# Patient Record
Sex: Male | Born: 1955
Health system: Southern US, Community
[De-identification: ages and names within clinical notes are randomized; demographics above are authoritative.]

## PROBLEM LIST (undated history)

## (undated) DIAGNOSIS — I2510 Atherosclerotic heart disease of native coronary artery without angina pectoris: Secondary | ICD-10-CM

## (undated) DIAGNOSIS — G8929 Other chronic pain: Secondary | ICD-10-CM

## (undated) DIAGNOSIS — G43109 Migraine with aura, not intractable, without status migrainosus: Principal | ICD-10-CM

## (undated) DIAGNOSIS — F419 Anxiety disorder, unspecified: Secondary | ICD-10-CM

## (undated) DIAGNOSIS — M545 Low back pain, unspecified: Secondary | ICD-10-CM

## (undated) DIAGNOSIS — I255 Ischemic cardiomyopathy: Secondary | ICD-10-CM

## (undated) DIAGNOSIS — I10 Essential (primary) hypertension: Secondary | ICD-10-CM

## (undated) HISTORY — DX: Essential (primary) hypertension: I10

## (undated) HISTORY — DX: Atherosclerotic heart disease of native coronary artery without angina pectoris: I25.10

## (undated) HISTORY — DX: Other chronic pain: G89.29

## (undated) HISTORY — DX: Low back pain, unspecified: M54.50

## (undated) HISTORY — DX: Low back pain: M54.5

## (undated) HISTORY — DX: Anxiety disorder, unspecified: F41.9

## (undated) HISTORY — PX: BRAIN SURGERY: SHX531

## (undated) HISTORY — DX: Ischemic cardiomyopathy: I25.5

## (undated) HISTORY — PX: OTHER SURGICAL HISTORY: SHX169

## (undated) HISTORY — DX: Migraine with aura, not intractable, without status migrainosus: G43.109

## (undated) HISTORY — PX: SPINAL CORD STIMULATOR IMPLANT: SHX2422

---

## 2001-02-01 ENCOUNTER — Ambulatory Visit (HOSPITAL_COMMUNITY): Admission: RE | Admit: 2001-02-01 | Discharge: 2001-02-01 | Payer: Self-pay | Admitting: Neurosurgery

## 2001-02-01 ENCOUNTER — Encounter: Payer: Self-pay | Admitting: Neurosurgery

## 2001-02-24 ENCOUNTER — Encounter: Payer: Self-pay | Admitting: Neurosurgery

## 2001-02-24 ENCOUNTER — Ambulatory Visit (HOSPITAL_COMMUNITY): Admission: RE | Admit: 2001-02-24 | Discharge: 2001-02-24 | Payer: Self-pay | Admitting: Neurosurgery

## 2001-03-10 ENCOUNTER — Encounter: Payer: Self-pay | Admitting: Neurosurgery

## 2001-03-10 ENCOUNTER — Ambulatory Visit (HOSPITAL_COMMUNITY): Admission: RE | Admit: 2001-03-10 | Discharge: 2001-03-10 | Payer: Self-pay | Admitting: Neurosurgery

## 2001-03-23 ENCOUNTER — Encounter: Payer: Self-pay | Admitting: Neurosurgery

## 2001-03-23 ENCOUNTER — Ambulatory Visit (HOSPITAL_COMMUNITY): Admission: RE | Admit: 2001-03-23 | Discharge: 2001-03-23 | Payer: Self-pay | Admitting: Neurosurgery

## 2001-03-29 ENCOUNTER — Ambulatory Visit (HOSPITAL_COMMUNITY): Admission: RE | Admit: 2001-03-29 | Discharge: 2001-03-30 | Payer: Self-pay | Admitting: Neurosurgery

## 2001-04-07 ENCOUNTER — Encounter: Payer: Self-pay | Admitting: Neurosurgery

## 2001-04-07 ENCOUNTER — Ambulatory Visit (HOSPITAL_COMMUNITY): Admission: RE | Admit: 2001-04-07 | Discharge: 2001-04-08 | Payer: Self-pay | Admitting: Neurosurgery

## 2001-04-29 ENCOUNTER — Ambulatory Visit (HOSPITAL_COMMUNITY): Admission: RE | Admit: 2001-04-29 | Discharge: 2001-04-29 | Payer: Self-pay | Admitting: Neurosurgery

## 2001-04-29 ENCOUNTER — Encounter: Payer: Self-pay | Admitting: Neurosurgery

## 2001-05-09 ENCOUNTER — Emergency Department (HOSPITAL_COMMUNITY): Admission: EM | Admit: 2001-05-09 | Discharge: 2001-05-09 | Payer: Self-pay | Admitting: Emergency Medicine

## 2001-05-18 ENCOUNTER — Encounter: Payer: Self-pay | Admitting: Neurosurgery

## 2001-05-18 ENCOUNTER — Inpatient Hospital Stay (HOSPITAL_COMMUNITY): Admission: RE | Admit: 2001-05-18 | Discharge: 2001-05-19 | Payer: Self-pay | Admitting: Neurosurgery

## 2001-09-01 ENCOUNTER — Encounter (HOSPITAL_COMMUNITY): Admission: RE | Admit: 2001-09-01 | Discharge: 2001-10-01 | Payer: Self-pay | Admitting: Neurosurgery

## 2001-09-04 ENCOUNTER — Ambulatory Visit (HOSPITAL_COMMUNITY): Admission: RE | Admit: 2001-09-04 | Discharge: 2001-09-04 | Payer: Self-pay | Admitting: Neurosurgery

## 2001-09-04 ENCOUNTER — Encounter: Payer: Self-pay | Admitting: Neurosurgery

## 2001-10-01 ENCOUNTER — Encounter (HOSPITAL_COMMUNITY): Admission: RE | Admit: 2001-10-01 | Discharge: 2001-10-31 | Payer: Self-pay | Admitting: Neurosurgery

## 2002-01-18 ENCOUNTER — Encounter: Payer: Self-pay | Admitting: Family Medicine

## 2002-01-18 ENCOUNTER — Ambulatory Visit (HOSPITAL_COMMUNITY): Admission: RE | Admit: 2002-01-18 | Discharge: 2002-01-18 | Payer: Self-pay | Admitting: Family Medicine

## 2002-02-10 ENCOUNTER — Encounter: Payer: Self-pay | Admitting: Emergency Medicine

## 2002-02-10 ENCOUNTER — Emergency Department (HOSPITAL_COMMUNITY): Admission: EM | Admit: 2002-02-10 | Discharge: 2002-02-10 | Payer: Self-pay | Admitting: Emergency Medicine

## 2002-04-19 ENCOUNTER — Encounter: Payer: Self-pay | Admitting: Anesthesiology

## 2002-04-19 ENCOUNTER — Ambulatory Visit (HOSPITAL_COMMUNITY): Admission: RE | Admit: 2002-04-19 | Discharge: 2002-04-19 | Payer: Self-pay | Admitting: Anesthesiology

## 2002-12-06 ENCOUNTER — Ambulatory Visit (HOSPITAL_COMMUNITY): Admission: RE | Admit: 2002-12-06 | Discharge: 2002-12-06 | Payer: Self-pay | Admitting: Anesthesiology

## 2002-12-06 ENCOUNTER — Encounter: Payer: Self-pay | Admitting: Anesthesiology

## 2004-04-18 ENCOUNTER — Ambulatory Visit (HOSPITAL_COMMUNITY): Admission: RE | Admit: 2004-04-18 | Discharge: 2004-04-18 | Payer: Self-pay | Admitting: Anesthesiology

## 2006-09-09 ENCOUNTER — Ambulatory Visit (HOSPITAL_COMMUNITY): Admission: RE | Admit: 2006-09-09 | Discharge: 2006-09-09 | Payer: Self-pay | Admitting: Family Medicine

## 2007-10-06 ENCOUNTER — Inpatient Hospital Stay (HOSPITAL_COMMUNITY): Admission: EM | Admit: 2007-10-06 | Discharge: 2007-10-09 | Payer: Self-pay | Admitting: Emergency Medicine

## 2007-10-15 ENCOUNTER — Ambulatory Visit (HOSPITAL_COMMUNITY): Admission: RE | Admit: 2007-10-15 | Discharge: 2007-10-15 | Payer: Self-pay | Admitting: Family Medicine

## 2007-10-16 ENCOUNTER — Emergency Department (HOSPITAL_COMMUNITY): Admission: EM | Admit: 2007-10-16 | Discharge: 2007-10-16 | Payer: Self-pay | Admitting: Emergency Medicine

## 2007-10-17 ENCOUNTER — Emergency Department (HOSPITAL_COMMUNITY): Admission: EM | Admit: 2007-10-17 | Discharge: 2007-10-17 | Payer: Self-pay | Admitting: Emergency Medicine

## 2008-02-02 ENCOUNTER — Emergency Department (HOSPITAL_COMMUNITY): Admission: EM | Admit: 2008-02-02 | Discharge: 2008-02-02 | Payer: Self-pay | Admitting: Emergency Medicine

## 2008-02-05 ENCOUNTER — Emergency Department (HOSPITAL_COMMUNITY): Admission: EM | Admit: 2008-02-05 | Discharge: 2008-02-05 | Payer: Self-pay | Admitting: Emergency Medicine

## 2008-06-05 ENCOUNTER — Ambulatory Visit (HOSPITAL_COMMUNITY): Admission: RE | Admit: 2008-06-05 | Discharge: 2008-06-05 | Payer: Self-pay | Admitting: General Surgery

## 2009-07-23 ENCOUNTER — Encounter (HOSPITAL_COMMUNITY): Admission: RE | Admit: 2009-07-23 | Discharge: 2009-08-22 | Payer: Self-pay | Admitting: Anesthesiology

## 2009-08-31 ENCOUNTER — Ambulatory Visit (HOSPITAL_COMMUNITY): Admission: RE | Admit: 2009-08-31 | Discharge: 2009-08-31 | Payer: Self-pay | Admitting: Anesthesiology

## 2009-11-14 HISTORY — PX: COLONOSCOPY: SHX174

## 2010-11-04 ENCOUNTER — Ambulatory Visit (HOSPITAL_COMMUNITY): Admission: RE | Admit: 2010-11-04 | Discharge: 2010-11-04 | Payer: Self-pay | Admitting: Neurosurgery

## 2011-02-25 LAB — CREATININE, SERUM
Creatinine, Ser: 0.94 mg/dL (ref 0.4–1.5)
GFR calc non Af Amer: >60 mL/min (ref >60)

## 2011-04-29 NOTE — Procedures (Signed)
NAME:  Jim Martin, Jim Martin                ACCOUNT NO.:  [id number]   MEDICAL RECORD NO.:  [id number]          PATIENT TYPE:  INP   LOCATION:  A201                          FACILITY:  [facility]   PHYSICIAN:  Kofi A. Gerilyn Pilgrim, M.D. DATE OF BIRTH:  May 25, 1956   DATE OF PROCEDURE:  DATE OF DISCHARGE:  10/09/2007                              EEG INTERPRETATION   55 year old man who presents with a spell of unresponsiveness,  suspicious for unwitnessed seizure.   MEDICATIONS:  Sodium chloride, Rocephin, Zithromax, Vicodin, Percocet.   ANALYSIS:  A 16-channel recording is conduced for 22 minutes.  There is  a well-formed posterior rhythm of 8 Hz which attenuates with eye-  opening.  There is beta activity observed in the frontal areas.  Sleep  and drowsy activities are recorded.  Photic stimulation is not  conducted, but hyperventilation is, and shows no significant changes in  the background activity.  There is no focal or lateralized slowing.  There is no epileptiform activity observed.   IMPRESSION:  This is a normal recording of the wake and drowsy states.      Kofi A. Gerilyn Pilgrim, M.D.  Electronically Signed     KAD/MEDQ  D:  10/11/2007  T:  10/11/2007  Job:  [id number]

## 2011-04-29 NOTE — Group Therapy Note (Signed)
NAME:  JANTZ, MAIN                ACCOUNT NO.:  [id number]   MEDICAL RECORD NO.:  [id number]          PATIENT TYPE:  INP   LOCATION:  A201                          FACILITY:  [facility]   PHYSICIAN:  Angus G. Renard Matter, MD   DATE OF BIRTH:  [birth year]   DATE OF PROCEDURE:  DATE OF DISCHARGE:                                 PROGRESS NOTE   This patient is felt to have bronchopneumonia with a history of  hypothermia, history of leukocytosis.  His condition remains stable.   OBJECTIVE:  VITAL SIGNS:  Blood pressure 114/57, respiration 24, pulse  70, temperature 97.4.  Lab data, CBC WBC 9200, hemoglobin 12.1,  hematocrit 35.1.  Chemistries otherwise normal with the exception of  slight elevation of glucose 134.  Chest x-ray on admission showed  elevation right diaphragm, probable infiltrate.  MRI of brain showed  evidence of acute left maxillary sinusitis.  HEENT:  Negative.  LUNGS:  Clear.  HEART:  Regular rhythm.  ABDOMEN:  No palpable organs or masses.   ASSESSMENT:  The patient was admitted with altered mental status.  Was  found to have possible pneumonia with elevated white count.   PLAN:  To continue current regimen.  The patient is scheduled for EEG  ordered by Dr. Gerilyn Pilgrim.  Will repeat chest x-ray.      Angus G. Renard Matter, MD  Electronically Signed     AGM/MEDQ  D:  10/08/2007  T:  10/08/2007  Job:  [id number]

## 2011-04-29 NOTE — Consult Note (Signed)
NAME:  Jim Martin, Jim Martin                ACCOUNT NO.:  [id number]   MEDICAL RECORD NO.:  [id number]          PATIENT TYPE:  INP   LOCATION:  A201                          FACILITY:  [facility]   PHYSICIAN:  Kofi A. Gerilyn Pilgrim, M.D. DATE OF BIRTH:  Jan 27, 1956   DATE OF CONSULTATION:  10/07/2007  DATE OF DISCHARGE:                                 CONSULTATION   REASON FOR CONSULTATION:  Altered mental status.   HISTORY:  This is a 55 year old white male who is known to our clinic  for low back pain.  He was found unresponsive and unconscious on his  porch and was admitted to the hospital.  He is noted to have a  pneumonia.  The patient is back to baseline and indicates that he did  not take more of his medication than prescribed, although, this has not  been independently verified.  He indicates that he was out doing some  work around the yard and had increased back pain.  He went to sit down  and when he woke up he was in the hospital.  He was found unresponsive  and unconscious by his brother.  He denies again taking more medication  than prescribed.  The patient has been followed in the clinic for the  last several months.  He has been on MS Contin 30 mg b.i.d. and Lortab  7.5 mg t.i.d. p.r.n.  He has been on this dose since June 12, when the  hydrocodone was increased from b.i.d. dosing to t.i.d. dosing.  Although  home medications are Naproxen, Lexapro, and Xanax.   PAST MEDICAL HISTORY:  Significant for hypertension, lumbar degenerative  disk disease, lumbar spinal leak after laminectomy surgery.   PAST SURGICAL HISTORY:  Right L4-5 laminotomy and foraminotomy, also  right L4-5 laminectomy and repair of dural leak.   MEDICATIONS:  1. Xanax 1 mg daily.  2. MS Contin 30 mg b.i.d.  3. Lortab 7.5 mg t.i.d. p.r.n.  4. Lexapro 20 mg daily.   ALLERGIES:  No known drug allergies.   FAMILY HISTORY:  Significant for coronary artery disease.   SOCIAL HISTORY:  She smokes a pack of cigarettes  per day.  No alcohol or  illicit drug use.  He lives with his two children who he apparently has  primary responsibility for.   REVIEW OF SYSTEMS:  Significant for history of present illness,  otherwise unrevealing.  No reports of headaches or focal neurological  symptoms.  The patient does report having episode leg weakness which  seems to be associated with breakthrough pain in the low back region.   PHYSICAL EXAMINATION:  VITAL SIGNS:  Temperature 97.1, pulse 79,  respirations 18, blood pressure 103/57.  HEENT:  Neck is supple.  Head is normocephalic and atraumatic.  ABDOMEN:  Soft.  EXTREMITIES:  No edema.  MENTATION:  The patient is awake and alert.  He converses well.  Speech,  language, and cognition are intact.  CRANIAL NERVES:  Pupils equal, round, and reactive to light and  accommodation.  The pupil size is 5 mm.  The extraocular movements are  full,  although the patient does have significant sustained nystagmus  bilaterally.  In reviewing examination notes from the office, this does  not appear to be present on previous examinations.  MOTOR:  Shows normal tone, bulk, and strength.  There is no pronator  drift.  Coordination shows no dysmetria, there are no tremors,  bradykinesia, or pass pointing.  Deep tendon reflexes are brisk in the  legs with equivocal plantar reflexes.  Sensation is normal to  temperature and light touch.   Head CT scan is normal.   LABORATORY DATA:  WBC 27, hemoglobin 14.8, platelet count 250.  Differential; 92% neutrophils.  Urinalysis; 0-2 RBC's, 0-2 WBC's,  nitrites negative, leukocytes negative, few bacteria.  Acetaminophen,  alcohol, and aspirin levels are undetected.  Urine drug screen is  positive for metabolite of opioids.   ASSESSMENT:  Acute encephalopathy which seemed to have resolved  spontaneously.  Differential diagnosis includes drug overdose  unintentional given the sustained nystagmus on examination.  Given the  rapid recovery,  seizures should also be considered.   RECOMMENDATIONS:  1. EEG.  I agree with MRI of the brain.  2. The patient should have a pill count of his medications.  They were      prescribed on the last visit, which was October 8.  Further      disposition will depend upon the evaluation.  Thanks for this      consultation.      Kofi A. Gerilyn Pilgrim, M.D.  Electronically Signed     KAD/MEDQ  D:  10/07/2007  T:  10/07/2007  Job:  [id number]

## 2011-04-29 NOTE — Group Therapy Note (Signed)
NAME:  Jim Martin, Jim Martin                ACCOUNT NO.:  [id number]   MEDICAL RECORD NO.:  [id number]          PATIENT TYPE:  INP   LOCATION:  A201                          FACILITY:  [facility]   PHYSICIAN:  Angus G. Renard Matter, MD   DATE OF BIRTH:  1956-01-17   DATE OF PROCEDURE:  DATE OF DISCHARGE:  10/09/2007                                 PROGRESS NOTE   This patient was felt to have bronchopneumonia.  Does have of note  evidence of hypothermia, a leukocytosis and encephalopathy.  There was  some concern that this might have been secondary to medications.  His  initial CBC showed a white count of 27,300 with hemoglobin 14.8,  hematocrit of 43.7.  The most recent CBC, WBC 9200 with hemoglobin 12.1  hematocrit 35.1.  Blood cultures have been negative. A repeat chest x-  ray shows improvement and partial clearing of the right lung base,  evidence of bronchitic chain changes.   OBJECTIVE:  VITAL SIGNS:  Blood pressure 135/84, respiration 20, pulse  65, temperature 97.6. HEART:  Regular rhythm  LUNGS:  Clear.  ABDOMEN:  No palpable organs or masses   ASSESSMENT:  The patient was admitted with above-stated problems. An EEG  was ordered by Dr. Gerilyn Pilgrim. If the diagnostic test has been completed,  we can consider discharge.      Angus G. Renard Matter, MD  Electronically Signed     AGM/MEDQ  D:  10/09/2007  T:  10/10/2007  Job:  [id number]

## 2011-04-29 NOTE — H&P (Signed)
NAME:  Jim Martin, Jim Martin                ACCOUNT NO.:  [id number]   MEDICAL RECORD NO.:  [id number]          PATIENT TYPE:  INP   LOCATION:  A201                          FACILITY:  [facility]   PHYSICIAN:  Angus G. Renard Matter, MD   DATE OF BIRTH:  1956/09/05   DATE OF ADMISSION:  10/06/2007  DATE OF DISCHARGE:  LH                              HISTORY & PHYSICAL   This patient was brought in through the emergency department with  altered mental status and was evaluated by ED physician and found to  have evidence of pneumonia.  The patient had been sitting on his front  porch, was not responding, was given Narcan in the ambulance on the way  to the ER, did improve some.  He had been cold all day.  Apparently had  some difficulty breathing and was given CPR prior to admission.  He is  being treated for possible MS, although no definite diagnosis has been  made.   Laboratory studies done:  CBC - WBC 27,300 with hemoglobin of 14.8,  hematocrit 43.7.  X-ray showed elevated right diaphragm, infiltrate in  right lower lobe.  Drug screen positive for opiates.  Chemistries:  Sodium 140, potassium 4.3, chloride 105, CO2 30, glucose 134, BUN 15,  creatinine 1.29.  CT of the head showed no intracranial abnormalities.  The patient was admitted.   SOCIAL HISTORY:  The patient does smoke cigarettes, does not drink or  use drugs.   FAMILY HISTORY:  Unknown.   PAST MEDICAL HISTORY:  1. History of hypertension.  2. Arthritis.  3. Chronic back pain.   MEDICATION LIST:  Morphine hydrocodone.   REVIEW OF SYSTEMS:  HEENT:  Negative.  CARDIOPULMONARY:  Negative.  No  cough or dyspnea.  GASTROINTESTINAL:  No gastrointestinal bleeding,  diarrhea, constipation.  GENITOURINARY:  No dysuria or hematuria.  NEUROLOGICAL:  The patient had mental confusion.   EXAMINATION:  Alert male with blood pressure 110/68, pulse 88,  respirations 18, temperature 96.1.  HEENT:  Eyes: PERRLA.  TMs negative.  Oropharynx benign.  NECK:  Supple.  No JVD or thyroid abnormalities.  HEART:  Regular rhythm, no murmurs.  ABDOMEN:  No palpable organs or masses.  No organomegaly.  EXTREMITIES:  Free of edema.  NEUROLOGIC: No focal deficit.   ASSESSMENT:  Patient was admitted with bronchopneumonia, leukocytosis,  history of hypothermia.      Angus G. Renard Matter, MD  Electronically Signed     AGM/MEDQ  D:  10/07/2007  T:  10/07/2007  Job:  [id number]

## 2011-04-29 NOTE — Group Therapy Note (Signed)
NAME:  Jim Martin, Jim Martin                ACCOUNT NO.:  [id number]   MEDICAL RECORD NO.:  [id number]          PATIENT TYPE:  INP   LOCATION:  A201                          FACILITY:  [facility]   PHYSICIAN:  Kofi A. Gerilyn Pilgrim, M.D. DATE OF BIRTH:  [birth year]   DATE OF PROCEDURE:  10/08/2007  DATE OF DISCHARGE:                                 PROGRESS NOTE   SUBJECTIVE:  The patient complains of pain being 11/10, quite severe.  We were able to do a pill count and the patient got his medication,  hydrocodone filled on the 9th, but only has 17 left over, which  indicates that the patient was obviously taking more than prescribed or  that the medications were stolen or lost.  The morphine and other  medications are appropriate for the date written.  The patient has been  started on Percocet.   OBJECTIVE:  VITAL SIGNS:  On exam today temperature is 97.3, pulse 52,  respirations 20, blood pressure 154/77.  GENERAL APPEARANCE:  The patient is alert and in no acute distress.  He  does appear to be in significant discomfort from pain.   ASSESSMENT/PLAN:  Unresponsiveness, likely due to the patient taking too  much medication.  When confronted with the evidence of his pill count,  the patient did admit to taking more of the hydrocodone.  The patient  was strictly warned against doing this and warned about the potential  complications such as overdose, death.  We will discontinue all the  short-acting pain medications and replace them with Ultram.  We will go  ahead and restart the long-acting MS Contin.  An EEG has been ordered  but so far has not been done.  The results will be followed up.  It is a  possibility that this could be done in the outpatient setting.      Kofi A. Gerilyn Pilgrim, M.D.  Electronically Signed     KAD/MEDQ  D:  10/08/2007  T:  10/08/2007  Job:  [id number]

## 2011-05-02 NOTE — Discharge Summary (Signed)
NAME:  Jim Martin, Jim Martin                ACCOUNT NO.:  [id number]   MEDICAL RECORD NO.:  [id number]          PATIENT TYPE:  INP   LOCATION:  A201                          FACILITY:  [facility]   PHYSICIAN:  Angus G. Renard Matter, MD   DATE OF BIRTH:  1956-09-02   DATE OF ADMISSION:  10/06/2007  DATE OF DISCHARGE:  10/25/2008LH                               DISCHARGE SUMMARY   A 55 year old white male admitted October 06, 2007, discharged October 09, 2007, three days' hospitalization.   DIAGNOSES:  1. Bronchopneumonia  2. Leukocytosis  3. Hypothermia.   Patient's condition is stable at time of his discharge.   The patient was brought to the emergency apartment with altered mental  status, was evaluated by ED physician and found to have evidence of  pneumonia.  The patient had been sitting on his front porch and was not  responding.  He was given Narcan in the ambulance on the way to the ED  and did improve some. He had been cold all day, apparently had some  difficulty breathing. Was given CPR prior to admission.  He is being  treated for possible MS as well, although no definite diagnosis has been  made.   PHYSICAL EXAMINATION:  GENERAL:  Alert white male.  VITAL SIGNS:  Blood pressure 110/68, pulse 88, respiration 18,  temperature 96.1.  HEENT:  Eyes: PERRLA.  TMs negative.  Oropharynx benign.  NECK:  Supple.  No JVD or thyroid abnormalities.  HEART:  Regular rhythm, no murmurs.  ABDOMEN:  No palpable organs or masses.  No organomegaly.  EXTREMITIES:  Free of edema.   LABORATORY DATA:  Admission CBC:  WBC 27,300 with hemoglobin 14.8,  hematocrit 43.7,  92 neutrophils, 25.2 absolute neutrophils, 6  lymphocytes.  Chemistries:  Sodium 140, potassium 4.3, chloride 105, CO2  30, glucose 134, BUN 15, creatinine 1.29.  Liver enzymes: SGOT 3.8, SGPT  35, alkaline phosphatase 25. Urine drug screen negative.  Urinalysis  negative.  Blood culture negative over a 2-day period.   Chest x-ray:  Elevation of right diaphragm with atelectasis versus  infiltrate right medial lower lobe.   CT of head:  No acute intracranial abnormalities.  Left maxillary  sinusitis.   MRI of brain without contrast:  Acute left maxillary sinusitis.   Subsequent x-ray of chest showed partial clearing   HOSPITAL COURSE:  The patient, at the time of his admission, was placed  on a regular diet, [facility] standing orders.  MRI of the head was  scheduled.  Was placed on IV half-normal saline at 125 mL an hour, IV  Rocephin 1 gram daily, Zithromax 500 mg daily, Lortab 7.5/500 every 6  hours p.r.n. for pain.  The patient was seen in consultation by Dr.  Gerilyn Pilgrim.  EEG was ordered.  The patient was placed on MS Contin 30 mg  b.i.d. by Dr. Gerilyn Pilgrim.  Subsequent x-ray of chest was negative.   Neurologist felt he could have had an overdose of medication and  encephalopathy secondary to this.  At any rate, his chest x-ray  improved, and he was able to  be discharged after three days'  hospitalization. He was discharged on:  1. Levaquin 750 mg daily.  2. Mucinex 1200 mg b.i.d.  3. MS Contin 30 mg b.i.d.  4. Ultram 100 mg every 6 hours.  5. Naproxen 500 mg daily.      Angus G. Renard Matter, MD  Electronically Signed     AGM/MEDQ  D:  10/19/2007  T:  10/19/2007  Job:  [id number]

## 2011-05-02 NOTE — Op Note (Signed)
[place]. [facility]  Patient:    Jim Martin, Jim Martin                       MRN: [id number] Proc. Date: 05/18/01 Adm. Date:  [id number] Attending:  Barton Fanny                           Operative Report  PREOPERATIVE DIAGNOSIS:  Delayed postlaminotomy cerebrospinal fluid leak.  POSTOPERATIVE DIAGNOSIS:  Delayed postlaminotomy cerebrospinal fluid leak.  OPERATION PERFORMED:  Right L4-5 lumbar laminotomy and repair of dural defect.  SURGEON:  Hewitt Shorts, M.D.  ASSISTANT:  ANESTHESIA:  General endotracheal.  INDICATIONS FOR PROCEDURE:  The patient is a 55 year old man who is six weeks status post right L4-5 lumbar laminotomy and foraminotomy.  He did well initially following surgery.  Subsequently developed some intermittent mild headache as well as subcutaneous fluid collection underlying his lumbar incision which healed well.  Symptoms continued and were not improving despite periods of bedrest and reduction of overall level of activity and a decision was made to proceed with exploration of his wound and repair of CSF leak.  DESCRIPTION OF PROCEDURE:  The patient was brought to the operating room and placed under general endotracheal anesthesia.  The patient was turn to a prone position and the lumbar region was prepped with Betadine soap and solution and draped in sterile fashion.  The previous midline incision was reopened. Dissection was carried down through the pseudomeningocele and through the opening within the previous fascial closure.  The microscope was draped and brought into the field to provide additional magnification, illumination and visualization.  The remainder of the procedure was performed using microdissection and microsurgical technique.  The previous right L4-5 lumbar laminotomy was exposed, granulation tissue within it was carefully removed and the underlying thecal sac was identified.  It was found that the dural  defect was underlying the superior edge of the laminotomy and therefore the laminotomy was extended rostrally using the [facility] Max drill and Kerrison punches.  We were able to define the defect which was approximately 2 to 2.5 mm in length.  A few small nerve rootlets were gently eased back into the dural tube and then the dural opening was closed with a figure of eight 6-0 Prolene suture.  A good closure was achieved, a Valsalva revealed no cerebrospinal fluid leakage.  We therefore went ahead and harvested a piece of paraspinal muscle which was macerated and placed over the dura repair and then this was sealed over with Tisseal.  We then proceeded with closure.  The deep fascia was closed with interrupted undyed 0 Vicryl sutures.  The subcutaneous and subcuticular layer were closed with interrupted inverted 2-0 undyed Vicryl sutures and the skin edges reapproximated with Dermabond.  The patient tolerated the procedure well.  Estimated blood loss for this procedure was less than 25 cc.  Sponge, needle and instrument counts were correct. DD:  05/18/01 TD:  05/19/01 Job: [id number]

## 2011-05-02 NOTE — Op Note (Signed)
[place]. [facility]  Patient:    Jim Martin, Jim Martin                         MRN: [id number] Proc. Date: 04/07/01 Adm. Date:  [id number] Attending:  Barton Fanny                           Operative Report  PREOPERATIVE DIAGNOSIS:  Lumbar spondylosis and radiculopathy.  POSTOPERATIVE DIAGNOSIS:  Lumbar spondylosis and radiculopathy.  PROCEDURE:  Right L4-5 lumbar laminotomy and foraminotomy.  SURGEON:  Hewitt Shorts, M.D.  ASSISTANT:  Danae Orleans. Venetia Maxon, M.D.  ANESTHESIA:  General endotracheal.  INDICATION:  This is a 55 year old man who presented with right lumbar radiculopathy and was found to have lumbar spondylosis with hypertrophic facet arthropathy causing dorsolateral right L5 nerve root compression.  Decision was made to proceed with lumbar laminotomy and foraminotomy.  DESCRIPTION OF PROCEDURE:  Patient brought to the operating room and placed under general endotracheal anesthesia.  The patient was turned to a prone position, and the lumbar region was prepped with Duraprep and draped in a sterile fashion.  A localizing x-ray was taken and the L4-5 level identified. The midline was infiltrated with local anesthetic with epinephrine and a midline incision made, carried down through the subcutaneous tissue.  Bipolar cautery and electrocautery were used to maintain hemostasis.  Dissection was carried down to the lumbar fascia, which was incised to the right of the midline, and the paraspinous muscles were dissected and the spinous processes and laminae in subperiosteal fashion.  Another localizing x-ray was taken. The L4-5 interlaminar space was identified.  The microscope was draped and brought into the field to provide additional magnification and illumination and visualization, and the remainder of the procedure was performed using microdissection and microsurgical technique.  A laminotomy was performed using the [facility] Max drill and  Kerrison punches.  The ligamentum flavum was thickened and was carefully removed, and we exposed the thecal sac and right L5 nerve root.  A foraminotomy was performed for the right L5 nerve root and overgrown facet and ligament tissue was removed, and the thecal sac and nerve root were decompressed.  The underlying L4-5 disk was examined.  It was degenerated, but there was no disk herniation.  It was felt that good decompression of the thecal sac and nerve root had been achieved.  The wound was irrigated with bacitracin solution, checked for hemostasis, which was established with bipolar cautery, and then 2 cc of fentanyl and 80 mg of Depo-Medrol were infused into the epidural space, and then we proceeded with closure.  The deep fascia was closed with interrupted, undyed 1 Vicryl sutures, the subcutaneous and subcuticular layer were closed with interrupted, inverted 2-0 undyed Vicryl sutures, and the skin edges were closed with Dermabond.  The patient tolerated the procedure well.  The estimated blood loss was 25 cc.  Sponge and needle count were correct.  Following surgery, the patient is to be turned back to a supine position and reversed from anesthetic, extubated, and transferred to the recovery room for further care. DD:  04/07/01 TD:  04/07/01 Job: [id number]

## 2011-05-02 NOTE — H&P (Signed)
[place]  Patient:    Jim Martin, Jim Martin                       MRN: [id number] Adm. Date:  [id number] Attending:  Barton Fanny                         History and Physical  CHIEF COMPLAINT/HISTORY OF PRESENT ILLNESS: The patient is a 55 year old left-handed white male, who is about six weeks status post a right L4-5 lumbar laminotomy and foraminotomy.  Following surgery he initially had some mild radicular discomfort but then a week or two after surgery began to develop some headaches.  Notably, he had been studied prior to surgery with a myelogram and developed a post myelogram headache and had been scheduled for surgery, which needed to be postponed.  He underwent a blood patch, which successfully resolved the post myelogram headache, and he subsequently underwent surgery a week later.  Also, he notably had prior to his myelogram a series of three epidural steroid injections as well as a facet injury.  A few days after developing the headache he began to notice some mild crossing of his eyes, which subsequently worsened.  We evaluated the patient and he was found to have a partial left sixth nerve palsy.  He denied diplopia with central and right lateral gaze and, in fact, did not notice diplopia with left lateral gaze but finds it hard to maintain left lateral gaze.  In examining his incision there was a subcutaneous fluid collection.  The patient was studied with MRI of the lumbar spine, which showed a connection between the subcutaneous fluid collection and the laminotomy with a small defect in the lumbodorsal fascia consistent with a dural defect and CSF leak.  The patient was treated with bed rest and I have been him several times in the office.  He has had some intermittent mild headache.  However, he has noticed that the pseudomeningocele certainly has not resolved and if anything is slightly increased in size.  He finds that it  tends to decrease in size when he is recumbent and increase in size when he is up and about, but even when he is up and about he only occasionally has headache.  Nevertheless, because of the persistent CSF leak the patient is admitted to the hospital for exploration of his wound and repair of the CSF leak.  Notably, the patient did undergo ophthalmic evaluation by Dr. Theone Murdoch at [facility], and his left sixth nerve palsy has steadily improved and gaze is nearly conjugate, and he rarely has any diplopia.  Dr. Daphine Deutscher expects the sixth nerve palsy to continue to resolve.  PAST MEDICAL/SURGICAL HISTORY/FAMILY HISTORY/SOCIAL HISTORY: Without change from his admission note of April 07, 2001.  REVIEW OF SYSTEMS: Notable for that described in his History of Present Illness and Past Medical History.  He also has had a bit of mucus production over the past weekend from the chest.  He denies any chest pain, significant increase in cough, or really much in the way of mucus production over the past 1-1/2 days.  He has had no fever and he has no fever at this time.  He has normal WBCs.  PHYSICAL EXAMINATION:  GENERAL: The patient is a well-developed, well-nourished white male in no acute distress.  VITAL SIGNS: Temperature 97.8 degrees, pulse 98, blood pressure 91/84, respiratory rate 16.  Height  5 feet 7 inches.  Weight 162 pounds.  LUNGS: Clear to auscultation with symmetric respiratory excursion.  HEART: Regular rate and rhythm, normal S1 and S2, no murmur.  ABDOMEN: Soft, nontender, bowel sounds present.  EXTREMITIES: No clubbing, cyanosis, or edema.  MUSCULOSKELETAL: Well-healed lumbar incision.  There was a subcutaneous fluid collection present consistent with pseudomeningocele.  NEUROLOGIC: Strength 5/5 in lower extremities.  Sensation intact to pinprick. Reflexes 1-2 and symmetric bilaterally.  Toes are downgoing bilaterally.  IMPRESSION: Delayed post  laminotomy cerebrospinal fluid leak associated with transient partial left sixth nerve palsy, which is now nearly fully resolved.  PLAN: The patient is to be admitted for exploration of his wound and repair of his CSF leak.  We discussed this and the nature of the surgical procedure, alternatives to surgery, typical length of surgery and hospital stay and overall recuperation, limitations postoperatively, and the risks of surgery including risk of infection, bleeding, nerve root dysfunction, recurrence of CSF leak and possible need for further surgery, and anesthetic risks.  He also was spoken with directly about the mild bronchitic condition that he has.  It was explained to him that with general anesthesia there is an increased risk of pneumonia and that if he were to have significant coughing it could disrupt the repair of his dural defect and require further surgery.  He feels that he really has had little in the way of productive cough over the past 1-1/2 days and that it was a few days ago that he brought up any mucus at all and it really was not that substantial, and he understand these risks of surgery and wants to go ahead with surgery at this time.  Understanding that the patient would like to go ahead with surgery he is admitted for such. DD:  05/18/01 TD:  05/19/01 Job: [id number]

## 2011-05-02 NOTE — H&P (Signed)
[place]  Patient:    Jim Martin, Jim Martin                         MRN: [id number] Adm. Date:  [id number] Attending:  Barton Fanny CC:         Hewitt Shorts, M.D. (office)   History and Physical  CHIEF COMPLAINT: The patient is a 55 year old left-handed white male, who was evaluated for right lumbar radiculopathy.  HISTORY OF PRESENT ILLNESS: He has been having intermittent back pain which has steadily worsened.  The pain has been across his low back, more to the right than to the left, with pain into the right buttock and posterior thigh. He does not describe any weakness, numbness, or paresthesias.  He has been treated with NSAIDs, muscle relaxants and analgesics without relief.  He was studied with an MRI scan and myelogram and post myelogram CT scan, and he as undergone a series of three epidural steroid injections, as well as a right L4-5 facet injection.  Each of these had partial transient beneficial effect but no lasting benefit.  He has continued to work but finds it difficult standing on concrete throughout the day, bending and lifting.  Myelogram and post myelogram CT scan shows nonfilling of the right L5 nerve root, primarily due to hypertrophic spondylitic spurring from the right L4-5 facet complex.  There is mild disk bulging at that level but the compression is more dorsal/lateral than ventral, and the patient is admitted now for right L4-5 lumbar laminotomy and foraminotomy.  Notably, following his myelogram he developed post myelogram syndrome, which responded well to a blood patch done last week (nine days ago).  PAST MEDICAL HISTORY: There is no history of hypertension, myocardial infarction, cancer, stroke, diabetes, peptic ulcer disease, or lung disease. PAST SURGICAL HISTORY: No previous surgeries.  ALLERGIES: No known drug allergies.  CURRENT MEDICATIONS:  1. Skelaxin.  2. Tylenol #3.  3. Vioxx.  FAMILY  HISTORY: His father died at the age of 77 of heart disease.  His mother is in poor health at age 74 with arthritis and lung disease - she is wheelchair-bound.  SOCIAL HISTORY: The patient is separated from his wife.  He smoked for five or six years in his early life but restarted smoking about eight months ago.  He does not drink alcoholic beverages.  He denies history of substance abuse.  REVIEW OF SYSTEMS: Notable for those difficulties described in his History of Present Illness and Past Medical History, but otherwise unremarkable.  PHYSICAL EXAMINATION:  GENERAL: He appears to be a well-developed, well-nourished white male in no acute distress.  VITAL SIGNS: Temperature 96.8 degrees, pulse 90, blood pressure 152/96, respiratory rate 18.  Height 5 feet 7 inches.  Weight 162 pounds.  LUNGS: Clear to auscultation.  Symmetric respiratory excursion.  HEART: Regular rate and rhythm, normal S1 and S2; no murmur.  ABDOMEN: Soft, nontender.  Bowel sounds present.  EXTREMITIES: No clubbing, cyanosis, or edema.  MUSCULOSKELETAL: No tenderness to palpation over the lumbar spinous process or paralumbar musculature.  Straight leg raising negative bilaterally.  He is able to flex 90 degrees and able to extend well.  NEUROLOGIC: Strength 5/5 in lower extremities including dorsiflexion, plantar flexion, and extensor hallucis longus.  Sensation is intact to pinprick throughout the lower extremities.  Reflexes are 1-2 at the quadriceps and gastrocnemius and symmetric bilaterally.  Toes are downgoing bilaterally.  IMPRESSION: Right lumbar radiculopathy secondary to right  L5 nerve root compression.  PLAN: The patient is admitted for right L4-5 lumbar laminotomy and foraminotomy.  We doubt necessity for diskectomy.  We discussed the nature of the procedure, typical length of surgery and hospital stay and overall recuperation, his limitations during the postoperative period including risks of  infection, bleeding, possible need for transfusion, risk of nerve dysfunction with pain, weakness, numbness, or paresthesias, the risk of recurrent spondylitic degeneration and neural compression with possible need for further surgery, and anesthetic risks of myocardial infarction, stroke, pneumonia, and death, all of which are increased due to his significant smoking history.  He also understands the alternative to the surgery is living with the symptoms as they are and treating them with NSAIDs as necessary. Understanding all this he does wish to proceed with surgery and is admitted for such. DD:  04/07/01 TD:  04/07/01 Job: [id number]

## 2011-09-05 LAB — CULTURE, ROUTINE-ABSCESS

## 2011-09-24 LAB — DIFFERENTIAL
Basophils Absolute: 0
Eosinophils Absolute: 0
Eosinophils Relative: 1
Lymphocytes Relative: 22
Lymphocytes Relative: 39
Lymphocytes Relative: 6 — ABNORMAL LOW
Lymphs Abs: 2.7
Lymphs Abs: 3.6 — ABNORMAL HIGH
Monocytes Absolute: 0.5
Monocytes Relative: 2 — ABNORMAL LOW
Monocytes Relative: 6
Neutro Abs: 25.2 — ABNORMAL HIGH
Neutro Abs: 9.3 — ABNORMAL HIGH
Neutrophils Relative %: 92 — ABNORMAL HIGH
Smear Review: ADEQUATE

## 2011-09-24 LAB — CULTURE, BLOOD (ROUTINE X 2)
Culture: NO GROWTH
Report Status: 10272008
Report Status: 10272008

## 2011-09-24 LAB — ETHANOL: Alcohol, Ethyl (B): <5

## 2011-09-24 LAB — CBC
HCT: 35.1 — ABNORMAL LOW
Hemoglobin: 12.1 — ABNORMAL LOW
Hemoglobin: 12.6 — ABNORMAL LOW
Hemoglobin: 14.8
MCHC: 33.8
MCV: 92.8
Platelets: 243
Platelets: 250
RBC: 4.71
RDW: 13.1
WBC: 12.7 — ABNORMAL HIGH
WBC: 27.3 — ABNORMAL HIGH
WBC: 9.2

## 2011-09-24 LAB — URINALYSIS, ROUTINE W REFLEX MICROSCOPIC
Ketones, ur: NEGATIVE
Nitrite: NEGATIVE
Protein, ur: NEGATIVE
Specific Gravity, Urine: 1.025
Urobilinogen, UA: 0.2
pH: 5.5

## 2011-09-24 LAB — COMPREHENSIVE METABOLIC PANEL
ALT: 25
Albumin: 3.8
Alkaline Phosphatase: 82
Calcium: 9.7
GFR calc non Af Amer: 59 — ABNORMAL LOW
Glucose, Bld: 134 — ABNORMAL HIGH
Total Protein: 6.6

## 2011-09-24 LAB — URINE MICROSCOPIC-ADD ON

## 2011-09-24 LAB — SALICYLATE LEVEL: Salicylate Lvl: <4

## 2011-09-24 LAB — RAPID URINE DRUG SCREEN, HOSP PERFORMED: Amphetamines: NOT DETECTED

## 2012-12-29 ENCOUNTER — Other Ambulatory Visit: Payer: Self-pay | Admitting: Neurology

## 2012-12-29 DIAGNOSIS — F29 Unspecified psychosis not due to a substance or known physiological condition: Secondary | ICD-10-CM

## 2013-01-03 ENCOUNTER — Ambulatory Visit
Admission: RE | Admit: 2013-01-03 | Discharge: 2013-01-03 | Disposition: A | Discharge status: Home / Self Care | Payer: Medicare HMO | Source: Ambulatory Visit | Attending: Neurology | Admitting: Neurology

## 2013-01-03 DIAGNOSIS — F29 Unspecified psychosis not due to a substance or known physiological condition: Secondary | ICD-10-CM

## 2013-01-03 MED ORDER — IOHEXOL 350 MG/ML SOLN
100.0000 mL | Freq: Once | INTRAVENOUS | Status: AC | PRN
  Administered 2013-01-03: 100 mL via INTRAVENOUS

## 2013-04-19 ENCOUNTER — Ambulatory Visit: Payer: Self-pay | Admitting: Neurology

## 2013-05-02 ENCOUNTER — Other Ambulatory Visit: Payer: Self-pay | Admitting: Internal Medicine

## 2013-05-02 DIAGNOSIS — E041 Nontoxic single thyroid nodule: Secondary | ICD-10-CM

## 2013-05-06 ENCOUNTER — Ambulatory Visit
Admission: RE | Admit: 2013-05-06 | Discharge: 2013-05-06 | Disposition: A | Discharge status: Home / Self Care | Payer: Medicare HMO | Source: Ambulatory Visit | Attending: Internal Medicine | Admitting: Internal Medicine

## 2013-05-06 DIAGNOSIS — E041 Nontoxic single thyroid nodule: Secondary | ICD-10-CM

## 2013-09-14 ENCOUNTER — Encounter: Payer: Self-pay | Admitting: Neurology

## 2013-09-15 ENCOUNTER — Other Ambulatory Visit: Payer: Self-pay | Admitting: Internal Medicine

## 2013-09-15 DIAGNOSIS — E041 Nontoxic single thyroid nodule: Secondary | ICD-10-CM

## 2013-10-05 ENCOUNTER — Ambulatory Visit (INDEPENDENT_AMBULATORY_CARE_PROVIDER_SITE_OTHER): Payer: Medicare HMO | Admitting: Neurology

## 2013-10-05 ENCOUNTER — Encounter: Payer: Self-pay | Admitting: Neurology

## 2013-10-05 ENCOUNTER — Encounter (INDEPENDENT_AMBULATORY_CARE_PROVIDER_SITE_OTHER): Payer: Self-pay

## 2013-10-05 VITALS — BP 110/66 | HR 68 | Wt 185.0 lb

## 2013-10-05 DIAGNOSIS — G43109 Migraine with aura, not intractable, without status migrainosus: Secondary | ICD-10-CM

## 2013-10-05 HISTORY — DX: Migraine with aura, not intractable, without status migrainosus: G43.109

## 2013-10-05 MED ORDER — TOPIRAMATE 25 MG PO TABS: ORAL_TABLET | ORAL | Status: DC

## 2013-10-05 NOTE — Progress Notes (Signed)
Reason for visit: Confusional episodes  DHILLON COMUNALE is an 57 y.o. male  History of present illness:  Mr. Pickrell is a 57 year old left-handed white male with a history of episodes dating back 10 years or more with discomfort that begins in the neck, going into the back of the head. The episodes may last 15 minutes or may last one half of a day. The patient may have confusion, garbled speech, and gait ataxia during the events. Once the event is over, the patient is at baseline. The headache severity is not extremely severe. The patient indicates that he did have migraine headaches when he was growing up. Prior workup has shown evidence of an unremarkable CT angiogram of the head and neck. The patient has undergone an EEG study that showed left sided slowing that was mild. No epileptiform discharges were seen. The patient returns for an evaluation. The patient may have several of these events a month.  Past Medical History  Diagnosis Date  . 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 Anxiety   . Hypertension   . Chronic low back pain     Past Surgical History  Procedure Laterality Date  . Lumbosacral spine surgery    . Spinal cord stimulator implant      Family History  Problem Relation Age of Onset  . Cancer Mother   . Heart attack Father     Social history:  reports that he has been smoking.  He has never used smokeless tobacco. He reports that he does not drink alcohol or use illicit drugs.   No Known Allergies  Medications:  Current Outpatient Prescriptions on File Prior to Visit  Medication Sig Dispense Refill  . ALPRAZolam (XANAX) 0.5 MG tablet Take 0.5 mg by mouth at bedtime as needed for sleep.      Marland Kitchen aspirin EC 81 MG tablet Take 81 mg by mouth daily.      Marland Kitchen gabapentin (NEURONTIN) 300 MG capsule Take 300 mg by mouth 3 (three) times daily.      Marland Kitchen imipramine (TOFRANIL) 25 MG tablet Take 25 mg by mouth at bedtime.      . methocarbamol (ROBAXIN) 750 MG tablet Take 750 mg by mouth 3  (three) times daily.      Marland Kitchen morphine (MS CONTIN) 30 MG 12 hr tablet Take 30 mg by mouth 2 (two) times daily.      . nabumetone (RELAFEN) 750 MG tablet Take 750 mg by mouth 2 (two) times daily.      Marland Kitchen olmesartan (BENICAR) 20 MG tablet Take 20 mg by mouth daily.      Marland Kitchen oxycodone-acetaminophen (LYNOX) 10-300 MG per tablet Take 1 tablet by mouth every 4 (four) hours as needed for pain.      . tizanidine (ZANAFLEX) 2 MG capsule Take 2 mg by mouth 3 (three) times daily. 1 capsule am, 2 capsule pm      . zolpidem (AMBIEN) 10 MG tablet Take 10 mg by mouth at bedtime as needed for sleep.       No current facility-administered medications on file prior to visit.    ROS:  Out of a complete 14 system review of symptoms, the patient complains only of the following symptoms, and all other reviewed systems are negative.  Blurred vision Numbness, weakness, slurred speech Anxiety, decreased energy Restless legs  Blood pressure 110/66, pulse 68, weight 185 lb (83.915 kg).  Physical Exam  General: The patient is alert and cooperative at the time of the examination.  Skin: No significant peripheral edema is noted.   Neurologic Exam  Mental status: The patient is oriented x 3. The Mini-Mental status examination done today shows a total score 28/30.  Cranial nerves: Facial symmetry is present. Speech is normal, no aphasia or dysarthria is noted. Extraocular movements are full. Visual fields are full.  Motor: The patient has good strength in all 4 extremities.  Sensory examination: Soft touch sensation is symmetric on all 4 extremities.  Coordination: The patient has good finger-nose-finger and heel-to-shin bilaterally.  Gait and station: The patient has a normal gait. Tandem gait is slightly unsteady. Romberg is negative. No drift is seen.  Reflexes: Deep tendon reflexes are symmetric.   Assessment/Plan:  1. Basilar migraine  The patient has a history that is fully consistent with basilar  migraine. The patient will be placed on low-dose Topamax, and he will followup in 6 months. The patient will contact me if he has tolerance issues with this medication.  Marlan Palau MD 10/05/2013 7:53 PM  [facility] [address] [place], [place] [address]  Phone [phone] Fax [phone]

## 2013-10-05 NOTE — Patient Instructions (Signed)
   Topamax (topiramate) is a seizure medication that has an FDA approval for seizures and for migraine headache. Potential side effects of this medication include weight loss, cognitive slowing, tingling in the fingers and toes, and carbonated drinks will taste bad. If any significant side effects are noted on this drug, please contact our office.  

## 2013-10-07 ENCOUNTER — Ambulatory Visit
Admission: RE | Admit: 2013-10-07 | Discharge: 2013-10-07 | Disposition: A | Discharge status: Home / Self Care | Payer: Medicare HMO | Source: Ambulatory Visit | Attending: Internal Medicine | Admitting: Internal Medicine

## 2013-10-07 DIAGNOSIS — E041 Nontoxic single thyroid nodule: Secondary | ICD-10-CM

## 2014-02-14 ENCOUNTER — Other Ambulatory Visit: Payer: Self-pay

## 2014-02-14 MED ORDER — TOPIRAMATE 25 MG PO TABS: ORAL_TABLET | ORAL | Status: DC

## 2014-04-05 ENCOUNTER — Ambulatory Visit: Payer: Medicare HMO | Admitting: Nurse Practitioner

## 2014-05-16 ENCOUNTER — Ambulatory Visit: Payer: Self-pay | Admitting: Adult Health

## 2014-05-17 ENCOUNTER — Ambulatory Visit (INDEPENDENT_AMBULATORY_CARE_PROVIDER_SITE_OTHER): Payer: Medicare Other | Admitting: Adult Health

## 2014-05-17 ENCOUNTER — Encounter (INDEPENDENT_AMBULATORY_CARE_PROVIDER_SITE_OTHER): Payer: Self-pay

## 2014-05-17 ENCOUNTER — Encounter: Payer: Self-pay | Admitting: Adult Health

## 2014-05-17 VITALS — BP 148/88 | HR 76 | Temp 97.3°F | Ht 67.0 in | Wt 182.0 lb

## 2014-05-17 DIAGNOSIS — M542 Cervicalgia: Secondary | ICD-10-CM

## 2014-05-17 DIAGNOSIS — H5509 Other forms of nystagmus: Secondary | ICD-10-CM

## 2014-05-17 DIAGNOSIS — G43109 Migraine with aura, not intractable, without status migrainosus: Secondary | ICD-10-CM

## 2014-05-17 MED ORDER — TOPIRAMATE 100 MG PO TABS: 100.0000 mg | ORAL_TABLET | Freq: Every day | ORAL | Status: DC

## 2014-05-17 NOTE — Progress Notes (Signed)
PATIENT: Jim Martin DOB: 07-Sep-1956  REASON FOR VISIT: follow up HISTORY FROM: patient  HISTORY OF PRESENT ILLNESS: Jim Martin is a 58 year old male with a history of basilar migraines. He returns today for follow-up. The patient is currently on Topamax and is tolerating it well. Patient states that he has had about 4 episodes in the past month. This episodes start with discomfort in the neck that travels to the back of the head. During these episodes he can have garbled speech, confusion and some walking difficulty. Once the episode resolve he is back to baseline.  Theses episodes usually occur when he is under a lot of stress. Patient feels that the Topamax has helped and decreased the frequency. Patient states that these headaches started when he had back surgery. States that when he tilts his head back to look up that will sometimes trigger a headache to start. Patient confirms that he does have numbness and tingling down the arms. Wife state that he tend to drop things. Patient states that if he moves the neck a certain way he will get pain down his arms. Patient also complains of neck discomfort.  No new medical issues since the last visit.   REVIEW OF SYSTEMS: Full 14 system review of systems performed and notable only for:  Constitutional: fatigue, excessive sweating Eyes: blurred vision Ear/Nose/Throat: N/A  Skin: N/A  Cardiovascular: N/A  Respiratory: N/A  Gastrointestinal: N/A  Genitourinary: N/A Hematology/Lymphatic: N/A  Endocrine: N/A Musculoskeletal: joint swelling, back pain, aching muscles, walking difficulty, neck pain and stiffness Allergy/Immunology: N/A  Neurological: numbness Psychiatric: N/A Sleep: restless leg  ALLERGIES: No Known Allergies  HOME MEDICATIONS: Outpatient Prescriptions Prior to Visit  Medication Sig Dispense Refill  . ALPRAZolam (XANAX) 0.5 MG tablet Take 0.5 mg by mouth at bedtime as needed for sleep.      Marland Kitchen amLODipine (NORVASC) 10 MG  tablet Take 10 mg by mouth daily.      Marland Kitchen aspirin EC 81 MG tablet Take 81 mg by mouth daily.      Marland Kitchen gabapentin (NEURONTIN) 300 MG capsule Take 300 mg by mouth 3 (three) times daily.      Marland Kitchen imipramine (TOFRANIL) 25 MG tablet Take 25 mg by mouth at bedtime.      . methocarbamol (ROBAXIN) 750 MG tablet Take 750 mg by mouth 3 (three) times daily.      Marland Kitchen morphine (MS CONTIN) 30 MG 12 hr tablet Take 30 mg by mouth 2 (two) times daily.      . nabumetone (RELAFEN) 750 MG tablet Take 750 mg by mouth 2 (two) times daily.      Marland Kitchen olmesartan (BENICAR) 20 MG tablet Take 20 mg by mouth daily.      . Oxycodone HCl 10 MG TABS Take 10 mg by mouth daily.      Marland Kitchen oxycodone-acetaminophen (LYNOX) 10-300 MG per tablet Take 1 tablet by mouth every 4 (four) hours as needed for pain.      . tizanidine (ZANAFLEX) 2 MG capsule Take 2 mg by mouth 3 (three) times daily. 1 capsule am, 2 capsule pm      . topiramate (TOPAMAX) 25 MG tablet One tablet at night for one week, then take 2 tablets at night  60 tablet  3  . zolpidem (AMBIEN) 10 MG tablet Take 10 mg by mouth at bedtime as needed for sleep.       No facility-administered medications prior to visit.    PAST MEDICAL  HISTORY: Past Medical History  Diagnosis Date  . 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 Anxiety   . Hypertension   . Chronic low back pain     PAST SURGICAL HISTORY: Past Surgical History  Procedure Laterality Date  . Lumbosacral spine surgery    . Spinal cord stimulator implant      FAMILY HISTORY: Family History  Problem Relation Age of Onset  . Cancer Mother   . Heart attack Father     SOCIAL HISTORY: History   Social History  . Marital Status: Single    Spouse Name: N/A    Number of Children: 1  . Years of Education: 8th   Occupational History  . disabilty    Social History Main Topics  . Smoking status: Current Every Day Smoker -- 1.00 packs/day  . Smokeless tobacco: Never Used  . Alcohol Use: No  . Drug Use: No  . Sexual  Activity: Not on file   Other Topics Concern  . Not on file   Social History Narrative  . No narrative on file      PHYSICAL EXAM  Filed Vitals:   05/17/14 1025  BP: 148/88  Pulse: 76  Temp: 97.3 F (36.3 C)  TempSrc: Oral  Height: 5' 7"  (1.702 m)  Weight: 182 lb (82.555 kg)   Body mass index is 28.5 kg/(m^2).  Generalized: Well developed, in no acute distress   Neurological examination  Mentation: Alert oriented to time, place, history taking. Follows all commands speech and language fluent Cranial nerve II-XII: . Extraocular movements were full, visual field were full on confrontational test. Horizontal nystagmus- to the right and left, that does not resolve after several beats. Motor: The motor testing reveals 5 over 5 strength of all 4 extremities. Good symmetric motor tone is noted throughout. No pain elicited with extension and flexion of neck. Patient did complain that it felt "tight." Sensory: Sensory testing is intact to soft touch on all 4 extremities. No evidence of extinction is noted.  Coordination: Cerebellar testing reveals good finger-nose-finger and heel-to-shin bilaterally.  Gait and station: Gait is normal. Tandem gait is normal. Romberg is negative. No drift is seen.  Reflexes: Deep tendon reflexes are symmetric and normal bilaterally.    DIAGNOSTIC DATA (LABS, IMAGING, TESTING) - I reviewed patient records, labs, notes, testing and imaging myself where available.  Lab Results  Component Value Date   WBC 9.2 10/08/2007   HGB 12.1* 10/08/2007   HCT 35.1* 10/08/2007   MCV 92.1 10/08/2007   PLT 195 10/08/2007      Component Value Date/Time   NA 140 10/06/2007 2020   K 4.3 10/06/2007 2020   CL 105 10/06/2007 2020   CO2 30 10/06/2007 2020   GLUCOSE 134* 10/06/2007 2020   BUN 12 11/04/2010 1420   CREATININE 0.94 11/04/2010 1420   CALCIUM 9.7 10/06/2007 2020   PROT 6.6 10/06/2007 2020   ALBUMIN 3.8 10/06/2007 2020   AST 35 10/06/2007 2020    ALT 25 10/06/2007 2020   ALKPHOS 82 10/06/2007 2020   BILITOT 0.5 10/06/2007 2020   GFRNONAA >60 11/04/2010 1420   GFRAA  Value: >60        The eGFR has been calculated using the MDRD equation. This calculation has not been validated in all clinical situations. eGFR's persistently <60 mL/min signify possible Chronic Kidney Disease. 11/04/2010 1420    ASSESSMENT AND PLAN 58 y.o. year old male  has a past medical history of Basilar migraine (10/05/2013); Anxiety; Hypertension; and  Chronic low back pain. here with:  1. Basilar migraine 2. Neck pain 3. Horizontal nystagmus  Patient continues to have 4-5 episodes a month. He feels that the Topamax has helped. Patient is having neck discomfort that he associates with causing the headaches. States that when he tilts his head back to look up it will sometimes trigger a headache. On exam he had multiple beats of horizontal nystagmus that was not there on his prior visit. Patient is unable to have MRI due to spinal cord stimulator. We will do a CT of the brain and neck to look for any acute changes that could be the cause of his headaches and neck discomfort. We will increase his Topamax to 75 mg for 1 week then 100 mg at night. Patient can use up his 25 mg tablets, taking 3 tablets for one week then 4 tablets. I will order the 100 mg tablets today. Patient should follow-up in 3-4 months or sooner if needed.    Ward Givens, MSN, NP-C 05/17/2014, 10:33 AM [facility] [address], [place], [place] [address] [phone]  Note: This document was prepared with digital dictation and possible smart phrase technology. Any transcriptional errors that result from this process are unintentional.

## 2014-05-17 NOTE — Patient Instructions (Addendum)
Take Topamax 75 mg (3 tablets) for 1 week then increase to 100 mg tablet at night.

## 2014-05-17 NOTE — Progress Notes (Signed)
I have read the note, and I agree with the clinical assessment and plan.  Jonna Dittrich K Shakya Sebring   

## 2014-05-22 ENCOUNTER — Telehealth: Payer: Self-pay | Admitting: Adult Health

## 2014-05-22 NOTE — Telephone Encounter (Signed)
Dorian Pod at [facility] has questions regarding CT referall received today. [phone]

## 2014-05-24 NOTE — Telephone Encounter (Signed)
Spoke to [place] at [facility] and the authorization code for both CT's is [id number], good until 06/22/2014, left a detailed message on [place] at [facility], and the referral was updated with the above information.

## 2014-05-25 ENCOUNTER — Ambulatory Visit
Admission: RE | Admit: 2014-05-25 | Discharge: 2014-05-25 | Disposition: A | Discharge status: Home / Self Care | Payer: Medicare Other | Source: Ambulatory Visit | Attending: Adult Health | Admitting: Adult Health

## 2014-05-25 DIAGNOSIS — M542 Cervicalgia: Secondary | ICD-10-CM

## 2014-05-25 DIAGNOSIS — G43109 Migraine with aura, not intractable, without status migrainosus: Secondary | ICD-10-CM

## 2014-05-25 MED ORDER — IOHEXOL 350 MG/ML SOLN
60.0000 mL | Freq: Once | INTRAVENOUS | Status: AC | PRN
  Administered 2014-05-25: 60 mL via INTRAVENOUS

## 2014-05-26 ENCOUNTER — Telehealth: Payer: Self-pay | Admitting: *Deleted

## 2014-05-26 NOTE — Telephone Encounter (Signed)
Called patient no answer. Left a message that the CTA was normal and no acute changes. Any questions call the office at [phone].

## 2014-05-26 NOTE — Telephone Encounter (Signed)
Message copied by Vivi Barrack on Fri May 26, 2014 10:14 AM ------      Message from: Trudie Buckler      Created: Fri May 26, 2014  8:37 AM       Normal CTA of head and neck. No acute changes. Spondylosis of cervical spine stable. Please call patient with results. ------

## 2014-05-29 NOTE — Progress Notes (Signed)
Quick Note:  Shared normal CT Head results with patient, verbalized understanding ______

## 2014-07-20 NOTE — Telephone Encounter (Signed)
Noted  

## 2014-08-17 ENCOUNTER — Ambulatory Visit (INDEPENDENT_AMBULATORY_CARE_PROVIDER_SITE_OTHER): Payer: Medicare Other | Admitting: Adult Health

## 2014-08-17 ENCOUNTER — Encounter: Payer: Self-pay | Admitting: Adult Health

## 2014-08-17 VITALS — BP 148/86 | HR 80 | Ht 67.0 in | Wt 179.0 lb

## 2014-08-17 DIAGNOSIS — G43109 Migraine with aura, not intractable, without status migrainosus: Secondary | ICD-10-CM

## 2014-08-17 NOTE — Progress Notes (Signed)
PATIENT: Jim Martin DOB: 1956-10-03  REASON FOR VISIT: follow up HISTORY FROM: patient  HISTORY OF PRESENT ILLNESS: Jim Martin is a 58 year old male with a history of basilar migraines. He returns today for follow-up. He is currently taking Topamax 100 mg at night and tolerating it well. He states that his headaches have improved. He states that he has gone almost a month without having a basilar migraine. He does state that he might get a dull headache every now and then. He states it usually will start in his neck and if he can take a xanax, it helps relieve the tension and it never develops into a headache. Patient reports that he has to wear glasses to read but lately his vision has been more blurry. He states looking at the TV can also irritate his eyes. He states that he started having trouble with his vision after his back surgery. He states that [name] hit a nerve while operating and that has caused him a lot problems. He no longer follows-up with [name]. He states he got very angry in his office and is no longer welcomed there. He plans to make an appointment with an eye appointment soon.   REVIEW OF SYSTEMS: Full 14 system review of systems performed and notable only for:  Constitutional: N/A  Eyes: blurred vision Ear/Nose/Throat: N/A  Skin: N/A  Cardiovascular: N/A  Respiratory: N/A  Gastrointestinal: constipation Genitourinary: N/A Hematology/Lymphatic: N/A  Endocrine: N/A Musculoskeletal:joint pain, back pain, aching muscles, muscle cramps, walking difficulty, neck pain, neck stiffness Allergy/Immunology: N/A  Neurological: numbness, weakness Psychiatric: N/A Sleep: restless leg   ALLERGIES: No Known Allergies  HOME MEDICATIONS: Outpatient Prescriptions Prior to Visit  Medication Sig Dispense Refill  . ALPRAZolam (XANAX) 0.5 MG tablet Take 0.5 mg by mouth at bedtime as needed for sleep.      Marland Kitchen amLODipine (NORVASC) 10 MG tablet Take 10 mg by mouth  daily.      Marland Kitchen aspirin EC 81 MG tablet Take 81 mg by mouth daily.      Marland Kitchen gabapentin (NEURONTIN) 300 MG capsule Take 300 mg by mouth 3 (three) times daily.      Marland Kitchen imipramine (TOFRANIL) 25 MG tablet Take 25 mg by mouth at bedtime.      . methocarbamol (ROBAXIN) 750 MG tablet Take 750 mg by mouth 3 (three) times daily.      Marland Kitchen morphine (MS CONTIN) 30 MG 12 hr tablet Take 30 mg by mouth 2 (two) times daily.      . nabumetone (RELAFEN) 750 MG tablet Take 750 mg by mouth 2 (two) times daily.      Marland Kitchen olmesartan (BENICAR) 20 MG tablet Take 20 mg by mouth daily.      . Oxycodone HCl 10 MG TABS Take 10 mg by mouth daily.      Marland Kitchen oxycodone-acetaminophen (LYNOX) 10-300 MG per tablet Take 1 tablet by mouth every 4 (four) hours as needed for pain.      . tizanidine (ZANAFLEX) 2 MG capsule Take 2 mg by mouth 3 (three) times daily. 1 capsule am, 2 capsule pm      . topiramate (TOPAMAX) 100 MG tablet Take 1 tablet (100 mg total) by mouth at bedtime.  30 tablet  3  . zolpidem (AMBIEN) 10 MG tablet Take 10 mg by mouth at bedtime as needed for sleep.       No facility-administered medications prior to visit.    PAST MEDICAL HISTORY:  Past Medical History  Diagnosis Date  . 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 Anxiety   . Hypertension   . Chronic low back pain     PAST SURGICAL HISTORY: Past Surgical History  Procedure Laterality Date  . Lumbosacral spine surgery    . Spinal cord stimulator implant      FAMILY HISTORY: Family History  Problem Relation Age of Onset  . Cancer Mother   . Heart attack Father     SOCIAL HISTORY: History   Social History  . Marital Status: Single    Spouse Name: N/A    Number of Children: 1  . Years of Education: 8th   Occupational History  . disabilty    Social History Main Topics  . Smoking status: Current Every Day Smoker -- 1.00 packs/day  . Smokeless tobacco: Never Used  . Alcohol Use: No  . Drug Use: No  . Sexual Activity: Not on file   Other Topics  Concern  . Not on file   Social History Narrative  . No narrative on file      PHYSICAL EXAM  Filed Vitals:   08/17/14 1022  BP: 148/86  Pulse: 80  Height: 5' 7"  (1.702 m)  Weight: 179 lb (81.194 kg)   Body mass index is 28.03 kg/(m^2).  Generalized: Well developed, in no acute distress   Neurological examination  Mentation: Alert oriented to time, place, history taking. Follows all commands speech and language fluent Cranial nerve II-XII: Pupils were equal round reactive to light. Extraocular movements were full, visual field were full on confrontational test. Multiple beats of horizontal nystagmus. Facial sensation and strength were normal.  Uvula tongue midline. Head turning and shoulder shrug  were normal and symmetric. Motor: The motor testing reveals 5 over 5 strength of all 4 extremities. Good symmetric motor tone is noted throughout.  Sensory: Sensory testing is intact to soft touch on all 4 extremities. No evidence of extinction is noted.  Coordination: Cerebellar testing reveals good finger-nose-finger and heel-to-shin bilaterally.  Gait and station: Gait is normal. Tandem gait is unsteady. Romberg is negative. No drift is seen.  Reflexes: Deep tendon reflexes are symmetric and normal bilaterally.    DIAGNOSTIC DATA (LABS, IMAGING, TESTING) - I reviewed patient records, labs, notes, testing and imaging myself where available.  Lab Results  Component Value Date   WBC 9.2 10/08/2007   HGB 12.1* 10/08/2007   HCT 35.1* 10/08/2007   MCV 92.1 10/08/2007   PLT 195 10/08/2007      Component Value Date/Time   NA 140 10/06/2007 2020   K 4.3 10/06/2007 2020   CL 105 10/06/2007 2020   CO2 30 10/06/2007 2020   GLUCOSE 134* 10/06/2007 2020   BUN 12 11/04/2010 1420   CREATININE 0.94 11/04/2010 1420   CALCIUM 9.7 10/06/2007 2020   PROT 6.6 10/06/2007 2020   ALBUMIN 3.8 10/06/2007 2020   AST 35 10/06/2007 2020   ALT 25 10/06/2007 2020   ALKPHOS 82 10/06/2007 2020    BILITOT 0.5 10/06/2007 2020   GFRNONAA >60 11/04/2010 1420   GFRAA  Value: >60        The eGFR has been calculated using the MDRD equation. This calculation has not been validated in all clinical situations. eGFR's persistently <60 mL/min signify possible Chronic Kidney Disease. 11/04/2010 1420     ASSESSMENT AND PLAN 58 y.o. year old male  has a past medical history of Basilar migraine (10/05/2013); Anxiety; Hypertension; and Chronic low back pain. here with:  1. Basilar migraines  Continue Topamax 100 mg at night. He states he may need a referral to see an eye MD in order for his insurance to cover it. I asked him to check with his insurance first and if he needs a referral I will put it in then.  He should follow-up in 6 months or sooner if needed.   Ward Givens, MSN, NP-C 08/17/2014, 10:12 AM [facility] [address], [place], [place] [address] [phone]  Note: This document was prepared with digital dictation and possible smart phrase technology. Any transcriptional errors that result from this process are unintentional.

## 2014-08-17 NOTE — Patient Instructions (Signed)
Bickerstaff's Syndrome (Basilar Migraine) CAUSES  When migraine affects the circulation in back of the brain or neck, it can cause basilar migraine or Bickerstaff's syndrome.  SYMPTOMS  It occurs most frequently in young women. Symptoms include:  Dizziness.  Double vision.  Loss of balance.  Confusion.  Slurred speech.  Fainting.  Disorientation.  During the severe (acute) headache, some people lose consciousness. Often these patients are mistakenly thought to be intoxicated, under the influence of drugs, or suffering from other conditions. A previous history of migraine is helpful in making the diagnosis.  TREATMENT  Basilar migraines are treated medically the same as all other migraines. HOME CARE INSTRUCTIONS   If this is your first diagnosed migraine headache, you may simply choose to wait and watch. You can wait to see if you have another headache before deciding on a further treatment.  You may consult your caregiver or do as suggested by the current treating caregiver.  Numerous medications can prevent these headaches if they are recurrent or should they become recurrent. Your caregiver can help you with a medication or treatment program that will be helpful to you.  If this has been a chronic (long-standing) condition, using continuous narcotics is not recommended. Using long-term narcotics can cause recurrent migraines. Narcotics are a temporary measure only. They are used for the infrequent migraine that fails to respond to all other measures. SEEK IMMEDIATE MEDICAL CARE IF:   You do not get relief from the medications given to you or you have a recurrence of pain.  You have an unexplained oral temperature above 102 F (38.9 C), or as your caregiver suggests.  You have a stiff neck.  You have loss of vision.  You have muscular weakness.  You have loss of muscular control.  You develop severe symptoms different from your first symptoms.  You start losing  your balance or have trouble walking.  You feel faint or pass out. MAKE SURE YOU:   Understand these instructions.  Will watch your condition.  Will get help right away if you are not doing well or get worse. Document Released: 12/01/2005 Document Revised: 02/23/2012 Document Reviewed: 07/19/2008 Exit[employer] Patient Information 2015 [employer]. This information is not intended to replace advice given to you by your health care provider. Make sure you discuss any questions you have with your health care provider.  

## 2014-08-17 NOTE — Progress Notes (Signed)
I have read the note, and I agree with the clinical assessment and plan.  Amber Guthridge KEITH   

## 2014-10-27 ENCOUNTER — Telehealth: Payer: Self-pay | Admitting: Neurology

## 2014-10-27 DIAGNOSIS — G43109 Migraine with aura, not intractable, without status migrainosus: Secondary | ICD-10-CM

## 2014-10-27 MED ORDER — TOPIRAMATE 100 MG PO TABS: 100.0000 mg | ORAL_TABLET | Freq: Every day | ORAL | Status: DC

## 2014-10-27 NOTE — Telephone Encounter (Signed)
The patient called. He is out of the topamax. I called in a Rx for him.

## 2015-02-15 ENCOUNTER — Encounter: Payer: Self-pay | Admitting: Adult Health

## 2015-02-15 ENCOUNTER — Ambulatory Visit (INDEPENDENT_AMBULATORY_CARE_PROVIDER_SITE_OTHER): Payer: Medicare Other | Admitting: Adult Health

## 2015-02-15 VITALS — BP 139/84 | HR 74 | Ht 67.0 in | Wt 181.0 lb

## 2015-02-15 DIAGNOSIS — G43109 Migraine with aura, not intractable, without status migrainosus: Secondary | ICD-10-CM

## 2015-02-15 NOTE — Patient Instructions (Signed)
Continue Topamax 100 mg daily If your migraines increase please let us know.  If you have any new symptoms please let us know.

## 2015-02-15 NOTE — Progress Notes (Signed)
PATIENT: Jim Martin DOB: 12-Feb-1956  REASON FOR VISIT: follow up- basilar migraines HISTORY FROM: patient  HISTORY OF PRESENT ILLNESS: Jim Martin is a 59 year old male with a history of basilar migraines. He returns today for follow-up. He is currently taking Topamax 100 mg at night and tolerating it well. He states that his headaches have been under good control. He states that this past Saturday he did have an event. He states that his balance was affected and he had some trouble with his speech. He was able to sit down and rest and by the next day the symptoms had resolved. He states that this is the first event that he's had in 2 months. Although he states that this is not one of the more severe episodes he's had in the past. He states that after his back surgery with [name] he has certain movements that can aggravate the nerves and will cause the headache to flare. He continues to use Xanax for neck pain. He states that he has an appointment with his primary care provider next week. Overall the patient has remained the same. No new medical issues since last seen.  HISTORY:Jim Martin is a 59 year old male with a history of basilar migraines. He returns today for follow-up. He is currently taking Topamax 100 mg at night and tolerating it well. He states that his headaches have improved. He states that he has gone almost a month without having a basilar migraine. He does state that he might get a dull headache every now and then. He states it usually will start in his neck and if he can take a xanax, it helps relieve the tension and it never develops into a headache. Patient reports that he has to wear glasses to read but lately his vision has been more blurry. He states looking at the TV can also irritate his eyes. He states that he started having trouble with his vision after his back surgery. He states that [name] hit a nerve while operating and that has caused him a lot problems. He  no longer follows-up with [name]. He states he got very angry in his office and is no longer welcomed there. He plans to make an appointment with an eye appointment soon.  REVIEW OF SYSTEMS: Out of a complete 14 system review of symptoms, the patient complains only of the following symptoms, and all other reviewed systems are negative.  Joint pain, joint swelling, back pain, aching muscles, muscle cramps, walking difficulty, neck pain, neck stiffness, restless leg, leg swelling, hearing loss, blurred vision, headache, numbness, speech difficulty  ALLERGIES: No Known Allergies  HOME MEDICATIONS: Outpatient Prescriptions Prior to Visit  Medication Sig Dispense Refill  . ALPRAZolam (XANAX) 0.5 MG tablet Take 0.5 mg by mouth at bedtime as needed for sleep.    Marland Kitchen amLODipine (NORVASC) 10 MG tablet Take 10 mg by mouth daily.    Marland Kitchen aspirin EC 81 MG tablet Take 81 mg by mouth daily.    Marland Kitchen gabapentin (NEURONTIN) 300 MG capsule Take 300 mg by mouth 3 (three) times daily.    Marland Kitchen imipramine (TOFRANIL) 25 MG tablet Take 25 mg by mouth at bedtime.    . methocarbamol (ROBAXIN) 750 MG tablet Take 750 mg by mouth 3 (three) times daily.    Marland Kitchen morphine (MS CONTIN) 30 MG 12 hr tablet Take 30 mg by mouth 2 (two) times daily.    . nabumetone (RELAFEN) 750 MG tablet Take 750 mg  by mouth 2 (two) times daily.    . Oxycodone HCl 10 MG TABS Take 10 mg by mouth daily.    Marland Kitchen topiramate (TOPAMAX) 100 MG tablet Take 1 tablet (100 mg total) by mouth at bedtime. 30 tablet 5  . zolpidem (AMBIEN) 10 MG tablet Take 10 mg by mouth at bedtime as needed for sleep.     No facility-administered medications prior to visit.    PAST MEDICAL HISTORY: Past Medical History  Diagnosis Date  . 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 Anxiety   . Hypertension   . Chronic low back pain     PAST SURGICAL HISTORY: Past Surgical History  Procedure Laterality Date  . Lumbosacral spine surgery    . Spinal cord stimulator implant      FAMILY  HISTORY: Family History  Problem Relation Age of Onset  . Cancer Mother   . Heart attack Father     SOCIAL HISTORY: History   Social History  . Marital Status: Single    Spouse Name: N/A  . Number of Children: 1  . Years of Education: 8th   Occupational History  . disabilty    Social History Main Topics  . Smoking status: Current Every Day Smoker -- 1.00 packs/day  . Smokeless tobacco: Never Used  . Alcohol Use: No  . Drug Use: No  . Sexual Activity: Not on file   Other Topics Concern  . Not on file   Social History Narrative   Patient is single with one child.   Patient is left handed.   Patient has an 8 th grade education.   Patient drinks 5 or more sodas daily.      PHYSICAL EXAM  Filed Vitals:   02/15/15 1357  BP: 139/84  Pulse: 74  Height: 5\' 7"  (1.702 m)  Weight: 181 lb (82.101 kg)   Body mass index is 28.34 kg/(m^2).  Generalized: Well developed, in no acute distress   Neurological examination  Mentation: Alert oriented to time, place, history taking. Follows all commands speech and language fluent Cranial nerve II-XII: Pupils were equal round reactive to light. Extraocular movements were full, visual field were full on confrontational test. Facial sensation and strength were normal. Uvula tongue midline. Head turning and shoulder shrug  were normal and symmetric. Motor: The motor testing reveals 5 over 5 strength of all 4 extremities. Good symmetric motor tone is noted throughout.  Sensory: Sensory testing is intact to soft touch on all 4 extremities. No evidence of extinction is noted.  Coordination: Cerebellar testing reveals good finger-nose-finger and heel-to-shin bilaterally.  Gait and station: Gait is normal. Tandem gait is normal. Romberg is negative. No drift is seen.  Reflexes: Deep tendon reflexes are symmetric and normal bilaterally.  Marland Kitchen   DIAGNOSTIC DATA (LABS, IMAGING, TESTING) - I reviewed patient records, labs, notes, testing and  imaging myself where available.     ASSESSMENT AND PLAN 59 y.o. year old male  has a past medical history of Basilar migraine (10/05/2013); Anxiety; Hypertension; and Chronic low back pain. here with:  1. Basilar migraines  Overall the patient is doing well. He will continue taking Topamax 100 mg at bedtime. If he noticed that his migraine events become more frequent or the severity increases he should let us know. If the patient develops new symptoms or his current symptoms worsen he will let us know. Otherwise he will follow-up in 6 months or sooner if needed.   Ward Givens, MSN, NP-C 02/15/2015, 2:26 PM [facility]  [place], [place], [place] [address] [phone]  Note: This document was prepared with digital dictation and possible smart phrase technology. Any transcriptional errors that result from this process are unintentional.

## 2015-02-15 NOTE — Progress Notes (Signed)
I have read the note, and I agree with the clinical assessment and plan.  Lindzie Boxx KEITH   

## 2015-03-06 ENCOUNTER — Ambulatory Visit (HOSPITAL_COMMUNITY)
Admission: RE | Admit: 2015-03-06 | Discharge: 2015-03-06 | Disposition: A | Discharge status: Home / Self Care | Payer: Medicare Other | Source: Ambulatory Visit | Attending: Internal Medicine | Admitting: Internal Medicine

## 2015-03-06 ENCOUNTER — Other Ambulatory Visit (HOSPITAL_COMMUNITY): Payer: Self-pay | Admitting: Internal Medicine

## 2015-03-06 DIAGNOSIS — M5126 Other intervertebral disc displacement, lumbar region: Secondary | ICD-10-CM

## 2015-03-06 DIAGNOSIS — M5416 Radiculopathy, lumbar region: Secondary | ICD-10-CM | POA: Insufficient documentation

## 2015-05-03 ENCOUNTER — Other Ambulatory Visit: Payer: Self-pay | Admitting: Neurology

## 2015-08-22 ENCOUNTER — Ambulatory Visit: Payer: Medicare Other | Admitting: Adult Health

## 2015-08-22 ENCOUNTER — Telehealth: Payer: Self-pay

## 2015-08-22 NOTE — Telephone Encounter (Signed)
Patient no- showed his apt today.

## 2015-08-23 ENCOUNTER — Encounter: Payer: Self-pay | Admitting: Adult Health

## 2015-11-02 ENCOUNTER — Other Ambulatory Visit: Payer: Self-pay | Admitting: Neurology

## 2015-11-02 NOTE — Telephone Encounter (Signed)
Patient has appt scheduled

## 2015-11-06 ENCOUNTER — Ambulatory Visit: Payer: Medicare Other | Admitting: Adult Health

## 2015-11-12 ENCOUNTER — Encounter: Payer: Self-pay | Admitting: Adult Health

## 2015-11-12 ENCOUNTER — Ambulatory Visit (INDEPENDENT_AMBULATORY_CARE_PROVIDER_SITE_OTHER): Payer: Medicare Other | Admitting: Adult Health

## 2015-11-12 VITALS — BP 144/84 | HR 73 | Ht 67.0 in | Wt 166.0 lb

## 2015-11-12 DIAGNOSIS — G43109 Migraine with aura, not intractable, without status migrainosus: Secondary | ICD-10-CM

## 2015-11-12 MED ORDER — TOPIRAMATE 100 MG PO TABS: 100.0000 mg | ORAL_TABLET | Freq: Every day | ORAL | Status: DC

## 2015-11-12 NOTE — Progress Notes (Signed)
I have read the note, and I agree with the clinical assessment and plan.  Abdulkareem Badolato KEITH   

## 2015-11-12 NOTE — Progress Notes (Signed)
PATIENT: Jim Martin DOB: 06/12/1956  REASON FOR VISIT: follow up-  Basilar migraines HISTORY FROM: patient  HISTORY OF PRESENT ILLNESS:  Jim Martin is a 59 year old male with a history of basilar migraines. He returns today for follow-up. He is currently taking Topamax 100 mg at bedtime. He tolerates this medication well. He states that this is controlling his headaches. He states that since the last visit he may have had two episodes but states that they were not severe. Overall he is doing very well. He denies any new neurological symptoms. He denies any new medical history. He returns today for an evaluation.  HISTORY 02/15/15:  Jim Martin is a 59 year old male with a history of basilar migraines. He returns today for follow-up. He is currently taking Topamax 100 mg at night and tolerating it well. He states that his headaches have been under good control. He states that this past Saturday he did have an event. He states that his balance was affected and he had some trouble with his speech. He was able to sit down and rest and by the next day the symptoms had resolved. He states that this is the first event that he's had in 2 months. Although he states that this is not one of the more severe episodes he's had in the past. He states that after his back surgery with [name] he has certain movements that can aggravate the nerves and will cause the headache to flare. He continues to use Xanax for neck pain. He states that he has an appointment with his primary care provider next week. Overall the patient has remained the same. No new medical issues since last seen.  REVIEW OF SYSTEMS: Out of a complete 14 system review of symptoms, the patient complains only of the following symptoms, and all other reviewed systems are negative.   activity change, appetite change, fatigue, hearing loss, ear pain, eye redness, blurred vision, leg swelling, restless leg, constipation, excessive thirst, joint  pain, joint swelling, back pain, aching muscles, muscle cramps, walking difficulty, neck pain, neck stiffness, decreased concentration , nervous/anxious, dizziness, headache, numbness, seizures, speech difficulty, weakness, bruise/bleed easily  ALLERGIES: No Known Allergies  HOME MEDICATIONS: Outpatient Prescriptions Prior to Visit  Medication Sig Dispense Refill  . amLODipine (NORVASC) 10 MG tablet Take 10 mg by mouth daily.    Marland Kitchen aspirin EC 81 MG tablet Take 81 mg by mouth daily.    Marland Kitchen gabapentin (NEURONTIN) 300 MG capsule Take 300 mg by mouth 3 (three) times daily.    Marland Kitchen imipramine (TOFRANIL) 25 MG tablet Take 25 mg by mouth at bedtime.    . methocarbamol (ROBAXIN) 750 MG tablet Take 750 mg by mouth 3 (three) times daily.    Marland Kitchen morphine (MS CONTIN) 30 MG 12 hr tablet Take 30 mg by mouth 2 (two) times daily.    . nabumetone (RELAFEN) 750 MG tablet Take 750 mg by mouth 2 (two) times daily.    Marland Kitchen oxyCODONE (ROXICODONE) 15 MG immediate release tablet 15 mg as directed.  0  . zolpidem (AMBIEN) 10 MG tablet Take 10 mg by mouth at bedtime as needed for sleep.    Marland Kitchen topiramate (TOPAMAX) 100 MG tablet TAKE ONE TABLET BY MOUTH AT BEDTIME. 30 tablet 0  . ALPRAZolam (XANAX) 0.5 MG tablet Take 0.5 mg by mouth at bedtime as needed for sleep.    Marland Kitchen Oxycodone HCl 10 MG TABS Take 10 mg by mouth daily.     No facility-administered  medications prior to visit.    PAST MEDICAL HISTORY: Past Medical History  Diagnosis Date  . 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 Anxiety   . Hypertension   . Chronic low back pain     PAST SURGICAL HISTORY: Past Surgical History  Procedure Laterality Date  . Lumbosacral spine surgery    . Spinal cord stimulator implant      FAMILY HISTORY: Family History  Problem Relation Age of Onset  . Cancer Mother   . Heart attack Father     SOCIAL HISTORY: Social History   Social History  . Marital Status: Single    Spouse Name: N/A  . Number of Children: 1  . Years of  Education: 8th   Occupational History  . disabilty    Social History Main Topics  . Smoking status: Current Every Day Smoker -- 1.00 packs/day  . Smokeless tobacco: Never Used  . Alcohol Use: No  . Drug Use: No  . Sexual Activity: Not on file   Other Topics Concern  . Not on file   Social History Narrative   Patient is single with one child.   Patient is left handed.   Patient has an 8 th grade education.   Patient drinks 5 or more sodas daily.      PHYSICAL EXAM  Filed Vitals:   11/12/15 1432  BP: 144/84  Pulse: 73  Height: 5\' 7"  (1.702 m)  Weight: 166 lb (75.297 kg)   Body mass index is 25.99 kg/(m^2).  Generalized: Well developed, in no acute distress   Neurological examination  Mentation: Alert oriented to time, place, history taking. Follows all commands speech and language fluent Cranial nerve II-XII: Pupils were equal round reactive to light. Extraocular movements were full,  In the nystagmus noted with lateral movement of the eye. visual field were full on confrontational test. Facial sensation and strength were normal. Uvula tongue midline. Head turning and shoulder shrug  were normal and symmetric. Motor: The motor testing reveals 5 over 5 strength of all 4 extremities. Good symmetric motor tone is noted throughout.  Sensory: Sensory testing is intact to soft touch on all 4 extremities. No evidence of extinction is noted.  Coordination: Cerebellar testing reveals good finger-nose-finger and heel-to-shin bilaterally.  Gait and station: Gait is normal. Tandem gait is  Unsteady.. Romberg is negative. No drift is seen.  Reflexes: Deep tendon reflexes are symmetric and normal bilaterally.   DIAGNOSTIC DATA (LABS, IMAGING, TESTING) - I reviewed patient records, labs, notes, testing and imaging myself where available.   ASSESSMENT AND PLAN 59 y.o. year old male  has a past medical history of Basilar migraine (10/05/2013); Anxiety; Hypertension; and Chronic low  back pain. here with:  1. Basilar Migraine   Overall the patient is doing well. He will remain on Topamax 100 mg at bedtime. Patient advised that if his symptoms worsen or he develops new symptoms he should let us know. He will follow-up in 6 months with Dr. Jannifer Franklin.  Ward Givens, MSN, NP-C 11/12/2015, 4:43 PM [facility] [address] [place], [place] [address] [phone]

## 2015-11-12 NOTE — Patient Instructions (Signed)
Continue Topamax If your symptoms worsen or you develop new symptoms please let us know.   

## 2015-12-10 ENCOUNTER — Observation Stay (HOSPITAL_COMMUNITY)
Admission: EM | Admit: 2015-12-10 | Discharge: 2015-12-11 | Disposition: A | Discharge status: Home / Self Care | Payer: Medicare Other | Attending: Internal Medicine | Admitting: Internal Medicine

## 2015-12-10 ENCOUNTER — Emergency Department (HOSPITAL_COMMUNITY): Payer: Medicare Other

## 2015-12-10 ENCOUNTER — Encounter (HOSPITAL_COMMUNITY): Payer: Self-pay

## 2015-12-10 DIAGNOSIS — G8929 Other chronic pain: Secondary | ICD-10-CM | POA: Insufficient documentation

## 2015-12-10 DIAGNOSIS — F419 Anxiety disorder, unspecified: Secondary | ICD-10-CM | POA: Diagnosis not present

## 2015-12-10 DIAGNOSIS — G43109 Migraine with aura, not intractable, without status migrainosus: Secondary | ICD-10-CM | POA: Insufficient documentation

## 2015-12-10 DIAGNOSIS — R1110 Vomiting, unspecified: Secondary | ICD-10-CM | POA: Insufficient documentation

## 2015-12-10 DIAGNOSIS — M545 Low back pain, unspecified: Secondary | ICD-10-CM | POA: Diagnosis present

## 2015-12-10 DIAGNOSIS — R739 Hyperglycemia, unspecified: Secondary | ICD-10-CM | POA: Diagnosis present

## 2015-12-10 DIAGNOSIS — F17200 Nicotine dependence, unspecified, uncomplicated: Secondary | ICD-10-CM | POA: Insufficient documentation

## 2015-12-10 DIAGNOSIS — R0789 Other chest pain: Secondary | ICD-10-CM

## 2015-12-10 DIAGNOSIS — M549 Dorsalgia, unspecified: Secondary | ICD-10-CM | POA: Diagnosis not present

## 2015-12-10 DIAGNOSIS — Z7982 Long term (current) use of aspirin: Secondary | ICD-10-CM | POA: Diagnosis not present

## 2015-12-10 DIAGNOSIS — R079 Chest pain, unspecified: Principal | ICD-10-CM | POA: Diagnosis present

## 2015-12-10 DIAGNOSIS — I10 Essential (primary) hypertension: Secondary | ICD-10-CM | POA: Diagnosis present

## 2015-12-10 DIAGNOSIS — Z79899 Other long term (current) drug therapy: Secondary | ICD-10-CM | POA: Diagnosis not present

## 2015-12-10 DIAGNOSIS — R0602 Shortness of breath: Secondary | ICD-10-CM | POA: Diagnosis not present

## 2015-12-10 LAB — BASIC METABOLIC PANEL
ANION GAP: 8 (ref 5–15)
BUN: 13 mg/dL (ref 6–20)
CHLORIDE: 106 mmol/L (ref 101–111)
CO2: 25 mmol/L (ref 22–32)
Calcium: 9 mg/dL (ref 8.9–10.3)
Creatinine, Ser: 0.91 mg/dL (ref 0.61–1.24)
GFR calc non Af Amer: >60 mL/min (ref >60)
Glucose, Bld: 149 mg/dL — ABNORMAL HIGH (ref 65–99)
POTASSIUM: 3.8 mmol/L (ref 3.5–5.1)
SODIUM: 139 mmol/L (ref 135–145)

## 2015-12-10 LAB — CBC WITH DIFFERENTIAL/PLATELET
BASOS ABS: 0.1 10*3/uL (ref 0.0–0.1)
Basophils Relative: 0 %
Eosinophils Absolute: 0.1 10*3/uL (ref 0.0–0.7)
Eosinophils Relative: 1 %
HEMATOCRIT: 42.1 % (ref 39.0–52.0)
HEMOGLOBIN: 14.1 g/dL (ref 13.0–17.0)
LYMPHS ABS: 2.2 10*3/uL (ref 0.7–4.0)
LYMPHS PCT: 17 %
MCH: 32.9 pg (ref 26.0–34.0)
MCHC: 33.5 g/dL (ref 30.0–36.0)
MCV: 98.1 fL (ref 78.0–100.0)
Monocytes Absolute: 0.5 10*3/uL (ref 0.1–1.0)
Monocytes Relative: 4 %
NEUTROS ABS: 10 10*3/uL — AB (ref 1.7–7.7)
NEUTROS PCT: 78 %
Platelets: 270 10*3/uL (ref 150–400)
RBC: 4.29 MIL/uL (ref 4.22–5.81)
RDW: 13.8 % (ref 11.5–15.5)
WBC: 12.9 10*3/uL — AB (ref 4.0–10.5)

## 2015-12-10 LAB — HEPATIC FUNCTION PANEL
ALBUMIN: 3.4 g/dL — AB (ref 3.5–5.0)
ALK PHOS: 102 U/L (ref 38–126)
ALT: 36 U/L (ref 17–63)
AST: 74 U/L — ABNORMAL HIGH (ref 15–41)
Bilirubin, Direct: 0.1 mg/dL (ref 0.1–0.5)
Indirect Bilirubin: 0.4 mg/dL (ref 0.3–0.9)
TOTAL PROTEIN: 5.9 g/dL — AB (ref 6.5–8.1)
Total Bilirubin: 0.5 mg/dL (ref 0.3–1.2)

## 2015-12-10 LAB — LIPASE, BLOOD: LIPASE: 16 U/L (ref 11–51)

## 2015-12-10 LAB — I-STAT TROPONIN, ED: Troponin i, poc: 0 ng/mL (ref 0.00–0.08)

## 2015-12-10 MED ORDER — NICOTINE 21 MG/24HR TD PT24
21.0000 mg | MEDICATED_PATCH | Freq: Every day | TRANSDERMAL | Status: DC
  Filled 2015-12-10: qty 1

## 2015-12-10 MED ORDER — ACETAMINOPHEN 325 MG PO TABS: 650.0000 mg | ORAL_TABLET | ORAL | Status: DC | PRN

## 2015-12-10 MED ORDER — ONDANSETRON HCL 4 MG/2ML IJ SOLN: 4.0000 mg | Freq: Four times a day (QID) | INTRAMUSCULAR | Status: DC | PRN

## 2015-12-10 MED ORDER — PANTOPRAZOLE SODIUM 40 MG PO TBEC
40.0000 mg | DELAYED_RELEASE_TABLET | Freq: Every day | ORAL | Status: DC
  Administered 2015-12-11: 40 mg via ORAL
  Filled 2015-12-10: qty 1

## 2015-12-10 MED ORDER — ASPIRIN 81 MG PO CHEW
243.0000 mg | CHEWABLE_TABLET | Freq: Once | ORAL | Status: AC
  Administered 2015-12-10: 243 mg via ORAL
  Filled 2015-12-10: qty 3

## 2015-12-10 MED ORDER — TOPIRAMATE 100 MG PO TABS
100.0000 mg | ORAL_TABLET | Freq: Every day | ORAL | Status: DC
  Filled 2015-12-10 (×2): qty 1

## 2015-12-10 MED ORDER — ENOXAPARIN SODIUM 40 MG/0.4ML ~~LOC~~ SOLN: 40.0000 mg | SUBCUTANEOUS | Status: DC

## 2015-12-10 MED ORDER — MORPHINE SULFATE ER 30 MG PO TBCR
30.0000 mg | EXTENDED_RELEASE_TABLET | Freq: Two times a day (BID) | ORAL | Status: DC
  Administered 2015-12-11: 30 mg via ORAL
  Filled 2015-12-10: qty 1

## 2015-12-10 MED ORDER — OXYCODONE HCL 5 MG PO TABS: 15.0000 mg | ORAL_TABLET | ORAL | Status: DC | PRN

## 2015-12-10 MED ORDER — OXYCODONE HCL 5 MG PO TABS
10.0000 mg | ORAL_TABLET | Freq: Once | ORAL | Status: AC
  Administered 2015-12-10: 10 mg via ORAL
  Filled 2015-12-10: qty 2

## 2015-12-10 MED ORDER — METOPROLOL TARTRATE 25 MG PO TABS
25.0000 mg | ORAL_TABLET | Freq: Two times a day (BID) | ORAL | Status: DC
  Administered 2015-12-11: 25 mg via ORAL
  Filled 2015-12-10: qty 1

## 2015-12-10 MED ORDER — ASPIRIN EC 81 MG PO TBEC
81.0000 mg | DELAYED_RELEASE_TABLET | Freq: Every day | ORAL | Status: DC
  Administered 2015-12-11: 81 mg via ORAL
  Filled 2015-12-10: qty 1

## 2015-12-10 MED ORDER — NABUMETONE 750 MG PO TABS
750.0000 mg | ORAL_TABLET | Freq: Two times a day (BID) | ORAL | Status: DC
  Filled 2015-12-10 (×4): qty 1

## 2015-12-10 MED ORDER — METHOCARBAMOL 500 MG PO TABS
750.0000 mg | ORAL_TABLET | Freq: Three times a day (TID) | ORAL | Status: DC
  Administered 2015-12-11: 750 mg via ORAL
  Filled 2015-12-10: qty 2

## 2015-12-10 MED ORDER — GABAPENTIN 300 MG PO CAPS
300.0000 mg | ORAL_CAPSULE | Freq: Three times a day (TID) | ORAL | Status: DC
  Administered 2015-12-11: 300 mg via ORAL
  Filled 2015-12-10: qty 1

## 2015-12-10 MED ORDER — ZOLPIDEM TARTRATE 5 MG PO TABS: 10.0000 mg | ORAL_TABLET | Freq: Every evening | ORAL | Status: DC | PRN

## 2015-12-10 MED ORDER — HYDRALAZINE HCL 20 MG/ML IJ SOLN: 5.0000 mg | INTRAMUSCULAR | Status: DC | PRN

## 2015-12-10 MED ORDER — AMLODIPINE BESYLATE 5 MG PO TABS
10.0000 mg | ORAL_TABLET | Freq: Every day | ORAL | Status: DC
  Administered 2015-12-11: 10 mg via ORAL
  Filled 2015-12-10: qty 2

## 2015-12-10 NOTE — H&P (Addendum)
[facility] History and Physical  Jim Martin [id number] DOB: 1956-11-23 DOA: 12/10/2015  Referring physician: ED physician PCP: Philis Fendt, MD  Specialists:   Chief Complaint:  Chest pain, SOB  HPI: Jim Martin is a 59 y.o. male with PMH of HTN, anxiety, and narcotic-dependent chronic low back pain who presents to the ED with chest pain and SOB, onset 1900 this evening.  Jim Martin describes himself as generally healthy and was sitting in a recliner watching TV when he experienced acute-onset substernal CP that radiated down to the abdomen and around to his low back. He too a ASA 81, which he takes daily, but symptoms continued to worsen. A few minutes later, he took a second ASA 18 which he feels may have improved his symptoms some. By the time of his arrival in the ED, symptoms were described as very mild. He denies history of CAD and has never had similar symptoms prior to this. He smokes 1 ppd cigarettes, is treated for HTN, takes a daily ASA 55, and has family history of premature MI in his father and a brother. There has been no associated palpitations, cough, nausea, vomiting, fever, chills, or swelling.   In ED, patient was found to be modestly hypertensive to 160s over 90s, afebrile, and saturating well on room air with no apparent distress. EKG featured a normal sinus rhythm with no ST-T abnormalities and was not significantly changed from his prior studies. Initial troponin was undetectable, chem panel notable for serum glucose of 149, and CBC notable for mild leukocytosis to 13 thousand. The patient was given additional aspirin chew to bring total to 324 mg.  He remained stable in the ED and the hospitalists were called to admit.   Where does patient live?   At home   Can patient participate in ADLs?  Yes      Review of Systems:   General: no fevers, chills, sweats, weight change, poor appetite, or fatigue HEENT: no blurry vision, hearing changes or sore  throat Pulm: no dyspnea, cough, or wheeze CV: See HPI Abd: no nausea, vomiting, abdominal pain, diarrhea, or constipation GU: no dysuria, hematuria, increased urinary frequency, or urgency  Ext: no leg edema Neuro: no focal weakness, numbness, or tingling, no vision change or hearing loss Skin: no rash, no wounds MSK: No muscle spasm, no deformity, no red, hot, or swollen joint Heme: No easy bruising or bleeding Travel history: No recent long distant travel    Allergy: No Known Allergies  Past Medical History  Diagnosis Date  . 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 Anxiety   . Hypertension   . Chronic low back pain     Past Surgical History  Procedure Laterality Date  . Lumbosacral spine surgery    . Spinal cord stimulator implant      Social History:  reports that he has been smoking.  He has never used smokeless tobacco. He reports that he does not drink alcohol or use illicit drugs.  Family History:  Family History  Problem Relation Age of Onset  . Cancer Mother   . Heart attack Father   . Heart attack Brother      Prior to Admission medications   Medication Sig Start Date End Date Taking? Authorizing Provider  amLODipine (NORVASC) 10 MG tablet Take 10 mg by mouth daily. 09/16/13  Yes Historical Provider, MD  aspirin EC 81 MG tablet Take 81 mg by mouth daily.   Yes Historical Provider, MD  gabapentin (NEURONTIN) 300  MG capsule Take 300 mg by mouth 3 (three) times daily.   Yes Historical Provider, MD  methocarbamol (ROBAXIN) 750 MG tablet Take 750 mg by mouth 3 (three) times daily.   Yes Historical Provider, MD  morphine (MS CONTIN) 30 MG 12 hr tablet Take 30 mg by mouth 2 (two) times daily.   Yes Historical Provider, MD  nabumetone (RELAFEN) 750 MG tablet Take 750 mg by mouth 2 (two) times daily.   Yes Historical Provider, MD  oxyCODONE (ROXICODONE) 15 MG immediate release tablet Take 15 mg by mouth every 4 (four) hours as needed for pain.  02/06/15  Yes Historical Provider,  MD  topiramate (TOPAMAX) 100 MG tablet Take 1 tablet (100 mg total) by mouth at bedtime. 11/12/15  Yes Ward Givens, NP  zolpidem (AMBIEN) 10 MG tablet Take 10 mg by mouth at bedtime as needed for sleep.   Yes Historical Provider, MD    Physical Exam: Filed Vitals:   12/10/15 2215 12/10/15 2230 12/10/15 2245 12/10/15 2300  BP:  127/83  130/85  Pulse: 86 81 81 77  Temp:      TempSrc:      Resp: 17 11 13    Height:      Weight:      SpO2: 96% 90% 91% 94%   General: Not in acute distress HEENT:       Eyes: PERRL, EOMI, no scleral icterus or conjunctival pallor.       ENT: No discharge from the ears or nose, no pharyngeal ulcers, petechiae or exudate         Neck: No JVD, no bruit, no appreciable mass Heme: No cervical adenopathy, no pallor Cardiac: S1/S2, RRR, No murmurs, No gallops or rubs. Pulm: Good air movement bilaterally. No rales, wheezing, rhonchi or rubs. Abd: Soft, nondistended, nontender, no rebound pain or gaurding, no mass or organomegaly, BS present. Ext: No LE edema bilaterally. 2+DP/PT pulse bilaterally. Musculoskeletal: No gross deformity, no red, hot, swollen joints, no limitation in ROM  Skin: No rashes or wounds on exposed surfaces  Neuro: Alert, oriented X3, cranial nerves II-XII grossly intact. No focal findings Psych: Patient is not overtly psychotic.  Labs on Admission:  Basic Metabolic Panel:  Recent Labs Lab 12/10/15 2202  NA 139  K 3.8  CL 106  CO2 25  GLUCOSE 149*  BUN 13  CREATININE 0.91  CALCIUM 9.0   Liver Function Tests:  Recent Labs Lab 12/10/15 2202  AST 74*  ALT 36  ALKPHOS 102  BILITOT 0.5  PROT 5.9*  ALBUMIN 3.4*    Recent Labs Lab 12/10/15 2202  LIPASE 16   No results for input(s): AMMONIA in the last 168 hours. CBC:  Recent Labs Lab 12/10/15 2202  WBC 12.9*  NEUTROABS 10.0*  HGB 14.1  HCT 42.1  MCV 98.1  PLT 270   Cardiac Enzymes: No results for input(s): CKTOTAL, CKMB, CKMBINDEX, TROPONINI in the last  168 hours.  BNP (last 3 results) No results for input(s): BNP in the last 8760 hours.  ProBNP (last 3 results) No results for input(s): PROBNP in the last 8760 hours.  CBG: No results for input(s): GLUCAP in the last 168 hours.  Radiological Exams on Admission: Dg Chest Portable 1 View  12/10/2015  CLINICAL DATA:  Chest pain at 1900 hours today. History of hypertension and smoking. EXAM: PORTABLE CHEST 1 VIEW COMPARISON:  Chest radiograph October 15, 2007 FINDINGS: .Cardiomediastinal silhouette is unremarkable for this low inspiratory portable examination with crowded vasculature markings.  The lungs are clear without pleural effusions or focal consolidations. Trachea projects midline and there is no pneumothorax. Included soft tissue planes and osseous structures are non-suspicious. Spinal stimulator projects and lower thoracic spine. IMPRESSION: No acute pulmonary process. Electronically Signed   By: Elon Alas M.D.   On: 12/10/2015 22:16    EKG: Independently reviewed.  Normal sinus rhythm, QTc 448, no significant change from prior   Assessment/Plan  1. Chest pain, atypical  - First episode, occuring at rest  - Has HTN, smokes, FHx of premature CAD in father and brother, takes daily ASA 13 - Initial trop undetectable and EKG wnl and unchanged from priors  - Will observe on telemetry, obtain serial cardiac marker levels, repeat EKG in am and plan tentatively for NM stress test tomorrow if no trop elevations or additional episodes  - Will further risk-stratify with A1c, lipid panel - Will give beta-blocker as pt hypertensive  - MD to be called for CP or rise in cardiac biomarkers   2. Hypertension  - Continue home Norvasc  - Hypertensive in ED, will give beta-blocker as we rule-out ACS  - Monitor   3. Hyperglycemia without diagnosis of DM - Serum glucose 149 on arrival  - Check A1c  - Repeat CBG in am, consider SSI regimen if persists high     4. Chronic low back pain,  narcotic-dependent  - Status post multiple surgeries and spinal stimulator placement  - Stimulator noted on CXR, appears stable  - Continue home-dose long-acting morphine  - Continue home-dose oxycodone IR for severe breakthrough pain   ADDENDUM:   5. Tobacco abuse  - Counseled pt regarding importance of abstinence  - Pt acknowledges importance of quitting, wants to eventually, but not ready yet  - Nicotine replacement patch ordered per pt request    DVT ppx: SQ Lovenox    Code Status: Full code Family Communication: None at bed side.      Disposition Plan: Admit to observation   Date of Service 12/10/2015    Vianne Bulls, MD [facility] Pager [phone]  If 7PM-7AM, please contact night-coverage [email or web address] Password [email or web address] 12/10/2015, 11:40 PM

## 2015-12-10 NOTE — ED Notes (Signed)
Patient reports of episode of chest pain around 1900 this evening. States pain started in central chest and radiates down to mid abdomen around to back. Patient reports of pain worse during episode but mild at this time. Reports of shortness of breathe during episode. Denies at this time.

## 2015-12-10 NOTE — ED Provider Notes (Signed)
CSN: [id number]     Arrival date & time 12/10/15  2118 History   First MD Initiated Contact with Patient 12/10/15 2147     Chief Complaint  Patient presents with  . Chest Pain     Patient is a 59 y.o. male presenting with chest pain. The history is provided by the patient.  Chest Pain Pain location:  Substernal area Pain quality: pressure   Pain severity:  Moderate Onset quality:  Gradual Duration:  3 hours Timing:  Constant Progression:  Improving Chronicity:  New Relieved by:  Aspirin Worsened by:  Nothing tried Associated symptoms: shortness of breath and vomiting   Associated symptoms: no fever, no numbness, no syncope and no weakness   Risk factors: hypertension, male sex and smoking   Risk factors: no coronary artery disease and no prior DVT/PE   pt reports he had onset of lower chest pain at rest that lasted about 3 hrs It is now resolved The pain did move into upper abdomen which became sharp He reports this also worsened his chronic back pain but the CP did not move into back He has never had this before He took a baby ASA prior to arrival  Family history - positive for CAD  Past Medical History  Diagnosis Date  . 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 Anxiety   . Hypertension   . Chronic low back pain    Past Surgical History  Procedure Laterality Date  . Lumbosacral spine surgery    . Spinal cord stimulator implant     Family History  Problem Relation Age of Onset  . Cancer Mother   . Heart attack Father    Social History  Substance Use Topics  . Smoking status: Current Every Day Smoker -- 1.00 packs/day  . Smokeless tobacco: Never Used  . Alcohol Use: No    Review of Systems  Constitutional: Negative for fever.  Respiratory: Positive for shortness of breath.   Cardiovascular: Positive for chest pain. Negative for syncope.  Gastrointestinal: Positive for vomiting.  Musculoskeletal:       Chronic back pain   Neurological: Negative for weakness and  numbness.  All other systems reviewed and are negative.     Allergies  Review of patient's allergies indicates no known allergies.  Home Medications   Prior to Admission medications   Medication Sig Start Date End Date Taking? Authorizing Provider  amLODipine (NORVASC) 10 MG tablet Take 10 mg by mouth daily. 09/16/13   Historical Provider, MD  aspirin EC 81 MG tablet Take 81 mg by mouth daily.    Historical Provider, MD  gabapentin (NEURONTIN) 300 MG capsule Take 300 mg by mouth 3 (three) times daily.    Historical Provider, MD  imipramine (TOFRANIL) 25 MG tablet Take 25 mg by mouth at bedtime.    Historical Provider, MD  methocarbamol (ROBAXIN) 750 MG tablet Take 750 mg by mouth 3 (three) times daily.    Historical Provider, MD  morphine (MS CONTIN) 30 MG 12 hr tablet Take 30 mg by mouth 2 (two) times daily.    Historical Provider, MD  nabumetone (RELAFEN) 750 MG tablet Take 750 mg by mouth 2 (two) times daily.    Historical Provider, MD  oxyCODONE (ROXICODONE) 15 MG immediate release tablet 15 mg as directed. 02/06/15   Historical Provider, MD  topiramate (TOPAMAX) 100 MG tablet Take 1 tablet (100 mg total) by mouth at bedtime. 11/12/15   Ward Givens, NP  zolpidem (AMBIEN) 10 MG tablet Take 10  mg by mouth at bedtime as needed for sleep.    Historical Provider, MD   BP 163/94 mmHg  Pulse 82  Temp(Src) 98.7 F (37.1 C) (Oral)  Resp 14  Ht 5\' 8"  (1.727 m)  Wt 75.751 kg  BMI 25.40 kg/m2  SpO2 99% Physical Exam CONSTITUTIONAL: Well developed/well nourished HEAD: Normocephalic/atraumatic EYES: EOMI/PERRL ENMT: Mucous membranes moist NECK: supple no meningeal signs SPINE/BACK:entire spine nontender CV: S1/S2 noted, no murmurs/rubs/gallops noted LUNGS: Lungs are clear to auscultation bilaterally, no apparent distress ABDOMEN: soft, mild epigastric tenderness, no rebound or guarding, bowel sounds noted throughout abdomen GU:no cva tenderness NEURO: Pt is awake/alert/appropriate,  moves all extremitiesx4.  No facial droop.  No focal motor weakness noted EXTREMITIES: pulses normal/equalx4, full ROM SKIN: warm, color normal PSYCH: no abnormalities of mood noted, alert and oriented to situation   ED Course  Procedures   11:11 PM Pt without h/o CAD, but has multiple risk factors and reported chest pressure earlier tonight Initial workup negative Will admit I doubt PE/Dissection at this time given history/exam He would benefit from admission and monitoring D/w dr opyd will admit to medical service Pt agreeable with plan He requests home dose of his oxycodone   Medications  aspirin chewable tablet 243 mg (not administered)  oxyCODONE (Oxy IR/ROXICODONE) immediate release tablet 10 mg (not administered)    Labs Review Labs Reviewed  BASIC METABOLIC PANEL - Abnormal; Notable for the following:    Glucose, Bld 149 (*)    All other components within normal limits  CBC WITH DIFFERENTIAL/PLATELET - Abnormal; Notable for the following:    WBC 12.9 (*)    Neutro Abs 10.0 (*)    All other components within normal limits  HEPATIC FUNCTION PANEL - Abnormal; Notable for the following:    Total Protein 5.9 (*)    Albumin 3.4 (*)    AST 74 (*)    All other components within normal limits  LIPASE, BLOOD  I-STAT TROPOININ, ED    Imaging Review Dg Chest Portable 1 View  12/10/2015  CLINICAL DATA:  Chest pain at 1900 hours today. History of hypertension and smoking. EXAM: PORTABLE CHEST 1 VIEW COMPARISON:  Chest radiograph October 15, 2007 FINDINGS: .Cardiomediastinal silhouette is unremarkable for this low inspiratory portable examination with crowded vasculature markings. The lungs are clear without pleural effusions or focal consolidations. Trachea projects midline and there is no pneumothorax. Included soft tissue planes and osseous structures are non-suspicious. Spinal stimulator projects and lower thoracic spine. IMPRESSION: No acute pulmonary process. Electronically  Signed   By: Elon Alas M.D.   On: 12/10/2015 22:16   I have personally reviewed and evaluated these images and lab results as part of my medical decision-making.   EKG Interpretation   Date/Time:  Monday December 10 2015 21:31:46 EST Ventricular Rate:  86 PR Interval:  175 QRS Duration: 100 QT Interval:  373 QTC Calculation: 446 R Axis:   79 Text Interpretation:  Sinus rhythm Normal ECG No significant change since  last tracing Confirmed by Christy Gentles  MD, Elenore Rota ([id number]) on 12/10/2015  9:34:45 PM      MDM   Final diagnoses:  Chest pain, rule out acute myocardial infarction    Nursing notes including past medical history and social history reviewed and considered in documentation xrays/imaging reviewed by myself and considered during evaluation Labs/vital reviewed myself and considered during evaluation     Ripley Fraise, MD 12/10/15 2316

## 2015-12-11 ENCOUNTER — Encounter (HOSPITAL_COMMUNITY): Payer: Self-pay | Admitting: *Deleted

## 2015-12-11 DIAGNOSIS — R079 Chest pain, unspecified: Secondary | ICD-10-CM

## 2015-12-11 DIAGNOSIS — I10 Essential (primary) hypertension: Secondary | ICD-10-CM

## 2015-12-11 DIAGNOSIS — R0789 Other chest pain: Secondary | ICD-10-CM | POA: Diagnosis not present

## 2015-12-11 DIAGNOSIS — R739 Hyperglycemia, unspecified: Secondary | ICD-10-CM | POA: Diagnosis not present

## 2015-12-11 DIAGNOSIS — G8929 Other chronic pain: Secondary | ICD-10-CM

## 2015-12-11 DIAGNOSIS — M545 Low back pain: Secondary | ICD-10-CM | POA: Diagnosis not present

## 2015-12-11 LAB — TROPONIN I: Troponin I: <0.03 ng/mL (ref <0.031)

## 2015-12-11 LAB — LIPID PANEL
Cholesterol: 187 mg/dL (ref 0–200)
HDL: 27 mg/dL — ABNORMAL LOW (ref >40)
LDL CALC: 125 mg/dL — AB (ref 0–99)
TRIGLYCERIDES: 174 mg/dL — AB (ref <150)
Total CHOL/HDL Ratio: 6.9 RATIO
VLDL: 35 mg/dL (ref 0–40)

## 2015-12-11 MED ORDER — ENOXAPARIN SODIUM 40 MG/0.4ML ~~LOC~~ SOLN
40.0000 mg | SUBCUTANEOUS | Status: DC
  Administered 2015-12-11: 40 mg via SUBCUTANEOUS
  Filled 2015-12-11: qty 0.4

## 2015-12-11 MED ORDER — ATORVASTATIN CALCIUM 40 MG PO TABS: 40.0000 mg | ORAL_TABLET | Freq: Every day | ORAL | Status: DC

## 2015-12-11 MED ORDER — NABUMETONE 500 MG PO TABS
750.0000 mg | ORAL_TABLET | Freq: Two times a day (BID) | ORAL | Status: DC
  Administered 2015-12-11: 750 mg via ORAL
  Filled 2015-12-11 (×3): qty 2

## 2015-12-11 NOTE — Discharge Summary (Signed)
Physician Discharge Summary  RAMONT KINCHELOE [id number] DOB: 1956/10/14 DOA: 12/10/2015  PCP: Philis Fendt, MD  Admit date: 12/10/2015 Discharge date: 12/11/2015  Time spent: 40 minutes  Recommendations for Outpatient Follow-up:  1. Follow-up with primary care physician within one week. 2. Follow-up with cardiology, Dr. Harl Bowie on 12/20/2014 for evaluation of chest pain/consideration of stress test.   Discharge Diagnoses:  Active Problems:   Chest pain, rule out acute myocardial infarction   Hypertension   Chronic low back pain   Chest pain of uncertain etiology   Hyperglycemia   Discharge Condition: Stable  Diet recommendation: Heart healthy  Filed Weights   12/10/15 2137 12/11/15 0241  Weight: 75.751 kg (167 lb) 74.98 kg (165 lb 4.8 oz)    History of present illness:  Jim Martin is a 59 y.o. male with PMH of HTN, anxiety, and narcotic-dependent chronic low back pain who presents to the ED with chest pain and SOB, onset 1900 this evening. Mr. Bouwman describes himself as generally healthy and was sitting in a recliner watching TV when he experienced acute-onset substernal CP that radiated down to the abdomen and around to his low back. He too a ASA 81, which he takes daily, but symptoms continued to worsen. A few minutes later, he took a second ASA 19 which he feels may have improved his symptoms some. By the time of his arrival in the ED, symptoms were described as very mild. He denies history of CAD and has never had similar symptoms prior to this. He smokes 1 ppd cigarettes, is treated for HTN, takes a daily ASA 3, and has family history of premature MI in his father and a brother. There has been no associated palpitations, cough, nausea, vomiting, fever, chills, or swelling.   In ED, patient was found to be modestly hypertensive to 160s over 90s, afebrile, and saturating well on room air with no apparent distress. EKG featured a normal sinus rhythm with no ST-T  abnormalities and was not significantly changed from his prior studies. Initial troponin was undetectable, chem panel notable for serum glucose of 149, and CBC notable for mild leukocytosis to 13 thousand. The patient was given additional aspirin chew to bring total to 324 mg. He remained stable in the ED and the hospitalists were called to admit.   Hospital Course:   Chest pain, atypical  -Presented with atypical chest pain, dull but was not relieved by rest or nitroglycerin. -Risk factors includes HTN, smokes, FHx of premature CAD in father and brother, takes daily ASA 48 -Has -3 sets of cardiac enzymes and 12-lead EKG. -CXR is negative, very unlikely to be PE because of lack of tachycardia or hypoxia. -Scheduled to see cardiology as outpatient on January 6.  Hyperlipidemia -Total cholesterol is 187 and LDL is 125. -Started on atorvastatin 40 mg.  Hypertension  - Continue home Nor, stable.  Hyperglycemia without diagnosis of DM -Blood glucose 149, nonfasting, no history of diabetes or hyperglycemia. -A1c ordered but pending at the time of discharge.  Chronic low back pain, narcotic-dependent  - Status post multiple surgeries and spinal stimulator placement  - Stimulator noted on CXR, appears stable  - Continue home-dose long-acting morphine  - Continue home-dose oxycodone IR for severe breakthrough pain   Tobacco abuse  - Counseled pt regarding importance of abstinence  - Pt acknowledges importance of quitting, wants to eventually, but not ready yet    Procedures:  None  Consultations:  None  Discharge Exam: Filed Vitals:  12/11/15 0241 12/11/15 0645  BP: 119/86 119/72  Pulse: 78 63  Temp: 98 F (36.7 C) 98.7 F (37.1 C)  Resp: 14 18   General: Alert and awake, oriented x3, not in any acute distress. HEENT: anicteric sclera, pupils reactive to light and accommodation, EOMI CVS: S1-S2 clear, no murmur rubs or gallops Chest: clear to auscultation  bilaterally, no wheezing, rales or rhonchi Abdomen: soft nontender, nondistended, normal bowel sounds, no organomegaly Extremities: no cyanosis, clubbing or edema noted bilaterally Neuro: Cranial nerves II-XII intact, no focal neurological deficits  Discharge Instructions    Current Discharge Medication List    START taking these medications   Details  atorvastatin (LIPITOR) 40 MG tablet Take 1 tablet (40 mg total) by mouth daily. Qty: 30 tablet, Refills: 0      CONTINUE these medications which have NOT CHANGED   Details  amLODipine (NORVASC) 10 MG tablet Take 10 mg by mouth daily.    aspirin EC 81 MG tablet Take 81 mg by mouth daily.    gabapentin (NEURONTIN) 300 MG capsule Take 300 mg by mouth 3 (three) times daily.    methocarbamol (ROBAXIN) 750 MG tablet Take 750 mg by mouth 3 (three) times daily.    morphine (MS CONTIN) 30 MG 12 hr tablet Take 30 mg by mouth 2 (two) times daily.    nabumetone (RELAFEN) 750 MG tablet Take 750 mg by mouth 2 (two) times daily.    oxyCODONE (ROXICODONE) 15 MG immediate release tablet Take 15 mg by mouth every 4 (four) hours as needed for pain.  Refills: 0    topiramate (TOPAMAX) 100 MG tablet Take 1 tablet (100 mg total) by mouth at bedtime. Qty: 30 tablet, Refills: 5    zolpidem (AMBIEN) 10 MG tablet Take 10 mg by mouth at bedtime as needed for sleep.       No Known Allergies Follow-up Information    Follow up with Carlyle Dolly, MD On 12/21/2015.   Specialty:  Cardiology   Why:  1:20 NEW PATIENT-Need for stress test?    Contact information:   [address] [place] [address] [phone]        The results of significant diagnostics from this hospitalization (including imaging, microbiology, ancillary and laboratory) are listed below for reference.    Significant Diagnostic Studies: Dg Chest Portable 1 View  12/10/2015  CLINICAL DATA:  Chest pain at 1900 hours today. History of hypertension and smoking. EXAM:  PORTABLE CHEST 1 VIEW COMPARISON:  Chest radiograph October 15, 2007 FINDINGS: .Cardiomediastinal silhouette is unremarkable for this low inspiratory portable examination with crowded vasculature markings. The lungs are clear without pleural effusions or focal consolidations. Trachea projects midline and there is no pneumothorax. Included soft tissue planes and osseous structures are non-suspicious. Spinal stimulator projects and lower thoracic spine. IMPRESSION: No acute pulmonary process. Electronically Signed   By: Elon Alas M.D.   On: 12/10/2015 22:16    Microbiology: No results found for this or any previous visit (from the past 240 hour(s)).   Labs: Basic Metabolic Panel:  Recent Labs Lab 12/10/15 2202  NA 139  K 3.8  CL 106  CO2 25  GLUCOSE 149*  BUN 13  CREATININE 0.91  CALCIUM 9.0   Liver Function Tests:  Recent Labs Lab 12/10/15 2202  AST 74*  ALT 36  ALKPHOS 102  BILITOT 0.5  PROT 5.9*  ALBUMIN 3.4*    Recent Labs Lab 12/10/15 2202  LIPASE 16   No results for  input(s): AMMONIA in the last 168 hours. CBC:  Recent Labs Lab 12/10/15 2202  WBC 12.9*  NEUTROABS 10.0*  HGB 14.1  HCT 42.1  MCV 98.1  PLT 270   Cardiac Enzymes:  Recent Labs Lab 12/10/15 2347 12/11/15 0222 12/11/15 0652  TROPONINI <0.03 <0.03 <0.03   BNP: BNP (last 3 results) No results for input(s): BNP in the last 8760 hours.  ProBNP (last 3 results) No results for input(s): PROBNP in the last 8760 hours.  CBG: No results for input(s): GLUCAP in the last 168 hours.     Signed:  Birdie Hopes MD   [facility] 12/11/2015, 10:20 AM

## 2015-12-11 NOTE — Progress Notes (Signed)
IV removed. Discharge instructions reviewed with patient. Understanding verbalized. Taken out ambulating for discharge home.

## 2015-12-12 LAB — HEMOGLOBIN A1C
HEMOGLOBIN A1C: 5.5 % (ref 4.8–5.6)
Mean Plasma Glucose: 111 mg/dL

## 2015-12-21 ENCOUNTER — Ambulatory Visit (INDEPENDENT_AMBULATORY_CARE_PROVIDER_SITE_OTHER): Payer: PPO | Admitting: Cardiology

## 2015-12-21 ENCOUNTER — Encounter: Payer: Self-pay | Admitting: Cardiology

## 2015-12-21 VITALS — BP 130/78 | HR 95 | Ht 68.0 in | Wt 167.0 lb

## 2015-12-21 DIAGNOSIS — R072 Precordial pain: Secondary | ICD-10-CM

## 2015-12-21 NOTE — Progress Notes (Signed)
Patient ID: ALBUS TUNNICLIFF, male   DOB: 05-04-1956, 60 y.o.   MRN: [id number]     Clinical Summary Mr. Radakovich is a 60 y.o.male seen today as a new patient for the following medical problems.  1. Chest pain - recent admit late 11/2015 with chest pain. Ruled out for ACS.  - pain started about 1.5 weeks ago. Squeezing pain midchest, 9/10. Occurred while at rest in a recliner. No other associated symptoms. Lasted for 2 hours constantly. Not positional. +SOB. Pain radiated to lower back.  - no repeat episodes.  - notes some DOE with activities, though also limited by severe back pain and leg pains.   CAD risk factors: HTN, hyperlipidemia, + tobacco, father MI 28, brother heart stent age 30     Past Medical History  Diagnosis Date  . 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 Anxiety   . Hypertension   . Chronic low back pain      No Known Allergies   Current Outpatient Prescriptions  Medication Sig Dispense Refill  . amLODipine (NORVASC) 10 MG tablet Take 10 mg by mouth daily.    Marland Kitchen aspirin EC 81 MG tablet Take 81 mg by mouth daily.    Marland Kitchen atorvastatin (LIPITOR) 40 MG tablet Take 1 tablet (40 mg total) by mouth daily. 30 tablet 0  . gabapentin (NEURONTIN) 300 MG capsule Take 300 mg by mouth 3 (three) times daily.    . methocarbamol (ROBAXIN) 750 MG tablet Take 750 mg by mouth 3 (three) times daily.    Marland Kitchen morphine (MS CONTIN) 30 MG 12 hr tablet Take 30 mg by mouth 2 (two) times daily.    . nabumetone (RELAFEN) 750 MG tablet Take 750 mg by mouth 2 (two) times daily.    Marland Kitchen oxyCODONE (ROXICODONE) 15 MG immediate release tablet Take 15 mg by mouth every 4 (four) hours as needed for pain.   0  . topiramate (TOPAMAX) 100 MG tablet Take 1 tablet (100 mg total) by mouth at bedtime. 30 tablet 5  . zolpidem (AMBIEN) 10 MG tablet Take 10 mg by mouth at bedtime as needed for sleep.     No current facility-administered medications for this visit.     Past Surgical History  Procedure Laterality Date  .  Lumbosacral spine surgery    . Spinal cord stimulator implant       No Known Allergies    Family History  Problem Relation Age of Onset  . Cancer Mother   . Heart attack Father   . Heart attack Brother   Two brothers with MIs  Social History Mr. Aramburo reports that he has been smoking.  He has never used smokeless tobacco. Mr. Shor reports that he does not drink alcohol.   Review of Systems CONSTITUTIONAL: No weight loss, fever, chills, weakness or fatigue.  HEENT: Eyes: No visual loss, blurred vision, double vision or yellow sclerae.No hearing loss, sneezing, congestion, runny nose or sore throat.  SKIN: No rash or itching.  CARDIOVASCULAR: per hpi RESPIRATORY: No shortness of breath, cough or sputum.  GASTROINTESTINAL: No anorexia, nausea, vomiting or diarrhea. No abdominal pain or blood.  GENITOURINARY: No burning on urination, no polyuria NEUROLOGICAL: No headache, dizziness, syncope, paralysis, ataxia, numbness or tingling in the extremities. No change in bowel or bladder control.  MUSCULOSKELETAL: No muscle, back pain, joint pain or stiffness.  LYMPHATICS: No enlarged nodes. No history of splenectomy.  PSYCHIATRIC: No history of depression or anxiety.  ENDOCRINOLOGIC: No reports of sweating, cold or heat  intolerance. No polyuria or polydipsia.  Marland Kitchen   Physical Examination Filed Vitals:   12/21/15 0911  BP: 130/78  Pulse: 95   Filed Vitals:   12/21/15 0911  Height: 5\' 8"  (1.727 m)  Weight: 167 lb (75.751 kg)    Gen: resting comfortably, no acute distress HEENT: no scleral icterus, pupils equal round and reactive, no palptable cervical adenopathy,  CV: RRR, no m/r/g, no jvd Resp: Clear to auscultation bilaterally GI: abdomen is soft, non-tender, non-distended, normal bowel sounds, no hepatosplenomegaly MSK: extremities are warm, no edema.  Skin: warm, no rash Neuro:  no focal deficits Psych: appropriate affect   Diagnostic Studies EKG 12/11/15: EKG  NSR    Assessment and Plan   1. Chest pain - somewhat atypical symptoms, he however has several risk factors including a strong family history of CAD - will obtian lexiscan MPI, he is not able to run on treadmill due to chronic severe lower back pain.   F/u pending stress results.   Arnoldo Lenis, M.D.

## 2015-12-21 NOTE — Patient Instructions (Signed)
Your physician recommends that you schedule a follow-up appointment in:  To be determined after test   Your physician has requested that you have a lexiscan myoview. For further information please visit [email or web address]. Please follow instruction sheet, as given.   HOLD NORVASC THE MORNING OF TEST    Thank you for choosing [facility] !

## 2015-12-28 DIAGNOSIS — G8929 Other chronic pain: Secondary | ICD-10-CM | POA: Diagnosis not present

## 2015-12-28 DIAGNOSIS — M545 Low back pain: Secondary | ICD-10-CM | POA: Diagnosis not present

## 2015-12-31 ENCOUNTER — Encounter (HOSPITAL_COMMUNITY): Payer: PPO

## 2015-12-31 ENCOUNTER — Ambulatory Visit (HOSPITAL_COMMUNITY): Payer: PPO

## 2016-01-04 ENCOUNTER — Encounter (HOSPITAL_COMMUNITY)
Admission: RE | Admit: 2016-01-04 | Discharge: 2016-01-04 | Disposition: A | Discharge status: Home / Self Care | Payer: PPO | Source: Ambulatory Visit | Attending: Cardiology | Admitting: Cardiology

## 2016-01-04 ENCOUNTER — Encounter (HOSPITAL_COMMUNITY): Payer: Self-pay

## 2016-01-04 ENCOUNTER — Inpatient Hospital Stay (HOSPITAL_COMMUNITY): Admission: RE | Admit: 2016-01-04 | Payer: PPO | Source: Ambulatory Visit

## 2016-01-04 DIAGNOSIS — R072 Precordial pain: Secondary | ICD-10-CM | POA: Diagnosis not present

## 2016-01-04 DIAGNOSIS — R079 Chest pain, unspecified: Secondary | ICD-10-CM | POA: Diagnosis not present

## 2016-01-04 LAB — NM MYOCAR MULTI W/SPECT W/WALL MOTION / EF
CHL CUP NUCLEAR SDS: 1
CHL CUP NUCLEAR SSS: 6
LHR: 0.26
LV dias vol: 113 mL
LV sys vol: 45 mL
Peak HR: 92 {beats}/min
Rest HR: 65 {beats}/min
SRS: 5
TID: 0.99

## 2016-01-04 MED ORDER — REGADENOSON 0.4 MG/5ML IV SOLN
INTRAVENOUS | Status: AC
  Administered 2016-01-04: 0.4 mg via INTRAVENOUS
  Filled 2016-01-04: qty 5

## 2016-01-04 MED ORDER — TECHNETIUM TC 99M SESTAMIBI - CARDIOLITE
10.0000 | Freq: Once | INTRAVENOUS | Status: AC | PRN
  Administered 2016-01-04: 10 via INTRAVENOUS

## 2016-01-04 MED ORDER — TECHNETIUM TC 99M SESTAMIBI GENERIC - CARDIOLITE
30.0000 | Freq: Once | INTRAVENOUS | Status: AC | PRN
  Administered 2016-01-04: 31.8 via INTRAVENOUS

## 2016-01-04 MED ORDER — SODIUM CHLORIDE 0.9 % IJ SOLN
INTRAMUSCULAR | Status: AC
  Administered 2016-01-04: 10 mL via INTRAVENOUS
  Filled 2016-01-04: qty 3

## 2016-01-08 ENCOUNTER — Telehealth: Payer: Self-pay

## 2016-01-08 NOTE — Telephone Encounter (Signed)
Mailed pt a letter and copy of his stress test results. His phone number listed in the chart would not ring- only a fast busy signal. Made his follow up appointment for 02/11/16 @ 9:20 with JB

## 2016-01-08 NOTE — Telephone Encounter (Signed)
-----   Message from Arnoldo Lenis, MD sent at 01/08/2016  7:56 AM EST ----- Stress test looks good, he should f/u with me in 1 month to discuss in detail and reevaluate his symptoms  Zandra Abts MD

## 2016-01-29 DIAGNOSIS — M545 Low back pain: Secondary | ICD-10-CM | POA: Diagnosis not present

## 2016-01-29 DIAGNOSIS — M79662 Pain in left lower leg: Secondary | ICD-10-CM | POA: Diagnosis not present

## 2016-01-29 DIAGNOSIS — M542 Cervicalgia: Secondary | ICD-10-CM | POA: Diagnosis not present

## 2016-01-29 DIAGNOSIS — G894 Chronic pain syndrome: Secondary | ICD-10-CM | POA: Diagnosis not present

## 2016-01-29 DIAGNOSIS — M25562 Pain in left knee: Secondary | ICD-10-CM | POA: Diagnosis not present

## 2016-01-29 DIAGNOSIS — M25561 Pain in right knee: Secondary | ICD-10-CM | POA: Diagnosis not present

## 2016-01-29 DIAGNOSIS — Z79891 Long term (current) use of opiate analgesic: Secondary | ICD-10-CM | POA: Diagnosis not present

## 2016-01-29 DIAGNOSIS — M79661 Pain in right lower leg: Secondary | ICD-10-CM | POA: Diagnosis not present

## 2016-02-11 ENCOUNTER — Encounter: Payer: Self-pay | Admitting: Cardiology

## 2016-02-11 ENCOUNTER — Encounter: Payer: PPO | Admitting: Cardiology

## 2016-02-11 NOTE — Progress Notes (Signed)
Patient ID: Jim Martin, male   DOB: 11-18-1956, 60 y.o.   MRN: [id number] Patient ID: Jim Martin, male   DOB: [birth year], 60 y.o.   MRN: [id number]   Encounter opened in error

## 2016-03-25 DIAGNOSIS — M25562 Pain in left knee: Secondary | ICD-10-CM | POA: Diagnosis not present

## 2016-03-25 DIAGNOSIS — G894 Chronic pain syndrome: Secondary | ICD-10-CM | POA: Diagnosis not present

## 2016-03-25 DIAGNOSIS — M79662 Pain in left lower leg: Secondary | ICD-10-CM | POA: Diagnosis not present

## 2016-03-25 DIAGNOSIS — M79661 Pain in right lower leg: Secondary | ICD-10-CM | POA: Diagnosis not present

## 2016-03-25 DIAGNOSIS — G890 Central pain syndrome: Secondary | ICD-10-CM | POA: Diagnosis not present

## 2016-03-25 DIAGNOSIS — F1120 Opioid dependence, uncomplicated: Secondary | ICD-10-CM | POA: Diagnosis not present

## 2016-03-25 DIAGNOSIS — M25561 Pain in right knee: Secondary | ICD-10-CM | POA: Diagnosis not present

## 2016-03-25 DIAGNOSIS — M545 Low back pain: Secondary | ICD-10-CM | POA: Diagnosis not present

## 2016-04-01 DIAGNOSIS — F1120 Opioid dependence, uncomplicated: Secondary | ICD-10-CM | POA: Diagnosis not present

## 2016-04-01 DIAGNOSIS — G894 Chronic pain syndrome: Secondary | ICD-10-CM | POA: Diagnosis not present

## 2016-04-01 DIAGNOSIS — M25562 Pain in left knee: Secondary | ICD-10-CM | POA: Diagnosis not present

## 2016-04-01 DIAGNOSIS — G890 Central pain syndrome: Secondary | ICD-10-CM | POA: Diagnosis not present

## 2016-04-01 DIAGNOSIS — M79661 Pain in right lower leg: Secondary | ICD-10-CM | POA: Diagnosis not present

## 2016-04-01 DIAGNOSIS — M79662 Pain in left lower leg: Secondary | ICD-10-CM | POA: Diagnosis not present

## 2016-04-01 DIAGNOSIS — M545 Low back pain: Secondary | ICD-10-CM | POA: Diagnosis not present

## 2016-04-01 DIAGNOSIS — M25561 Pain in right knee: Secondary | ICD-10-CM | POA: Diagnosis not present

## 2016-04-18 DIAGNOSIS — Z1389 Encounter for screening for other disorder: Secondary | ICD-10-CM | POA: Diagnosis not present

## 2016-04-18 DIAGNOSIS — Z13220 Encounter for screening for lipoid disorders: Secondary | ICD-10-CM | POA: Diagnosis not present

## 2016-04-18 DIAGNOSIS — S71009A Unspecified open wound, unspecified hip, initial encounter: Secondary | ICD-10-CM | POA: Diagnosis not present

## 2016-04-18 DIAGNOSIS — Q245 Malformation of coronary vessels: Secondary | ICD-10-CM | POA: Diagnosis not present

## 2016-04-18 DIAGNOSIS — Z131 Encounter for screening for diabetes mellitus: Secondary | ICD-10-CM | POA: Diagnosis not present

## 2016-04-18 DIAGNOSIS — F17210 Nicotine dependence, cigarettes, uncomplicated: Secondary | ICD-10-CM | POA: Diagnosis not present

## 2016-04-18 DIAGNOSIS — G894 Chronic pain syndrome: Secondary | ICD-10-CM | POA: Diagnosis not present

## 2016-04-18 DIAGNOSIS — I10 Essential (primary) hypertension: Secondary | ICD-10-CM | POA: Diagnosis not present

## 2016-04-18 DIAGNOSIS — Z125 Encounter for screening for malignant neoplasm of prostate: Secondary | ICD-10-CM | POA: Diagnosis not present

## 2016-05-08 ENCOUNTER — Other Ambulatory Visit: Payer: Self-pay | Admitting: Neurology

## 2016-05-14 ENCOUNTER — Ambulatory Visit: Payer: Medicare Other | Admitting: Neurology

## 2016-05-14 ENCOUNTER — Telehealth: Payer: Self-pay

## 2016-05-14 NOTE — Telephone Encounter (Signed)
Pt no-showed 6 mo f/u appt today.

## 2016-05-15 DEATH — deceased

## 2016-05-16 ENCOUNTER — Encounter: Payer: Self-pay | Admitting: Neurology

## 2016-05-23 DIAGNOSIS — Q245 Malformation of coronary vessels: Secondary | ICD-10-CM | POA: Diagnosis not present

## 2016-05-23 DIAGNOSIS — G894 Chronic pain syndrome: Secondary | ICD-10-CM | POA: Diagnosis not present

## 2016-05-23 DIAGNOSIS — I10 Essential (primary) hypertension: Secondary | ICD-10-CM | POA: Diagnosis not present

## 2016-05-23 DIAGNOSIS — F17210 Nicotine dependence, cigarettes, uncomplicated: Secondary | ICD-10-CM | POA: Diagnosis not present

## 2016-05-29 DIAGNOSIS — M25552 Pain in left hip: Secondary | ICD-10-CM | POA: Diagnosis not present

## 2016-05-29 DIAGNOSIS — Z79891 Long term (current) use of opiate analgesic: Secondary | ICD-10-CM | POA: Diagnosis not present

## 2016-05-29 DIAGNOSIS — M5416 Radiculopathy, lumbar region: Secondary | ICD-10-CM | POA: Diagnosis not present

## 2016-05-29 DIAGNOSIS — G894 Chronic pain syndrome: Secondary | ICD-10-CM | POA: Diagnosis not present

## 2016-05-29 DIAGNOSIS — M79662 Pain in left lower leg: Secondary | ICD-10-CM | POA: Diagnosis not present

## 2016-05-29 DIAGNOSIS — M79604 Pain in right leg: Secondary | ICD-10-CM | POA: Diagnosis not present

## 2016-05-29 DIAGNOSIS — M79605 Pain in left leg: Secondary | ICD-10-CM | POA: Diagnosis not present

## 2016-05-29 DIAGNOSIS — M25551 Pain in right hip: Secondary | ICD-10-CM | POA: Diagnosis not present

## 2016-05-29 DIAGNOSIS — M79661 Pain in right lower leg: Secondary | ICD-10-CM | POA: Diagnosis not present

## 2016-06-19 DIAGNOSIS — G894 Chronic pain syndrome: Secondary | ICD-10-CM | POA: Diagnosis not present

## 2016-06-19 DIAGNOSIS — M5416 Radiculopathy, lumbar region: Secondary | ICD-10-CM | POA: Diagnosis not present

## 2016-06-19 DIAGNOSIS — M25551 Pain in right hip: Secondary | ICD-10-CM | POA: Diagnosis not present

## 2016-06-19 DIAGNOSIS — Z79891 Long term (current) use of opiate analgesic: Secondary | ICD-10-CM | POA: Diagnosis not present

## 2016-06-19 DIAGNOSIS — M25552 Pain in left hip: Secondary | ICD-10-CM | POA: Diagnosis not present

## 2016-06-19 DIAGNOSIS — M79604 Pain in right leg: Secondary | ICD-10-CM | POA: Diagnosis not present

## 2016-06-20 ENCOUNTER — Ambulatory Visit (INDEPENDENT_AMBULATORY_CARE_PROVIDER_SITE_OTHER): Payer: PPO | Admitting: Neurology

## 2016-06-20 ENCOUNTER — Encounter: Payer: Self-pay | Admitting: Neurology

## 2016-06-20 VITALS — BP 156/88 | HR 64 | Ht 68.0 in | Wt 165.5 lb

## 2016-06-20 DIAGNOSIS — G43109 Migraine with aura, not intractable, without status migrainosus: Secondary | ICD-10-CM | POA: Diagnosis not present

## 2016-06-20 MED ORDER — TOPIRAMATE 100 MG PO TABS: 100.0000 mg | ORAL_TABLET | Freq: Every day | ORAL | Status: DC

## 2016-06-20 NOTE — Progress Notes (Signed)
Reason for visit: Basilar migraine  Jim Martin is an 60 y.o. male  History of present illness:  Jim Martin is a 60 year old left-handed white male with a history of basilar migraine. The patient has also had some chronic pain issues with the low back, he is followed through a pain center. He reports that one of the biggest activators for his headaches is at the looks up, he can induce neck discomfort, and then the headache will ensue associated with slurred speech and difficulty with balance. The patient indicates that he has not had any events in greater than 1 month. Some of the events are quite brief lasting only 15-20 minutes, the other events may last several hours. The patient has gained improvement with the Topamax. MRI of the brain in the past has been unremarkable. The patient indicates that he has had an evaluation of the cervical spine at some point, I do not have the results of this. He has a spinal stimulator in for his low back pain, he cannot have MRI at this time. The patient returns for an evaluation.  Past Medical History  Diagnosis Date  . 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 Anxiety   . Hypertension   . Chronic low back pain     Past Surgical History  Procedure Laterality Date  . Lumbosacral spine surgery    . Spinal cord stimulator implant      Family History  Problem Relation Age of Onset  . Cancer Mother   . Heart attack Father   . Heart attack Brother     Social history:  reports that he has been smoking.  He has never used smokeless tobacco. He reports that he does not drink alcohol or use illicit drugs.   No Known Allergies  Medications:  Prior to Admission medications   Medication Sig Start Date End Date Taking? Authorizing Provider  amLODipine (NORVASC) 10 MG tablet Take 10 mg by mouth daily. 09/16/13  Yes Historical Provider, MD  aspirin EC 81 MG tablet Take 81 mg by mouth daily.   Yes Historical Provider, MD  atorvastatin (LIPITOR) 40 MG tablet  Take 1 tablet (40 mg total) by mouth daily. 12/11/15  Yes Clydia LlanoMutaz Elmahi, MD  gabapentin (NEURONTIN) 300 MG capsule Take 300 mg by mouth 3 (three) times daily.   Yes Historical Provider, MD  methocarbamol (ROBAXIN) 750 MG tablet Take 750 mg by mouth 3 (three) times daily.   Yes Historical Provider, MD  morphine (MS CONTIN) 30 MG 12 hr tablet Take 30 mg by mouth 2 (two) times daily.   Yes Historical Provider, MD  nabumetone (RELAFEN) 750 MG tablet Take 750 mg by mouth 2 (two) times daily.   Yes Historical Provider, MD  oxyCODONE (ROXICODONE) 15 MG immediate release tablet Take 15 mg by mouth every 4 (four) hours as needed for pain.  02/06/15  Yes Historical Provider, MD  topiramate (TOPAMAX) 100 MG tablet TAKE ONE TABLET BY MOUTH AT BEDTIME. 05/08/16  Yes York Spanielharles K Wray Goehring, MD  zolpidem (AMBIEN) 10 MG tablet Take 10 mg by mouth at bedtime as needed for sleep.   Yes Historical Provider, MD    ROS:  Out of a complete 14 system review of symptoms, the patient complains only of the following symptoms, and all other reviewed systems are negative.  Shortness of breath Restless legs Joint pain, back pain, achy muscles, walking difficulty, neck pain, neck stiffness Headache, weakness  Blood pressure 156/88, pulse 64, height 5\' 8"  (1.727 m),  weight 165 lb 8 oz (75.07 kg).  Physical Exam  General: The patient is alert and cooperative at the time of the examination.  Skin: No significant peripheral edema is noted.   Neurologic Exam  Mental status: The patient is alert and oriented x 3 at the time of the examination. The patient has apparent normal recent and remote memory, with an apparently normal attention span and concentration ability.   Cranial nerves: Facial symmetry is present. Speech is slightly dysarthric, not aphasic. Extraocular movements are full. The patient has prominent bilateral horizontal nystagmus. Visual fields are full.  Motor: The patient has good strength in all 4  extremities.  Sensory examination: Soft touch sensation is symmetric on the face, arms, and legs.  Coordination: The patient has good finger-nose-finger and heel-to-shin bilaterally.  Gait and station: The patient has a slightly wide-based gait. Tandem gait is slightly unsteady. Romberg is negative. No drift is seen.  Reflexes: Deep tendon reflexes are symmetric.   Assessment/Plan:  1. Basilar migraine  2. Chronic low back pain  The patient appears to have some issues with neck stiffness and pain, his headaches can be induced by looking up. The patient has gained some benefit with Topamax, we will continue the medication for now. Medication dosing can be increased if needed. He will follow-up in 9 months, a prescription for the Topamax was written.  [name] 06/20/2016 2:55 PM  [facility] [address] [place], [place] [address]  Phone [phone] Fax [phone]

## 2016-07-29 DIAGNOSIS — Z79891 Long term (current) use of opiate analgesic: Secondary | ICD-10-CM | POA: Diagnosis not present

## 2016-07-29 DIAGNOSIS — M79604 Pain in right leg: Secondary | ICD-10-CM | POA: Diagnosis not present

## 2016-07-29 DIAGNOSIS — M25551 Pain in right hip: Secondary | ICD-10-CM | POA: Diagnosis not present

## 2016-07-29 DIAGNOSIS — M5416 Radiculopathy, lumbar region: Secondary | ICD-10-CM | POA: Diagnosis not present

## 2016-07-29 DIAGNOSIS — G890 Central pain syndrome: Secondary | ICD-10-CM | POA: Diagnosis not present

## 2016-07-29 DIAGNOSIS — M25552 Pain in left hip: Secondary | ICD-10-CM | POA: Diagnosis not present

## 2016-07-29 DIAGNOSIS — G894 Chronic pain syndrome: Secondary | ICD-10-CM | POA: Diagnosis not present

## 2016-08-22 DIAGNOSIS — I10 Essential (primary) hypertension: Secondary | ICD-10-CM | POA: Diagnosis not present

## 2016-08-22 DIAGNOSIS — Q245 Malformation of coronary vessels: Secondary | ICD-10-CM | POA: Diagnosis not present

## 2016-08-22 DIAGNOSIS — Z23 Encounter for immunization: Secondary | ICD-10-CM | POA: Diagnosis not present

## 2016-08-22 DIAGNOSIS — F17210 Nicotine dependence, cigarettes, uncomplicated: Secondary | ICD-10-CM | POA: Diagnosis not present

## 2016-08-22 DIAGNOSIS — G894 Chronic pain syndrome: Secondary | ICD-10-CM | POA: Diagnosis not present

## 2016-08-26 DIAGNOSIS — Z79891 Long term (current) use of opiate analgesic: Secondary | ICD-10-CM | POA: Diagnosis not present

## 2016-08-26 DIAGNOSIS — M79662 Pain in left lower leg: Secondary | ICD-10-CM | POA: Diagnosis not present

## 2016-08-26 DIAGNOSIS — M25552 Pain in left hip: Secondary | ICD-10-CM | POA: Diagnosis not present

## 2016-08-26 DIAGNOSIS — G894 Chronic pain syndrome: Secondary | ICD-10-CM | POA: Diagnosis not present

## 2016-08-26 DIAGNOSIS — M79661 Pain in right lower leg: Secondary | ICD-10-CM | POA: Diagnosis not present

## 2016-08-26 DIAGNOSIS — M545 Low back pain: Secondary | ICD-10-CM | POA: Diagnosis not present

## 2016-08-26 DIAGNOSIS — M79622 Pain in left upper arm: Secondary | ICD-10-CM | POA: Diagnosis not present

## 2016-08-26 DIAGNOSIS — M5416 Radiculopathy, lumbar region: Secondary | ICD-10-CM | POA: Diagnosis not present

## 2016-08-26 DIAGNOSIS — M542 Cervicalgia: Secondary | ICD-10-CM | POA: Diagnosis not present

## 2016-08-26 DIAGNOSIS — M79621 Pain in right upper arm: Secondary | ICD-10-CM | POA: Diagnosis not present

## 2016-09-02 ENCOUNTER — Emergency Department (HOSPITAL_COMMUNITY)
Admission: EM | Admit: 2016-09-02 | Discharge: 2016-09-02 | Disposition: A | Discharge status: Home / Self Care | Payer: PPO | Attending: Emergency Medicine | Admitting: Emergency Medicine

## 2016-09-02 ENCOUNTER — Encounter (HOSPITAL_COMMUNITY): Payer: Self-pay | Admitting: Emergency Medicine

## 2016-09-02 ENCOUNTER — Emergency Department (HOSPITAL_COMMUNITY): Payer: PPO

## 2016-09-02 DIAGNOSIS — F17200 Nicotine dependence, unspecified, uncomplicated: Secondary | ICD-10-CM | POA: Diagnosis not present

## 2016-09-02 DIAGNOSIS — Z79899 Other long term (current) drug therapy: Secondary | ICD-10-CM | POA: Diagnosis not present

## 2016-09-02 DIAGNOSIS — R1013 Epigastric pain: Secondary | ICD-10-CM | POA: Insufficient documentation

## 2016-09-02 DIAGNOSIS — M549 Dorsalgia, unspecified: Secondary | ICD-10-CM | POA: Insufficient documentation

## 2016-09-02 DIAGNOSIS — R112 Nausea with vomiting, unspecified: Secondary | ICD-10-CM | POA: Diagnosis not present

## 2016-09-02 DIAGNOSIS — M2550 Pain in unspecified joint: Secondary | ICD-10-CM | POA: Diagnosis not present

## 2016-09-02 DIAGNOSIS — R450 Nervousness: Secondary | ICD-10-CM | POA: Diagnosis not present

## 2016-09-02 DIAGNOSIS — I10 Essential (primary) hypertension: Secondary | ICD-10-CM | POA: Diagnosis not present

## 2016-09-02 DIAGNOSIS — Z7982 Long term (current) use of aspirin: Secondary | ICD-10-CM | POA: Insufficient documentation

## 2016-09-02 DIAGNOSIS — R079 Chest pain, unspecified: Secondary | ICD-10-CM | POA: Diagnosis not present

## 2016-09-02 DIAGNOSIS — R0602 Shortness of breath: Secondary | ICD-10-CM | POA: Diagnosis not present

## 2016-09-02 DIAGNOSIS — R61 Generalized hyperhidrosis: Secondary | ICD-10-CM | POA: Diagnosis not present

## 2016-09-02 DIAGNOSIS — R51 Headache: Secondary | ICD-10-CM | POA: Insufficient documentation

## 2016-09-02 DIAGNOSIS — R0789 Other chest pain: Secondary | ICD-10-CM | POA: Insufficient documentation

## 2016-09-02 LAB — HEPATIC FUNCTION PANEL
ALBUMIN: 4.6 g/dL (ref 3.5–5.0)
ALK PHOS: 128 U/L — AB (ref 38–126)
ALT: 197 U/L — ABNORMAL HIGH (ref 17–63)
AST: 308 U/L — AB (ref 15–41)
BILIRUBIN TOTAL: 2.2 mg/dL — AB (ref 0.3–1.2)
Bilirubin, Direct: 1.2 mg/dL — ABNORMAL HIGH (ref 0.1–0.5)
Indirect Bilirubin: 1 mg/dL — ABNORMAL HIGH (ref 0.3–0.9)
Total Protein: 7.8 g/dL (ref 6.5–8.1)

## 2016-09-02 LAB — CBC
HEMATOCRIT: 50.5 % (ref 39.0–52.0)
Hemoglobin: 17 g/dL (ref 13.0–17.0)
MCH: 31.7 pg (ref 26.0–34.0)
MCHC: 33.7 g/dL (ref 30.0–36.0)
MCV: 94.2 fL (ref 78.0–100.0)
Platelets: 225 10*3/uL (ref 150–400)
RBC: 5.36 MIL/uL (ref 4.22–5.81)
RDW: 13.8 % (ref 11.5–15.5)
WBC: 10.3 10*3/uL (ref 4.0–10.5)

## 2016-09-02 LAB — BASIC METABOLIC PANEL
BUN: 14 mg/dL (ref 6–20)
CHLORIDE: 107 mmol/L (ref 101–111)
CO2: 27 mmol/L (ref 22–32)
Calcium: 9.1 mg/dL (ref 8.9–10.3)
Creatinine, Ser: 1.02 mg/dL (ref 0.61–1.24)
GFR calc non Af Amer: >60 mL/min (ref >60)
Glucose, Bld: 123 mg/dL — ABNORMAL HIGH (ref 65–99)
POTASSIUM: 3.5 mmol/L (ref 3.5–5.1)
SODIUM: 136 mmol/L (ref 135–145)

## 2016-09-02 LAB — TROPONIN I: Troponin I: <0.03 ng/mL (ref <0.03)

## 2016-09-02 LAB — LIPASE, BLOOD: Lipase: 15 U/L (ref 11–51)

## 2016-09-02 MED ORDER — RANITIDINE HCL 150 MG PO TABS
150.0000 mg | ORAL_TABLET | Freq: Two times a day (BID) | ORAL | 0 refills | Status: DC
Start: 2016-09-02 — End: 2020-04-15

## 2016-09-02 MED ORDER — PANTOPRAZOLE SODIUM 20 MG PO TBEC: 20.0000 mg | DELAYED_RELEASE_TABLET | Freq: Every day | ORAL | 0 refills | Status: DC

## 2016-09-02 NOTE — Discharge Instructions (Signed)
Your electrocardiogram and your cardiac enzymes are negative for acute cardiac event. There is a very good possibility that this may be related to esophagitis, or related issue. Please see Dr. Oneida Alar for additional evaluation concerning this discomfort. Please return to the emergency department immediately if any changes, problems, or concerns. Please use Protonix daily, and Zantac 2 times daily until seen by Dr. Oneida Alar.

## 2016-09-02 NOTE — ED Provider Notes (Signed)
[facility] DEPT Provider Note   CSN: [id number] Arrival date & time: 09/02/16  [id number]     History   Chief Complaint Chief Complaint  Patient presents with  . Chest Pain    HPI Jim Martin is a 60 y.o. male.  Pt is a 60 y/o male who presents to the ED with a c/o epigastric area  And chest. Pt states this pain started on last night. He could not find a comfortable position. He had pain from the epigastric area to the center chest. He had one episode that caused him to sweat and he had one episode of vomiting. He slept in a chair sitting up. The pain is better this AM, but not resolved.  NO LOC. No altered mental state. No high fever or chills, and no recent blood in sputum.   The history is provided by the patient.    Past Medical History:  Diagnosis Date  . Anxiety   . 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 Chronic low back pain   . Hypertension     Patient Active Problem List   Diagnosis Date Noted  . Chest pain, rule out acute myocardial infarction 12/10/2015  . Hypertension 12/10/2015  . Chronic low back pain 12/10/2015  . Anxiety 12/10/2015  . Chest pain of uncertain etiology [id number]  . Hyperglycemia 12/10/2015  . 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 lumbosacral spine surgery    . SPINAL CORD STIMULATOR IMPLANT         Home Medications    Prior to Admission medications   Medication Sig Start Date End Date Taking? Authorizing Provider  amLODipine (NORVASC) 10 MG tablet Take 10 mg by mouth daily. 09/16/13  Yes Historical Provider, MD  aspirin EC 81 MG tablet Take 81 mg by mouth daily.   Yes Historical Provider, MD  methocarbamol (ROBAXIN) 750 MG tablet Take 750 mg by mouth 3 (three) times daily.   Yes Historical Provider, MD  morphine (MS CONTIN) 30 MG 12 hr tablet Take 30 mg by mouth 2 (two) times daily.   Yes Historical Provider, MD  nabumetone (RELAFEN) 750 MG tablet Take 750 mg by mouth 2 (two) times  daily.   Yes Historical Provider, MD  oxyCODONE (ROXICODONE) 15 MG immediate release tablet Take 15 mg by mouth 5 (five) times daily.  02/06/15  Yes Historical Provider, MD  topiramate (TOPAMAX) 100 MG tablet Take 1 tablet (100 mg total) by mouth at bedtime. 06/20/16  Yes Kathrynn Ducking, MD  zolpidem (AMBIEN) 10 MG tablet Take 10 mg by mouth at bedtime as needed for sleep.   Yes Historical Provider, MD    Family History Family History  Problem Relation Age of Onset  . Cancer Mother   . Heart attack Father   . Heart attack Brother     Social History Social History  Substance Use Topics  . Smoking status: Current Every Day Smoker    Packs/day: 1.00  . Smokeless tobacco: Never Used  . Alcohol use No     Allergies   Review of patient's allergies indicates no known allergies.   Review of Systems Review of Systems  Constitutional: Positive for diaphoresis. Negative for chills and fever.  Respiratory: Negative for wheezing.   Cardiovascular: Positive for chest pain. Negative for palpitations and leg swelling.  Gastrointestinal: Positive for abdominal pain and nausea. Negative for abdominal distention, blood in stool, diarrhea and vomiting.  Musculoskeletal: Positive for arthralgias and back pain.  Neurological: Positive for headaches.  Psychiatric/Behavioral: The patient is nervous/anxious.   All other systems reviewed and are negative.    Physical Exam Updated Vital Signs BP 160/78   Pulse 64   Temp 98 F (36.7 C) (Oral)   Resp 13   Ht 5\' 8"  (1.727 m)   Wt 70.8 kg   SpO2 100%   BMI 23.72 kg/m   Physical Exam  Constitutional: He appears well-developed and well-nourished. No distress.  HENT:  Head: Normocephalic and atraumatic.  Right Ear: External ear normal.  Left Ear: External ear normal.  Eyes: Conjunctivae are normal. Right eye exhibits no discharge. Left eye exhibits no discharge. No scleral icterus.  Neck: Neck supple. No tracheal deviation present.    Cardiovascular: Normal rate, regular rhythm and intact distal pulses.   Pulmonary/Chest: Effort normal and breath sounds normal. No stridor. No respiratory distress. He has no wheezes. He has no rales.  Abdominal: Soft. Normal appearance and bowel sounds are normal. He exhibits no distension. There is no splenomegaly or hepatomegaly. There is tenderness in the epigastric area. There is no rigidity, no rebound, no guarding and no CVA tenderness.  Musculoskeletal: He exhibits no edema or tenderness.  Neurological: He is alert. He has normal strength. No cranial nerve deficit (no facial droop, extraocular movements intact, no slurred speech) or sensory deficit. He exhibits normal muscle tone. He displays no seizure activity. Coordination normal.  Skin: Skin is warm and dry. No rash noted.  Psychiatric: He has a normal mood and affect.  Nursing note and vitals reviewed.    ED Treatments / Results  Labs (all labs ordered are listed, but only abnormal results are displayed) Labs Reviewed  BASIC METABOLIC PANEL - Abnormal; Notable for the following:       Result Value   Glucose, Bld 123 (*)    All other components within normal limits  CBC  TROPONIN I  HEPATIC FUNCTION PANEL  LIPASE, BLOOD    EKG  EKG Interpretation  Date/Time:  Tuesday September 02 2016 08:39:48 EDT Ventricular Rate:  70 PR Interval:    QRS Duration: 103 QT Interval:  402 QTC Calculation: 434 R Axis:   83 Text Interpretation:  Sinus rhythm Confirmed by [facility] MD, PEDRO ([id number]) on 09/02/2016 8:44:17 AM       Radiology Dg Chest 2 View  Result Date: 09/02/2016 CLINICAL DATA:  Epigastric chest pain with shortness of Breath EXAM: CHEST  2 VIEW COMPARISON:  12/10/2015 FINDINGS: Cardiac shadow is within normal limits. The lungs are well aerated bilaterally. No focal infiltrate or sizable effusion is seen. Spinal stimulator is again noted and stable. The visualized upper abdomen is within normal limits. No bony  abnormality is seen. IMPRESSION: No acute abnormality noted. Electronically Signed   By: Inez Catalina M.D.   On: 09/02/2016 09:09    Procedures Procedures (including critical care time)  Medications Ordered in ED Medications - No data to display   Initial Impression / Assessment and Plan / ED Course  I have reviewed the triage vital signs and the nursing notes.  Pertinent labs & imaging results that were available during my care of the patient were reviewed by me and considered in my medical decision making (see chart for details).  Clinical Course    *I have reviewed nursing notes, vital signs, and all appropriate lab and imaging results for this patient.**  Final Clinical Impressions(s) / ED Diagnoses  Trop neg. EKG non-acute. Liver functions mildly elevated.  Lipase negative. 2nd troponin  negative for acute event.  Lab results discussed with patient. Pt to see GI for additional evaluation and management. Rx for zantac and protonix given to the patient.   Final diagnoses:  Other chest pain    New Prescriptions New Prescriptions   No medications on file     Lily Kocher, PA-C 09/02/16 [place], MD 09/04/16 1108

## 2016-09-02 NOTE — ED Triage Notes (Signed)
Pt reports pain in epigastric area/central chest radiating to back since last night. Has had sob, diaphoresis, n/v.  Pt alert and oriented at this time.

## 2016-09-11 ENCOUNTER — Telehealth: Payer: Self-pay

## 2016-09-11 ENCOUNTER — Encounter: Payer: Self-pay | Admitting: Gastroenterology

## 2016-09-11 ENCOUNTER — Ambulatory Visit (INDEPENDENT_AMBULATORY_CARE_PROVIDER_SITE_OTHER): Payer: PPO | Admitting: Gastroenterology

## 2016-09-11 ENCOUNTER — Other Ambulatory Visit: Payer: Self-pay

## 2016-09-11 VITALS — BP 135/85 | HR 70 | Temp 97.5°F | Ht 68.0 in | Wt 155.6 lb

## 2016-09-11 DIAGNOSIS — R945 Abnormal results of liver function studies: Principal | ICD-10-CM

## 2016-09-11 DIAGNOSIS — R7989 Other specified abnormal findings of blood chemistry: Secondary | ICD-10-CM

## 2016-09-11 LAB — HEPATIC FUNCTION PANEL
ALBUMIN: 4.2 g/dL (ref 3.6–5.1)
ALT: 57 U/L — AB (ref 9–46)
AST: 15 U/L (ref 10–35)
Alkaline Phosphatase: 138 U/L — ABNORMAL HIGH (ref 40–115)
BILIRUBIN INDIRECT: 0.4 mg/dL (ref 0.2–1.2)
Bilirubin, Direct: 0.1 mg/dL (ref <=0.2)
TOTAL PROTEIN: 6.3 g/dL (ref 6.1–8.1)
Total Bilirubin: 0.5 mg/dL (ref 0.2–1.2)

## 2016-09-11 NOTE — Telephone Encounter (Signed)
Scheduled CT abd/pelvis with contrast for tomorrow 09/12/16 at 5:00pm at [facility]. Pt to pick-up contrast. NPO 4 hrs prior to test. Called and informed pt's friend Aaron Edelman).

## 2016-09-11 NOTE — Patient Instructions (Signed)
We have scheduled you for a CT of your abdomen for further evaluation.   Please have blood work done.   I have started you on a constipation medication called Linzess 145 mcg capsules. Take 1 capsule each morning on an empty stomach, 30 minutes before breakfast. If you like the samples, let me know so I can send in a prescription.

## 2016-09-11 NOTE — Progress Notes (Signed)
Primary Care Physician:  Philis Fendt, MD Primary Gastroenterologist:  Dr. Oneida Alar   Chief Complaint  Patient presents with  . Abdominal Pain    went up into chest, went to ER, vomited    HPI:   Jim Martin is a 60 y.o. male presenting today at the request of the ED secondary to abdominal pain.   Mid abdominal pain started aching, radiating up into chest, severe. Acute onset. No problems since that time. Unable to do any MRI as he has a spinal cord stimulator. Had similar episode about a year ago in Jan. This episode three times worse. Associated N/V, sweats, chills. Losing weight since early this year, unintentionally for the past year. About 30 lbs. Denies diarrhea. Notes constipation. Will eat a pack of Nabs till supper and then that's it. Doesn't have much of an appetite. Denies ETOH use. Denies drug use. Denies any former drug use. Gallbladder present. Last colonoscopy about 5-6 years ago in [place]. Unknown who did this. States he didn't have any polyps. No FH of colon cancer. No prior EGD.   BM about once a week.   Past Medical History:  Diagnosis Date  . Anxiety   . 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 Chronic low back pain   . Hypertension     Past Surgical History:  Procedure Laterality Date  . lumbosacral spine surgery    . SPINAL CORD STIMULATOR IMPLANT      Current Outpatient Prescriptions  Medication Sig Dispense Refill  . amLODipine (NORVASC) 10 MG tablet Take 10 mg by mouth daily.    Marland Kitchen aspirin EC 81 MG tablet Take 81 mg by mouth daily.    . methocarbamol (ROBAXIN) 750 MG tablet Take 750 mg by mouth 3 (three) times daily.    Marland Kitchen morphine (MS CONTIN) 30 MG 12 hr tablet Take 30 mg by mouth 2 (two) times daily.    . nabumetone (RELAFEN) 750 MG tablet Take 750 mg by mouth 2 (two) times daily.    Marland Kitchen oxyCODONE (ROXICODONE) 15 MG immediate release tablet Take 15 mg by mouth 5 (five) times daily.   0  . pantoprazole (PROTONIX) 20 MG tablet Take 1 tablet (20 mg  total) by mouth daily. 15 tablet 0  . ranitidine (ZANTAC) 150 MG tablet Take 1 tablet (150 mg total) by mouth 2 (two) times daily. 60 tablet 0  . topiramate (TOPAMAX) 100 MG tablet Take 1 tablet (100 mg total) by mouth at bedtime. 90 tablet 2  . zolpidem (AMBIEN) 10 MG tablet Take 10 mg by mouth at bedtime as needed for sleep.     No current facility-administered medications for this visit.     Allergies as of 09/11/2016  . (No Known Allergies)    Family History  Problem Relation Age of Onset  . Cancer Mother   . Heart attack Father   . Heart attack Brother   . Colon cancer Neg Hx     Social History   Social History  . Marital status: Divorced    Spouse name: N/A  . Number of children: 1  . Years of education: 8th   Occupational History  . disabilty    Social History Main Topics  . Smoking status: Current Every Day Smoker    Packs/day: 1.00  . Smokeless tobacco: Never Used  . Alcohol use No  . Drug use: No  . Sexual activity: Not on file   Other Topics Concern  . Not on file  Social History Narrative   Patient is single with one child.   Patient is left handed.   Patient has an 8 th grade education.   Patient drinks 5 or more sodas daily.    Review of Systems: As mentioned in HPI   Physical Exam: BP 135/85   Pulse 70   Temp 97.5 F (36.4 C) (Oral)   Ht 5\' 8"  (1.727 m)   Wt 155 lb 9.6 oz (70.6 kg)   BMI 23.66 kg/m  General:   Alert and oriented. Pleasant and cooperative. Well-nourished and well-developed.  Head:  Normocephalic and atraumatic. Eyes:  Without icterus, sclera clear and conjunctiva pink.  Ears:  Normal auditory acuity. Nose:  No deformity, discharge,  or lesions. Lungs:  Clear to auscultation bilaterally. No wheezes, rales, or rhonchi. No distress.  Heart:  S1, S2 present without murmurs appreciated.  Abdomen:  +BS, soft, non-tender and non-distended. No HSM noted. No guarding or rebound. No masses appreciated.  Rectal:  Deferred    Msk:  Symmetrical without gross deformities. Normal posture. Extremities:  Without  edema. Neurologic:  Alert and  oriented x4;  grossly normal neurologically. Psych:  Alert and cooperative. Normal mood and affect.  Lab Results  Component Value Date   ALT 197 (H) 09/02/2016   AST 308 (H) 09/02/2016   ALKPHOS 128 (H) 09/02/2016   BILITOT 2.2 (H) 09/02/2016   Lab Results  Component Value Date   LIPASE 15 09/02/2016   Lab Results  Component Value Date   WBC 10.3 09/02/2016   HGB 17.0 09/02/2016   HCT 50.5 09/02/2016   MCV 94.2 09/02/2016   PLT 225 09/02/2016   Lab Results  Component Value Date   CREATININE 1.02 09/02/2016   BUN 14 09/02/2016   NA 136 09/02/2016   K 3.5 09/02/2016   CL 107 09/02/2016   CO2 27 09/02/2016

## 2016-09-12 ENCOUNTER — Ambulatory Visit (HOSPITAL_COMMUNITY)
Admission: RE | Admit: 2016-09-12 | Discharge: 2016-09-12 | Disposition: A | Discharge status: Home / Self Care | Payer: PPO | Source: Ambulatory Visit | Attending: Gastroenterology | Admitting: Gastroenterology

## 2016-09-12 DIAGNOSIS — I2510 Atherosclerotic heart disease of native coronary artery without angina pectoris: Secondary | ICD-10-CM | POA: Diagnosis not present

## 2016-09-12 DIAGNOSIS — I700 Atherosclerosis of aorta: Secondary | ICD-10-CM | POA: Diagnosis not present

## 2016-09-12 DIAGNOSIS — R7989 Other specified abnormal findings of blood chemistry: Secondary | ICD-10-CM | POA: Diagnosis not present

## 2016-09-12 DIAGNOSIS — R945 Abnormal results of liver function studies: Secondary | ICD-10-CM

## 2016-09-12 DIAGNOSIS — R109 Unspecified abdominal pain: Secondary | ICD-10-CM | POA: Diagnosis not present

## 2016-09-12 LAB — HEPATITIS C ANTIBODY: HCV Ab: NEGATIVE

## 2016-09-12 MED ORDER — IOPAMIDOL (ISOVUE-300) INJECTION 61%
100.0000 mL | Freq: Once | INTRAVENOUS | Status: AC | PRN
  Administered 2016-09-12: 100 mL via INTRAVENOUS

## 2016-09-12 NOTE — Progress Notes (Signed)
Reviewed CT with patient. Let's go ahead and refer to surgery. LFTs are also almost normalized. Please refer to Surgery ASAP. I have copied Randall Hiss and Magda Paganini as FYI in my absence.

## 2016-09-13 DIAGNOSIS — R7989 Other specified abnormal findings of blood chemistry: Secondary | ICD-10-CM | POA: Insufficient documentation

## 2016-09-13 DIAGNOSIS — R945 Abnormal results of liver function studies: Principal | ICD-10-CM

## 2016-09-13 NOTE — Assessment & Plan Note (Addendum)
60 year old male presenting to the ED with acute abdominal pain and found to have elevated transaminases, mixed pattern, normal lipase, no imaging completed. Lipase normal. At time of visit, abdominal pain resolved and without further symptoms. Notes weight loss over the past year that is likely multifactorial in setting of decreased oral intake. Gallbladder remains in situ. Unclear etiology at this point. Needs imaging and recheck of LFTs. Will also check Hep C antibody as he falls in the birth cohort but has no other risk factors. If persistent elevated LFTs and no imaging concerns, will need further serologies. Patient asymptomatic at time of visit. Concern for occult etiology remains. Further recommendations to follow.   As separate issue: chronic constipation likely opioid-induced. Start Linzess 145 mcg daily. May need to increase to 290 mcg daily. Patient to call for prescription if this works well. Last colonoscopy approximately 5-6 years ago in [place] per his report; he is unsure who did this but states it was "normal".

## 2016-09-15 ENCOUNTER — Telehealth: Payer: Self-pay

## 2016-09-15 ENCOUNTER — Other Ambulatory Visit: Payer: Self-pay

## 2016-09-15 DIAGNOSIS — R109 Unspecified abdominal pain: Secondary | ICD-10-CM

## 2016-09-15 NOTE — Telephone Encounter (Signed)
[name] (pt's friend/emergency contact) called asking questions about CT scan results. Stated [facility] had called him Friday night about results, but the pt didn't understand where mass is located. Mr. Radford Pax asked that he be contacted so he can explain to the pt. His phone number is [phone]. Informed Mr. Radford Pax that referral has been sent to surgeon this morning and we will call when we have further info about referral.

## 2016-09-15 NOTE — Progress Notes (Signed)
CC'D TO PCP °

## 2016-09-16 NOTE — Telephone Encounter (Signed)
Note in Epic stating it is ok to discuss the patient's medical information with [name]. Reviewed the CT results with Mr. Jim Martin so that he can help explain to the patient. He is aware of the surgical consult.

## 2016-09-18 ENCOUNTER — Telehealth: Payer: Self-pay

## 2016-09-18 NOTE — Telephone Encounter (Signed)
Called pt's friend [name]) and informed of pt's appt with Dr. Rosendo Gros at [facility] 09/30/16 at 11:00am. Address and phone number to office given. Attempted to call pt to inform of appt, but no answer and no answering machine.

## 2016-09-18 NOTE — Telephone Encounter (Signed)
Claiborne Billings from Dr. Arnoldo Morale' office called this morning and stated Dr. Arnoldo Morale does not do adreanal mass. He advised to send pt to Dr. Rosendo Gros at [facility]. Referral info faxed to [facility].

## 2016-09-22 ENCOUNTER — Encounter: Payer: Self-pay | Admitting: Gastroenterology

## 2016-09-22 NOTE — Progress Notes (Signed)
Received procedure notes from 2010 by Dr. Oletta Lamas at [facility]: normal colonoscopy. Internal hemorrhoids noted.  Annitta Needs, ANP-BC [facility]

## 2016-09-23 DIAGNOSIS — Z79891 Long term (current) use of opiate analgesic: Secondary | ICD-10-CM | POA: Diagnosis not present

## 2016-09-23 DIAGNOSIS — M79604 Pain in right leg: Secondary | ICD-10-CM | POA: Diagnosis not present

## 2016-09-23 DIAGNOSIS — G8929 Other chronic pain: Secondary | ICD-10-CM | POA: Diagnosis not present

## 2016-09-23 DIAGNOSIS — M545 Low back pain: Secondary | ICD-10-CM | POA: Diagnosis not present

## 2016-09-23 DIAGNOSIS — M79605 Pain in left leg: Secondary | ICD-10-CM | POA: Diagnosis not present

## 2016-09-23 DIAGNOSIS — M542 Cervicalgia: Secondary | ICD-10-CM | POA: Diagnosis not present

## 2016-09-23 DIAGNOSIS — G894 Chronic pain syndrome: Secondary | ICD-10-CM | POA: Diagnosis not present

## 2016-09-30 DIAGNOSIS — E279 Disorder of adrenal gland, unspecified: Secondary | ICD-10-CM | POA: Diagnosis not present

## 2016-10-07 NOTE — Telephone Encounter (Signed)
Jim Martin to get notes.

## 2016-10-07 NOTE — Telephone Encounter (Signed)
I called [facility] and he had his appt 09/30/16.

## 2016-10-07 NOTE — Telephone Encounter (Signed)
Can we get notes

## 2016-10-07 NOTE — Telephone Encounter (Signed)
Has patient been seen by [facility] yet?

## 2016-10-15 DIAGNOSIS — E279 Disorder of adrenal gland, unspecified: Secondary | ICD-10-CM | POA: Diagnosis not present

## 2016-10-21 ENCOUNTER — Ambulatory Visit (INDEPENDENT_AMBULATORY_CARE_PROVIDER_SITE_OTHER): Payer: PPO | Admitting: Adult Health

## 2016-10-21 ENCOUNTER — Encounter: Payer: Self-pay | Admitting: Adult Health

## 2016-10-21 VITALS — BP 136/87 | HR 73 | Resp 18 | Ht 68.0 in | Wt 162.0 lb

## 2016-10-21 DIAGNOSIS — G43109 Migraine with aura, not intractable, without status migrainosus: Secondary | ICD-10-CM | POA: Diagnosis not present

## 2016-10-21 NOTE — Progress Notes (Signed)
I have read the note, and I agree with the clinical assessment and plan.  Gloria Lambertson KEITH   

## 2016-10-21 NOTE — Progress Notes (Signed)
PATIENT: Jim Martin DOB: 1956-04-14  REASON FOR VISIT: follow up HISTORY FROM: patient  HISTORY OF PRESENT ILLNESS: Jim Martin is a 60 year old male with a history of basilar migraine. He returns today for follow-up. He is currently on Topamax and tolerating it well. He states that his headaches are infrequent. He has not had a headache since the last visit. He can normally trigger a headache with looking up. With his headaches he does have neck discomfort, slurred speech and balance difficulty. The patient also has lower back pain and has a spinal steroid stimulator. He sees a pain specialist for this. He denies any new neurological symptoms. He returns today for an evaluation.  HISTORY 06/20/16: Jim Martin is a 60 year old left-handed white male with a history of basilar migraine. The patient has also had some chronic pain issues with the low back, he is followed through a pain center. He reports that one of the biggest activators for his headaches is at the looks up, he can induce neck discomfort, and then the headache will ensue associated with slurred speech and difficulty with balance. The patient indicates that he has not had any events in greater than 1 month. Some of the events are quite brief lasting only 15-20 minutes, the other events may last several hours. The patient has gained improvement with the Topamax. MRI of the brain in the past has been unremarkable. The patient indicates that he has had an evaluation of the cervical spine at some point, I do not have the results of this. He has a spinal stimulator in for his low back pain, he cannot have MRI at this time. The patient returns for an evaluation.   REVIEW OF SYSTEMS: Out of a complete 14 system review of symptoms, the patient complains only of the following symptoms, and all other reviewed systems are negative.  Headache, numbness, weakness, joint pain, back pain, aching muscles, walking difficulty, neck pain, self injury,  bruise/bleed easily, shortness of breath, restless leg, loss of vision  ALLERGIES: No Known Allergies  HOME MEDICATIONS: Outpatient Medications Prior to Visit  Medication Sig Dispense Refill  . amLODipine (NORVASC) 10 MG tablet Take 10 mg by mouth daily.    Marland Kitchen aspirin EC 81 MG tablet Take 81 mg by mouth daily.    . methocarbamol (ROBAXIN) 750 MG tablet Take 750 mg by mouth 3 (three) times daily.    Marland Kitchen morphine (MS CONTIN) 30 MG 12 hr tablet Take 30 mg by mouth 2 (two) times daily.    . nabumetone (RELAFEN) 750 MG tablet Take 750 mg by mouth 2 (two) times daily.    Marland Kitchen oxyCODONE (ROXICODONE) 15 MG immediate release tablet Take 15 mg by mouth 5 (five) times daily.   0  . pantoprazole (PROTONIX) 20 MG tablet Take 1 tablet (20 mg total) by mouth daily. 15 tablet 0  . ranitidine (ZANTAC) 150 MG tablet Take 1 tablet (150 mg total) by mouth 2 (two) times daily. 60 tablet 0  . topiramate (TOPAMAX) 100 MG tablet Take 1 tablet (100 mg total) by mouth at bedtime. 90 tablet 2  . zolpidem (AMBIEN) 10 MG tablet Take 10 mg by mouth at bedtime as needed for sleep.     No facility-administered medications prior to visit.     PAST MEDICAL HISTORY: Past Medical History:  Diagnosis Date  . Anxiety   . 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 Chronic low back pain   . Hypertension     PAST SURGICAL HISTORY:  Past Surgical History:  Procedure Laterality Date  . COLONOSCOPY  11/2009   Dr. Oletta Lamas: medium-sized internal hemorrhoids, normal   . lumbosacral spine surgery    . SPINAL CORD STIMULATOR IMPLANT      FAMILY HISTORY: Family History  Problem Relation Age of Onset  . Cancer Mother   . Heart attack Father   . Heart attack Brother   . Colon cancer Neg Hx     SOCIAL HISTORY: Social History   Social History  . Marital status: Divorced    Spouse name: N/A  . Number of children: 1  . Years of education: 8th   Occupational History  . disabilty    Social History Main Topics  . Smoking status:  Current Every Day Smoker    Packs/day: 1.00  . Smokeless tobacco: Never Used  . Alcohol use No  . Drug use: No  . Sexual activity: Not on file   Other Topics Concern  . Not on file   Social History Narrative   Patient is single with one child.   Patient is left handed.   Patient has an 8 th grade education.   Patient drinks 5 or more sodas daily.      PHYSICAL EXAM  Vitals:   10/21/16 1005  BP: 136/87  Pulse: 73  Resp: 18  Weight: 162 lb (73.5 kg)  Height: 5\' 8"  (1.727 m)   Body mass index is 24.63 kg/m.  Generalized: Well developed, in no acute distress   Neurological examination  Mentation: Alert oriented to time, place, history taking. Follows all commands speech and language fluent Cranial nerve II-XII: Pupils were equal round reactive to light. Extraocular movements were full, visual field were full on confrontational test.Iend beat nystagmus noted. Facial sensation and strength were normal. Uvula tongue midline. Head turning and shoulder shrug  were normal and symmetric. Motor: The motor testing reveals 5 over 5 strength of all 4 extremities. Good symmetric motor tone is noted throughout.  Sensory: Sensory testing is intact to soft touch on all 4 extremities. No evidence of extinction is noted.  Coordination: Cerebellar testing reveals good finger-nose-finger and heel-to-shin bilaterally.  Gait and station: Gait is normal. Tandem gait is unsteady. Romberg is negative. No drift is seen.  Reflexes: Deep tendon reflexes are symmetric and normal bilaterally.   DIAGNOSTIC DATA (LABS, IMAGING, TESTING) - I reviewed patient records, labs, notes, testing and imaging myself where available.  Lab Results  Component Value Date   WBC 10.3 09/02/2016   HGB 17.0 09/02/2016   HCT 50.5 09/02/2016   MCV 94.2 09/02/2016   PLT 225 09/02/2016      Component Value Date/Time   NA 136 09/02/2016 0848   K 3.5 09/02/2016 0848   CL 107 09/02/2016 0848   CO2 27 09/02/2016 0848     GLUCOSE 123 (H) 09/02/2016 0848   BUN 14 09/02/2016 0848   CREATININE 1.02 09/02/2016 0848   CALCIUM 9.1 09/02/2016 0848   PROT 6.3 09/11/2016 1132   ALBUMIN 4.2 09/11/2016 1132   AST 15 09/11/2016 1132   ALT 57 (H) 09/11/2016 1132   ALKPHOS 138 (H) 09/11/2016 1132   BILITOT 0.5 09/11/2016 1132   GFRNONAA >60 09/02/2016 0848   GFRAA >60 09/02/2016 0848   Lab Results  Component Value Date   CHOL 187 12/10/2015   HDL 27 (L) 12/10/2015   LDLCALC 125 (H) 12/10/2015   TRIG 174 (H) 12/10/2015   CHOLHDL 6.9 12/10/2015   Lab Results  Component Value Date  HGBA1C 5.5 12/10/2015      ASSESSMENT AND PLAN 60 y.o. year old male  has a past medical history of Anxiety; Basilar migraine (10/05/2013); Chronic low back pain; and Hypertension. here with:  1. Basilar migraine  Overall patient has remained stable. His headaches are under good control. He will continue on Topamax 100 mg at bedtime. Patient advised that if his symptoms worsen or he develops any new symptoms he should let us know. He will follow-up in 8-9 months.   Ward Givens, MSN, NP-C 10/21/2016, 9:39 AM [facility] [address], [place], [place] [address] [phone]

## 2016-10-21 NOTE — Patient Instructions (Signed)
Continue topamax 100 mg at bedtime If your symptoms worsen or you develop new symptoms please let us know.

## 2016-10-22 DIAGNOSIS — M545 Low back pain: Secondary | ICD-10-CM | POA: Diagnosis not present

## 2016-10-22 DIAGNOSIS — M79604 Pain in right leg: Secondary | ICD-10-CM | POA: Diagnosis not present

## 2016-10-22 DIAGNOSIS — M79622 Pain in left upper arm: Secondary | ICD-10-CM | POA: Diagnosis not present

## 2016-10-22 DIAGNOSIS — M79605 Pain in left leg: Secondary | ICD-10-CM | POA: Diagnosis not present

## 2016-10-22 DIAGNOSIS — M542 Cervicalgia: Secondary | ICD-10-CM | POA: Diagnosis not present

## 2016-10-22 DIAGNOSIS — Z79891 Long term (current) use of opiate analgesic: Secondary | ICD-10-CM | POA: Diagnosis not present

## 2016-10-22 DIAGNOSIS — M25552 Pain in left hip: Secondary | ICD-10-CM | POA: Diagnosis not present

## 2016-10-22 DIAGNOSIS — M25551 Pain in right hip: Secondary | ICD-10-CM | POA: Diagnosis not present

## 2016-10-22 DIAGNOSIS — G894 Chronic pain syndrome: Secondary | ICD-10-CM | POA: Diagnosis not present

## 2016-10-22 NOTE — Telephone Encounter (Signed)
Notes reviewed by Dr. Rosendo Gros. Proceeding with 24-hour urine collection. May ultimately need endocrine referral if it is a pheochromocytoma.   Can we touch base with patient to see where he is in this process?

## 2016-10-23 NOTE — Telephone Encounter (Signed)
Spoke with pt and he did the 24 hour urine about 2 weeks ago.

## 2016-10-23 NOTE — Telephone Encounter (Signed)
Requested OV notes from 09/30/16

## 2016-11-10 ENCOUNTER — Emergency Department (HOSPITAL_COMMUNITY)
Admission: EM | Admit: 2016-11-10 | Discharge: 2016-11-11 | Disposition: A | Discharge status: Home / Self Care | Payer: PPO | Attending: Emergency Medicine | Admitting: Emergency Medicine

## 2016-11-10 ENCOUNTER — Encounter (HOSPITAL_COMMUNITY): Payer: Self-pay | Admitting: Emergency Medicine

## 2016-11-10 ENCOUNTER — Emergency Department (HOSPITAL_COMMUNITY): Payer: PPO

## 2016-11-10 DIAGNOSIS — F17210 Nicotine dependence, cigarettes, uncomplicated: Secondary | ICD-10-CM | POA: Diagnosis not present

## 2016-11-10 DIAGNOSIS — R0602 Shortness of breath: Secondary | ICD-10-CM | POA: Diagnosis not present

## 2016-11-10 DIAGNOSIS — Z7982 Long term (current) use of aspirin: Secondary | ICD-10-CM | POA: Insufficient documentation

## 2016-11-10 DIAGNOSIS — M545 Low back pain, unspecified: Secondary | ICD-10-CM

## 2016-11-10 DIAGNOSIS — Z79899 Other long term (current) drug therapy: Secondary | ICD-10-CM | POA: Diagnosis not present

## 2016-11-10 DIAGNOSIS — R109 Unspecified abdominal pain: Secondary | ICD-10-CM | POA: Diagnosis not present

## 2016-11-10 DIAGNOSIS — I10 Essential (primary) hypertension: Secondary | ICD-10-CM | POA: Diagnosis not present

## 2016-11-10 DIAGNOSIS — R1033 Periumbilical pain: Secondary | ICD-10-CM | POA: Diagnosis not present

## 2016-11-10 DIAGNOSIS — R079 Chest pain, unspecified: Secondary | ICD-10-CM | POA: Diagnosis not present

## 2016-11-10 LAB — HEPATIC FUNCTION PANEL
ALBUMIN: 4.2 g/dL (ref 3.5–5.0)
ALK PHOS: 96 U/L (ref 38–126)
ALT: 42 U/L (ref 17–63)
AST: 75 U/L — ABNORMAL HIGH (ref 15–41)
BILIRUBIN DIRECT: 1 mg/dL — AB (ref 0.1–0.5)
BILIRUBIN TOTAL: 1.9 mg/dL — AB (ref 0.3–1.2)
Indirect Bilirubin: 0.9 mg/dL (ref 0.3–0.9)
Total Protein: 7 g/dL (ref 6.5–8.1)

## 2016-11-10 LAB — BASIC METABOLIC PANEL
Anion gap: 8 (ref 5–15)
BUN: 13 mg/dL (ref 6–20)
CALCIUM: 9 mg/dL (ref 8.9–10.3)
CHLORIDE: 102 mmol/L (ref 101–111)
CO2: 26 mmol/L (ref 22–32)
CREATININE: 0.84 mg/dL (ref 0.61–1.24)
GFR calc Af Amer: >60 mL/min (ref >60)
GFR calc non Af Amer: >60 mL/min (ref >60)
GLUCOSE: 141 mg/dL — AB (ref 65–99)
Potassium: 3.2 mmol/L — ABNORMAL LOW (ref 3.5–5.1)
Sodium: 136 mmol/L (ref 135–145)

## 2016-11-10 LAB — CBC
HCT: 47.1 % (ref 39.0–52.0)
Hemoglobin: 16.2 g/dL (ref 13.0–17.0)
MCH: 32.7 pg (ref 26.0–34.0)
MCHC: 34.4 g/dL (ref 30.0–36.0)
MCV: 95.2 fL (ref 78.0–100.0)
PLATELETS: 240 10*3/uL (ref 150–400)
RBC: 4.95 MIL/uL (ref 4.22–5.81)
RDW: 13.7 % (ref 11.5–15.5)
WBC: 14.9 10*3/uL — ABNORMAL HIGH (ref 4.0–10.5)

## 2016-11-10 LAB — LIPASE, BLOOD: LIPASE: 17 U/L (ref 11–51)

## 2016-11-10 LAB — I-STAT TROPONIN, ED: TROPONIN I, POC: 0 ng/mL (ref 0.00–0.08)

## 2016-11-10 NOTE — ED Triage Notes (Signed)
Pt reports chest pain that started today around 1700. Pt states his back started hurting around 1530 and then started moving into his abdomen and then into his chest. Pt reports SOB earlier but none now. Pt denies n/v.

## 2016-11-10 NOTE — ED Provider Notes (Signed)
[facility] DEPT Provider Note   CSN: [id number] Arrival date & time: 11/10/16  2137 By signing my name below, I, Doran Stabler, attest that this documentation has been prepared under the direction and in the presence of Noemi Chapel, MD. Electronically Signed: Doran Stabler, ED Scribe. 11/10/16. 10:03 PM.  History   Chief Complaint Chief Complaint  Patient presents with  . Chest Pain   The history is provided by the patient. No language interpreter was used.   HPI Comments: Jim Martin is a 60 y.o. male who presents to the Emergency Department with a PMHx of chronic back pain and HTN complaining of a sudden onset of periumbilical abdominal pain that began 5 hours ago. Pt states his pain began after having a normal bowel movement. Afterwards, he began having pain which radiates laterally to his thoracic back and up into his thoracic chest. He states his symptoms are similar to the 2 episode he has had previously. Pt denies any N/V/D, SOB, fevers, chills, or any other symptom at this time.   Past Medical History:  Diagnosis Date  . Anxiety   . 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 Chronic low back pain   . Hypertension     Patient Active Problem List   Diagnosis Date Noted  . Elevated LFTs 09/13/2016  . Chest pain, rule out acute myocardial infarction 12/10/2015  . Hypertension 12/10/2015  . Chronic low back pain 12/10/2015  . Anxiety 12/10/2015  . Chest pain of uncertain etiology [id number]  . Hyperglycemia 12/10/2015  . 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 BRAIN SURGERY    . COLONOSCOPY  11/2009   Dr. Oletta Lamas: medium-sized internal hemorrhoids, normal   . lumbosacral spine surgery    . SPINAL CORD STIMULATOR IMPLANT      Home Medications    Prior to Admission medications   Medication Sig Start Date End Date Taking? Authorizing Provider  amLODipine (NORVASC) 10 MG tablet Take 10 mg by mouth daily. 09/16/13  Yes  Historical Provider, MD  aspirin EC 81 MG tablet Take 81 mg by mouth daily.   Yes Historical Provider, MD  methocarbamol (ROBAXIN) 750 MG tablet Take 750 mg by mouth 3 (three) times daily.   Yes Historical Provider, MD  morphine (MS CONTIN) 30 MG 12 hr tablet Take 30 mg by mouth 2 (two) times daily.   Yes Historical Provider, MD  nabumetone (RELAFEN) 750 MG tablet Take 750 mg by mouth 2 (two) times daily.   Yes Historical Provider, MD  oxyCODONE (ROXICODONE) 15 MG immediate release tablet Take 15 mg by mouth 5 (five) times daily.  02/06/15  Yes Historical Provider, MD  pantoprazole (PROTONIX) 20 MG tablet Take 20 mg by mouth once as needed for heartburn or indigestion.   Yes Historical Provider, MD  topiramate (TOPAMAX) 100 MG tablet Take 1 tablet (100 mg total) by mouth at bedtime. 06/20/16  Yes Kathrynn Ducking, MD  famotidine (PEPCID) 20 MG tablet Take 1 tablet (20 mg total) by mouth 2 (two) times daily. 11/11/16   Noemi Chapel, MD  ranitidine (ZANTAC) 150 MG tablet Take 1 tablet (150 mg total) by mouth 2 (two) times daily. Patient not taking: Reported on 11/10/2016 09/02/16   Lily Kocher, PA-C  zolpidem (AMBIEN) 10 MG tablet Take 10 mg by mouth at bedtime as needed for sleep.    Historical Provider, MD   Family History Family History  Problem Relation Age of Onset  . Cancer Mother   .  Heart attack Father   . Heart attack Brother   . Colon cancer Neg Hx    Social History Social History  Substance Use Topics  . Smoking status: Current Every Day Smoker    Packs/day: 1.50    Types: Cigarettes  . Smokeless tobacco: Never Used  . Alcohol use No   Allergies   Patient has no known allergies.  Review of Systems Review of Systems  Constitutional: Negative for chills and fever.  Respiratory: Negative for shortness of breath.   Gastrointestinal: Positive for abdominal pain. Negative for diarrhea, nausea and vomiting.  All other systems reviewed and are negative. Physical Exam Updated  Vital Signs BP 138/76   Pulse 70   Temp 97.7 F (36.5 C) (Oral)   Resp 10   Ht 5\' 8"  (1.727 m)   Wt 162 lb (73.5 kg)   SpO2 98%   BMI 24.63 kg/m   Physical Exam  Constitutional: He is oriented to person, place, and time. He appears well-developed and well-nourished.  HENT:  Head: Normocephalic and atraumatic.  Eyes: EOM are normal.  Neck: Normal range of motion.  Cardiovascular: Normal rate, regular rhythm and intact distal pulses.   Distant heart sounds.  Pulmonary/Chest: Effort normal and breath sounds normal. No respiratory distress.  Barrel chested.   Abdominal: Soft. He exhibits no distension. There is no tenderness.  Musculoskeletal: Normal range of motion.  Neurological: He is alert and oriented to person, place, and time.  Skin: Skin is warm and dry.  Psychiatric: He has a normal mood and affect. Judgment normal.  Nursing note and vitals reviewed.  ED Treatments / Results  DIAGNOSTIC STUDIES: Oxygen Saturation is 100% on room air, normal by my interpretation.    COORDINATION OF CARE: 10:03 PM Discussed treatment plan with pt at bedside and pt agreed to plan.  Labs (all labs ordered are listed, but only abnormal results are displayed) Labs Reviewed  BASIC METABOLIC PANEL - Abnormal; Notable for the following:       Result Value   Potassium 3.2 (*)    Glucose, Bld 141 (*)    All other components within normal limits  CBC - Abnormal; Notable for the following:    WBC 14.9 (*)    All other components within normal limits  HEPATIC FUNCTION PANEL - Abnormal; Notable for the following:    AST 75 (*)    Total Bilirubin 1.9 (*)    Bilirubin, Direct 1.0 (*)    All other components within normal limits  LIPASE, BLOOD  I-STAT TROPOININ, ED    EKG  EKG Interpretation  Date/Time:  Monday November 10 2016 21:50:52 EST Ventricular Rate:  86 PR Interval:    QRS Duration: 104 QT Interval:  389 QTC Calculation: 466 R Axis:   85 Text Interpretation:  Sinus rhythm  since last tracing no significant change Confirmed by Sabra Heck  MD, Sharlynn Seckinger ([id number]) on 11/10/2016 9:56:11 PM      Radiology Dg Chest 2 View  Result Date: 11/10/2016 CLINICAL DATA:  Chest pain starting today around 1700 hours. Back pain started around 1530 hours, moving into the abdomen and then chest. Some shortness of breath. EXAM: CHEST  2 VIEW COMPARISON:  09/02/2016 FINDINGS: Normal heart size and pulmonary vascularity. No focal airspace disease or consolidation in the lungs. No blunting of costophrenic angles. No pneumothorax. Mediastinal contours appear intact. Stimulator leads in the mid thoracic spinal region. IMPRESSION: No active cardiopulmonary disease. Electronically Signed   By: Oren Beckmann.D.  On: 11/10/2016 22:32    Procedures Procedures (including critical care time)  Medications Ordered in ED Medications  HYDROmorphone (DILAUDID) injection 1 mg (not administered)     Initial Impression / Assessment and Plan / ED Course  I have reviewed the triage vital signs and the nursing notes.  Pertinent labs & imaging results that were available during my care of the patient were reviewed by me and considered in my medical decision making (see chart for details).  Clinical Course     Pain improved spontaneously - went back to his umbilicus and to his lower back where is chronic pain is.  I don't know the exact source of his pain but I doubt that this is cardiac given his normal w/u in the past including the stress earlier this year, the normal cardiac enzymes and non ischemic ECG .  I don't think it is a AAA or disection - has no severe hypertension and has had CT recently showing no AAA.  He has unremarkable labs other than WBC but his LFT's and Cr are unremarkable and improved compared to prior labs.  He has been given pain meds for lower back, possible PUD / Gastritis - is well appearing for d/c.  He was informed of his results and expressed understanding to the indications  for return.  Final Clinical Impressions(s) / ED Diagnoses   Final diagnoses:  Chest pain, unspecified type  Abdominal pain, unspecified abdominal location  Low back pain without sciatica, unspecified back pain laterality, unspecified chronicity    New Prescriptions New Prescriptions   FAMOTIDINE (PEPCID) 20 MG TABLET    Take 1 tablet (20 mg total) by mouth 2 (two) times daily.   I personally performed the services described in this documentation, which was scribed in my presence. The recorded information has been reviewed and is accurate.        Noemi Chapel, MD 11/11/16 [phone]

## 2016-11-11 MED ORDER — HYDROMORPHONE HCL 1 MG/ML IJ SOLN
1.0000 mg | Freq: Once | INTRAMUSCULAR | Status: AC
  Administered 2016-11-11: 1 mg via INTRAVENOUS
  Filled 2016-11-11: qty 1

## 2016-11-11 MED ORDER — FAMOTIDINE 20 MG PO TABS: 20.0000 mg | ORAL_TABLET | Freq: Two times a day (BID) | ORAL | 0 refills | Status: AC

## 2016-11-11 NOTE — Discharge Instructions (Signed)

## 2016-11-19 DIAGNOSIS — G894 Chronic pain syndrome: Secondary | ICD-10-CM | POA: Diagnosis not present

## 2016-11-19 DIAGNOSIS — M25552 Pain in left hip: Secondary | ICD-10-CM | POA: Diagnosis not present

## 2016-11-19 DIAGNOSIS — M545 Low back pain: Secondary | ICD-10-CM | POA: Diagnosis not present

## 2016-11-19 DIAGNOSIS — M542 Cervicalgia: Secondary | ICD-10-CM | POA: Diagnosis not present

## 2016-11-19 DIAGNOSIS — M79622 Pain in left upper arm: Secondary | ICD-10-CM | POA: Diagnosis not present

## 2016-11-19 DIAGNOSIS — M25551 Pain in right hip: Secondary | ICD-10-CM | POA: Diagnosis not present

## 2016-11-19 DIAGNOSIS — M79605 Pain in left leg: Secondary | ICD-10-CM | POA: Diagnosis not present

## 2016-11-19 DIAGNOSIS — Z79891 Long term (current) use of opiate analgesic: Secondary | ICD-10-CM | POA: Diagnosis not present

## 2016-11-19 DIAGNOSIS — M79604 Pain in right leg: Secondary | ICD-10-CM | POA: Diagnosis not present

## 2016-11-21 DIAGNOSIS — K219 Gastro-esophageal reflux disease without esophagitis: Secondary | ICD-10-CM | POA: Diagnosis not present

## 2016-11-21 DIAGNOSIS — Q245 Malformation of coronary vessels: Secondary | ICD-10-CM | POA: Diagnosis not present

## 2016-11-21 DIAGNOSIS — K5900 Constipation, unspecified: Secondary | ICD-10-CM | POA: Diagnosis not present

## 2016-11-21 DIAGNOSIS — I10 Essential (primary) hypertension: Secondary | ICD-10-CM | POA: Diagnosis not present

## 2016-12-16 DIAGNOSIS — M79605 Pain in left leg: Secondary | ICD-10-CM | POA: Diagnosis not present

## 2016-12-16 DIAGNOSIS — M545 Low back pain: Secondary | ICD-10-CM | POA: Diagnosis not present

## 2016-12-16 DIAGNOSIS — G894 Chronic pain syndrome: Secondary | ICD-10-CM | POA: Diagnosis not present

## 2016-12-16 DIAGNOSIS — M79662 Pain in left lower leg: Secondary | ICD-10-CM | POA: Diagnosis not present

## 2016-12-16 DIAGNOSIS — M79661 Pain in right lower leg: Secondary | ICD-10-CM | POA: Diagnosis not present

## 2016-12-16 DIAGNOSIS — M542 Cervicalgia: Secondary | ICD-10-CM | POA: Diagnosis not present

## 2016-12-16 DIAGNOSIS — M79604 Pain in right leg: Secondary | ICD-10-CM | POA: Diagnosis not present

## 2016-12-16 DIAGNOSIS — Z79891 Long term (current) use of opiate analgesic: Secondary | ICD-10-CM | POA: Diagnosis not present

## 2016-12-30 ENCOUNTER — Encounter: Payer: Self-pay | Admitting: Gastroenterology

## 2016-12-30 NOTE — Telephone Encounter (Signed)
Following up on patient's status. Did he ever go back to [facility]?   I think he needs to be set up for a non-urgent appt with [place]. Too many loose ends. Don't want him to be lost to follow-up. The only info I have is from the original visit in Oct 2017. Please have him return to see [place] non-urgently and let's see if there are any other appts from [place] after Oct 2017.

## 2016-12-30 NOTE — Telephone Encounter (Signed)
OV made and letter mailed °

## 2017-01-16 ENCOUNTER — Ambulatory Visit: Payer: Medicare Other | Admitting: Gastroenterology

## 2017-01-23 DIAGNOSIS — M79605 Pain in left leg: Secondary | ICD-10-CM | POA: Diagnosis not present

## 2017-01-23 DIAGNOSIS — G894 Chronic pain syndrome: Secondary | ICD-10-CM | POA: Diagnosis not present

## 2017-01-23 DIAGNOSIS — Z79891 Long term (current) use of opiate analgesic: Secondary | ICD-10-CM | POA: Diagnosis not present

## 2017-01-23 DIAGNOSIS — M545 Low back pain: Secondary | ICD-10-CM | POA: Diagnosis not present

## 2017-01-23 DIAGNOSIS — M542 Cervicalgia: Secondary | ICD-10-CM | POA: Diagnosis not present

## 2017-01-23 DIAGNOSIS — M79604 Pain in right leg: Secondary | ICD-10-CM | POA: Diagnosis not present

## 2017-01-23 DIAGNOSIS — M79662 Pain in left lower leg: Secondary | ICD-10-CM | POA: Diagnosis not present

## 2017-01-23 DIAGNOSIS — M79661 Pain in right lower leg: Secondary | ICD-10-CM | POA: Diagnosis not present

## 2017-01-26 DIAGNOSIS — G894 Chronic pain syndrome: Secondary | ICD-10-CM | POA: Diagnosis not present

## 2017-01-26 DIAGNOSIS — M542 Cervicalgia: Secondary | ICD-10-CM | POA: Diagnosis not present

## 2017-01-26 DIAGNOSIS — M79661 Pain in right lower leg: Secondary | ICD-10-CM | POA: Diagnosis not present

## 2017-01-26 DIAGNOSIS — M79604 Pain in right leg: Secondary | ICD-10-CM | POA: Diagnosis not present

## 2017-01-26 DIAGNOSIS — M545 Low back pain: Secondary | ICD-10-CM | POA: Diagnosis not present

## 2017-01-26 DIAGNOSIS — M79605 Pain in left leg: Secondary | ICD-10-CM | POA: Diagnosis not present

## 2017-01-26 DIAGNOSIS — Z79891 Long term (current) use of opiate analgesic: Secondary | ICD-10-CM | POA: Diagnosis not present

## 2017-01-26 DIAGNOSIS — M79662 Pain in left lower leg: Secondary | ICD-10-CM | POA: Diagnosis not present

## 2017-02-06 DIAGNOSIS — Z716 Tobacco abuse counseling: Secondary | ICD-10-CM | POA: Diagnosis not present

## 2017-02-06 DIAGNOSIS — Q245 Malformation of coronary vessels: Secondary | ICD-10-CM | POA: Diagnosis not present

## 2017-02-06 DIAGNOSIS — F17210 Nicotine dependence, cigarettes, uncomplicated: Secondary | ICD-10-CM | POA: Diagnosis not present

## 2017-02-06 DIAGNOSIS — K219 Gastro-esophageal reflux disease without esophagitis: Secondary | ICD-10-CM | POA: Diagnosis not present

## 2017-02-06 DIAGNOSIS — G894 Chronic pain syndrome: Secondary | ICD-10-CM | POA: Diagnosis not present

## 2017-02-06 DIAGNOSIS — I10 Essential (primary) hypertension: Secondary | ICD-10-CM | POA: Diagnosis not present

## 2017-02-20 DIAGNOSIS — G894 Chronic pain syndrome: Secondary | ICD-10-CM | POA: Diagnosis not present

## 2017-02-20 DIAGNOSIS — M25552 Pain in left hip: Secondary | ICD-10-CM | POA: Diagnosis not present

## 2017-02-20 DIAGNOSIS — M79622 Pain in left upper arm: Secondary | ICD-10-CM | POA: Diagnosis not present

## 2017-02-20 DIAGNOSIS — Z79891 Long term (current) use of opiate analgesic: Secondary | ICD-10-CM | POA: Diagnosis not present

## 2017-02-20 DIAGNOSIS — G890 Central pain syndrome: Secondary | ICD-10-CM | POA: Diagnosis not present

## 2017-02-20 DIAGNOSIS — M79621 Pain in right upper arm: Secondary | ICD-10-CM | POA: Diagnosis not present

## 2017-02-20 DIAGNOSIS — M5416 Radiculopathy, lumbar region: Secondary | ICD-10-CM | POA: Diagnosis not present

## 2017-03-20 DIAGNOSIS — G894 Chronic pain syndrome: Secondary | ICD-10-CM | POA: Diagnosis not present

## 2017-03-20 DIAGNOSIS — M79661 Pain in right lower leg: Secondary | ICD-10-CM | POA: Diagnosis not present

## 2017-03-20 DIAGNOSIS — M79662 Pain in left lower leg: Secondary | ICD-10-CM | POA: Diagnosis not present

## 2017-03-20 DIAGNOSIS — M542 Cervicalgia: Secondary | ICD-10-CM | POA: Diagnosis not present

## 2017-03-20 DIAGNOSIS — Z79891 Long term (current) use of opiate analgesic: Secondary | ICD-10-CM | POA: Diagnosis not present

## 2017-03-20 DIAGNOSIS — M545 Low back pain: Secondary | ICD-10-CM | POA: Diagnosis not present

## 2017-03-20 DIAGNOSIS — M79604 Pain in right leg: Secondary | ICD-10-CM | POA: Diagnosis not present

## 2017-03-20 DIAGNOSIS — M79605 Pain in left leg: Secondary | ICD-10-CM | POA: Diagnosis not present

## 2017-04-07 ENCOUNTER — Other Ambulatory Visit: Payer: Self-pay | Admitting: Neurology

## 2017-04-17 DIAGNOSIS — M542 Cervicalgia: Secondary | ICD-10-CM | POA: Diagnosis not present

## 2017-04-17 DIAGNOSIS — G894 Chronic pain syndrome: Secondary | ICD-10-CM | POA: Diagnosis not present

## 2017-04-17 DIAGNOSIS — M545 Low back pain: Secondary | ICD-10-CM | POA: Diagnosis not present

## 2017-04-17 DIAGNOSIS — M79661 Pain in right lower leg: Secondary | ICD-10-CM | POA: Diagnosis not present

## 2017-04-17 DIAGNOSIS — M79621 Pain in right upper arm: Secondary | ICD-10-CM | POA: Diagnosis not present

## 2017-04-17 DIAGNOSIS — Z79891 Long term (current) use of opiate analgesic: Secondary | ICD-10-CM | POA: Diagnosis not present

## 2017-04-17 DIAGNOSIS — M79622 Pain in left upper arm: Secondary | ICD-10-CM | POA: Diagnosis not present

## 2017-04-17 DIAGNOSIS — M79662 Pain in left lower leg: Secondary | ICD-10-CM | POA: Diagnosis not present

## 2017-05-15 DIAGNOSIS — G894 Chronic pain syndrome: Secondary | ICD-10-CM | POA: Diagnosis not present

## 2017-05-15 DIAGNOSIS — M545 Low back pain: Secondary | ICD-10-CM | POA: Diagnosis not present

## 2017-05-15 DIAGNOSIS — M79622 Pain in left upper arm: Secondary | ICD-10-CM | POA: Diagnosis not present

## 2017-05-15 DIAGNOSIS — Z79891 Long term (current) use of opiate analgesic: Secondary | ICD-10-CM | POA: Diagnosis not present

## 2017-05-15 DIAGNOSIS — M542 Cervicalgia: Secondary | ICD-10-CM | POA: Diagnosis not present

## 2017-05-15 DIAGNOSIS — M79621 Pain in right upper arm: Secondary | ICD-10-CM | POA: Diagnosis not present

## 2017-05-15 DIAGNOSIS — M79661 Pain in right lower leg: Secondary | ICD-10-CM | POA: Diagnosis not present

## 2017-05-15 DIAGNOSIS — M79662 Pain in left lower leg: Secondary | ICD-10-CM | POA: Diagnosis not present

## 2017-06-10 IMAGING — CT CT ABD-PELV W/ CM
2 of 5 series · 15 of 46 positions shown, 17 images · IV contrast (iopamidol)
Comparison: No priors.

CLINICAL DATA: 59-year-old male with acute onset of mid abdominal
pain radiating into the chest. Nausea, vomiting, sweats and chills.
Unintentional weight loss of 30 pounds this year. Elevated liver
function tests.

EXAM:
CT ABDOMEN AND PELVIS WITH CONTRAST
TECHNIQUE: Multidetector CT imaging of the abdomen and pelvis was performed
using the standard protocol following bolus administration of
intravenous contrast.
CONTRAST:  100mL [id number] IOPAMIDOL ([id number]) INJECTION 61%

[Series 2: axial st · axial · 0.78mm/px · z∈[-650,-246]mm · 12 of 91 slices shown, 14 images]
[im 5/91  soft-tissue]
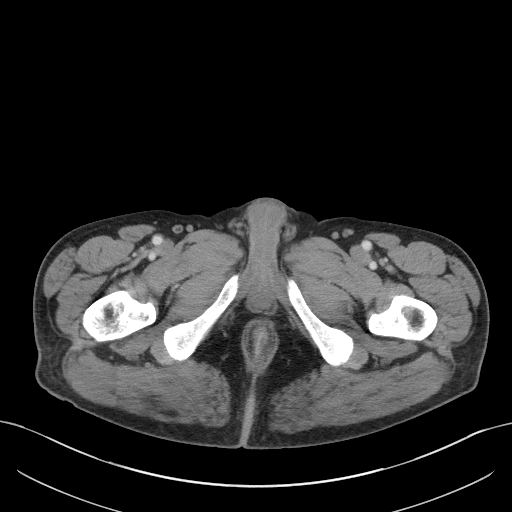
[im 5/91  bone]
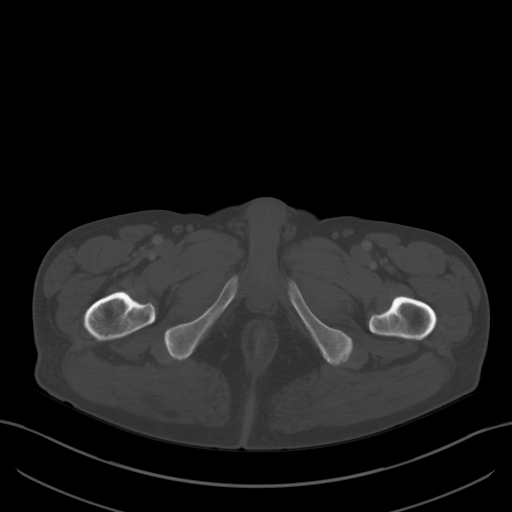
[im 15/91  soft-tissue]
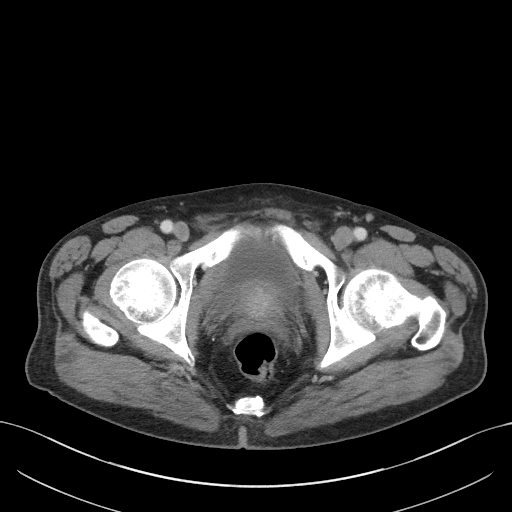
[im 19/91  soft-tissue]
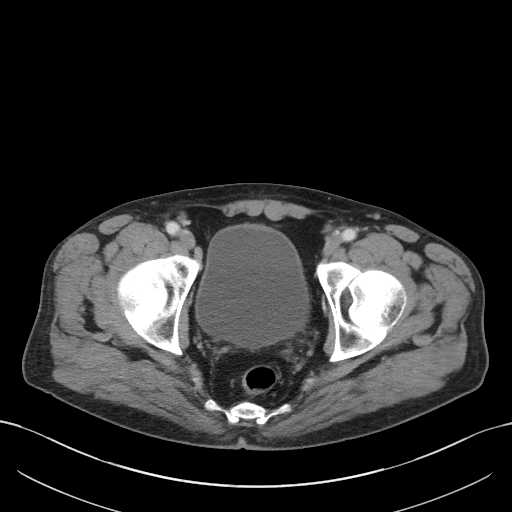
[im 29/91  soft-tissue]
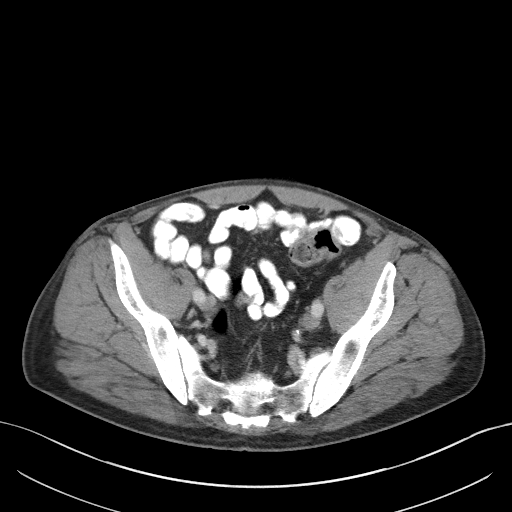
[im 34/91  soft-tissue]
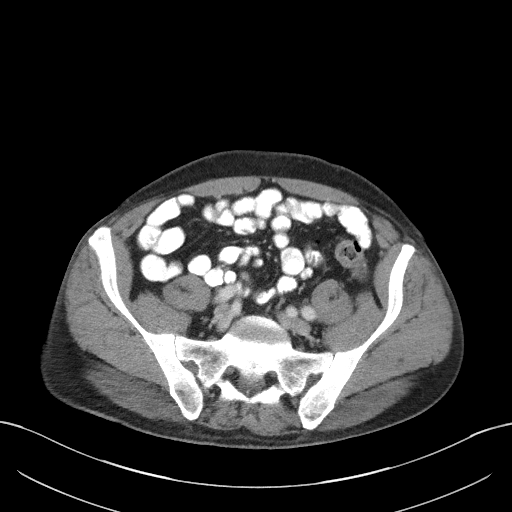
[im 43/91  soft-tissue]
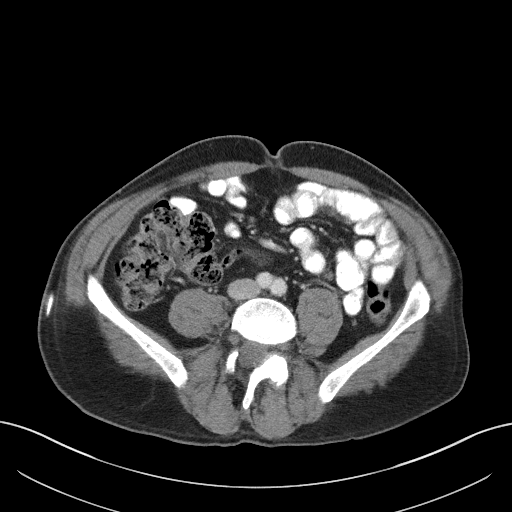
[im 48/91  soft-tissue]
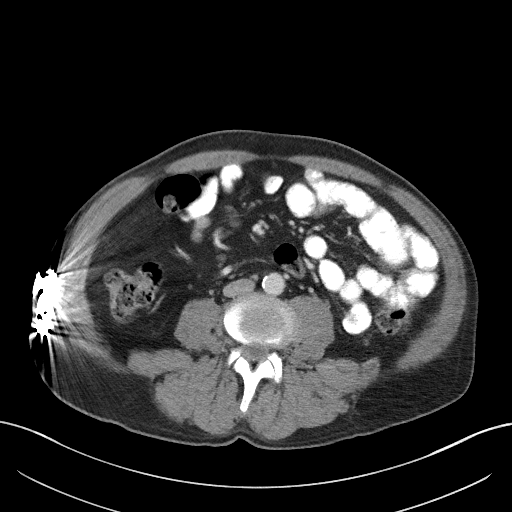
[im 57/91  soft-tissue]
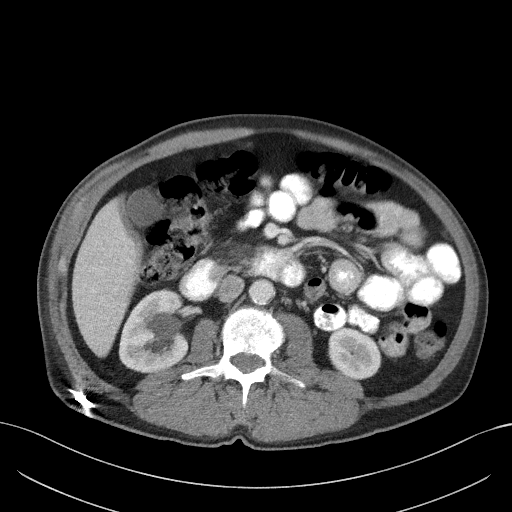
[im 62/91  soft-tissue]
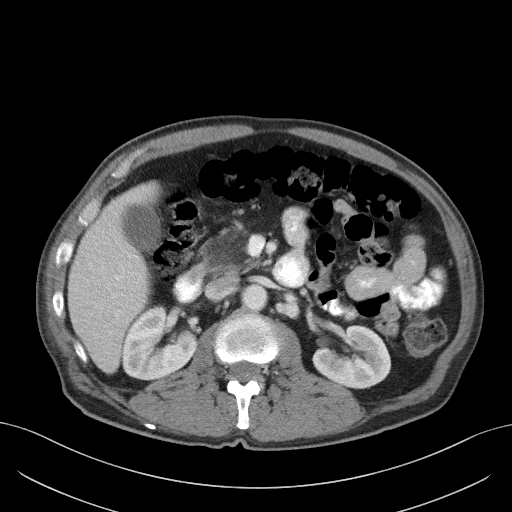
[im 62/91  bone]
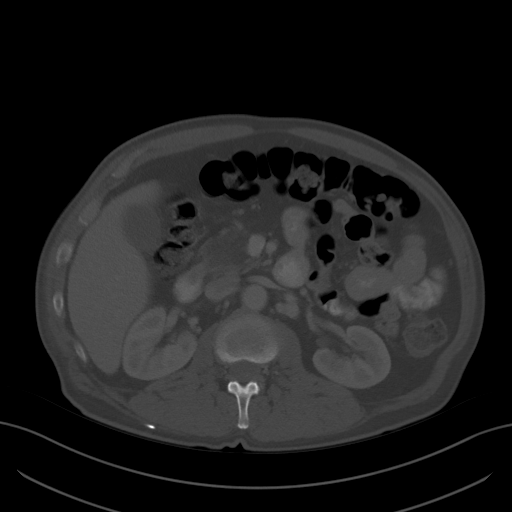
[im 72/91  soft-tissue]
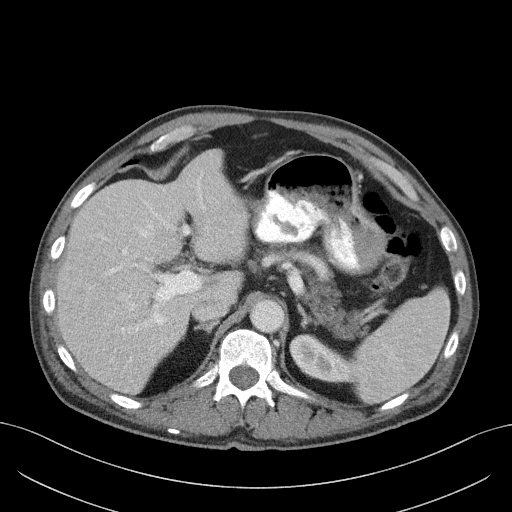
[im 76/91  soft-tissue]
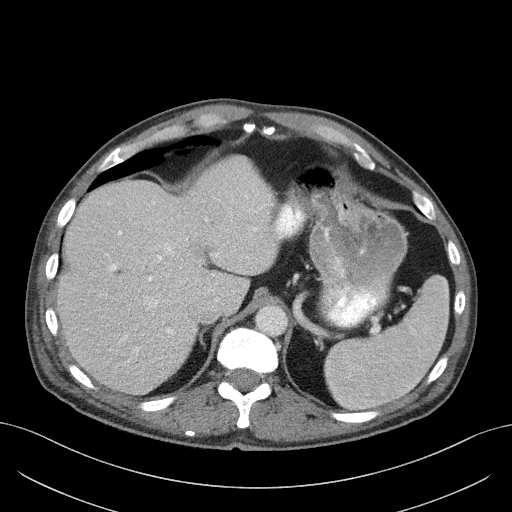
[im 86/91  soft-tissue]
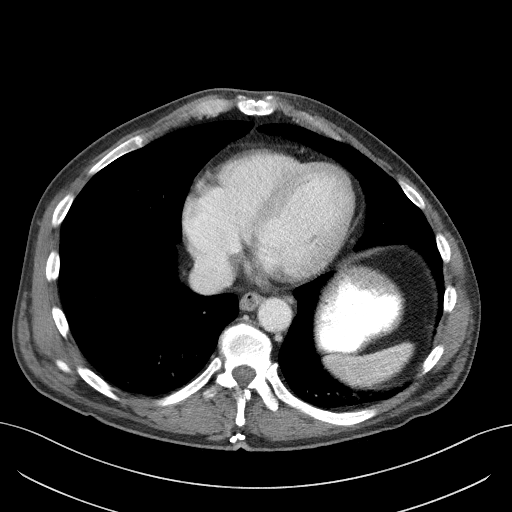

[Series 5: coronal st · coronal · 0.72mm/px · 3 of 92 slices shown]
[im 31/92  soft-tissue]
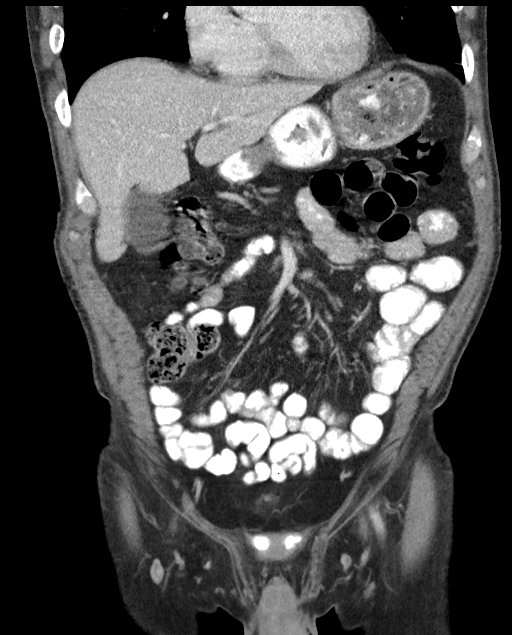
[im 41/92  soft-tissue]
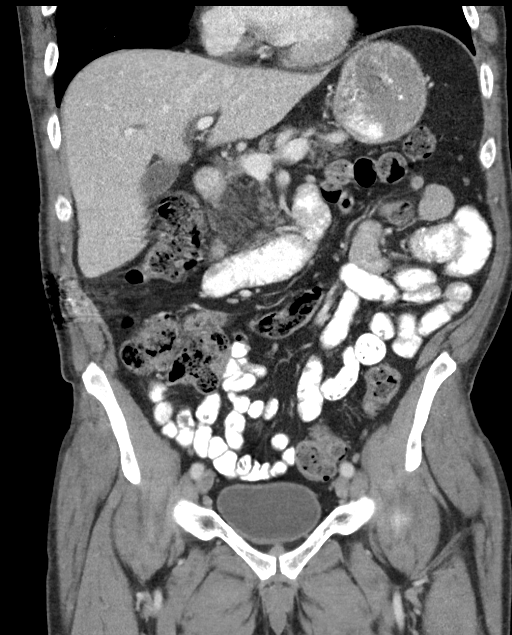
[im 51/92  soft-tissue]
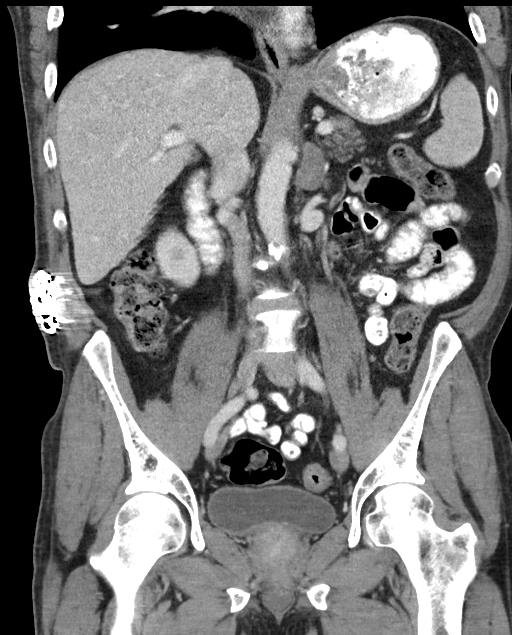

[15 of 46 positions shown; findings below may reference images not displayed]

FINDINGS: Lower chest: Atherosclerotic calcifications in the right coronary
artery.

Hepatobiliary: No cystic or solid hepatic lesions. No intra or
extrahepatic biliary ductal dilatation. Gallbladder is normal in
appearance.

Pancreas: Fatty atrophy in the pancreas, most pronounced in the
pancreatic head anteriorly. No definite pancreatic mass. No
pancreatic ductal dilatation. No pancreatic or peripancreatic fluid
or inflammatory changes.

Spleen: Unremarkable.

Adrenals/Urinary Tract: Immediately adjacent to or arising from the
medial aspect of the left adrenal gland there is a 3.5 x 2.0 x
cm mass which enhances, measuring 52 HU on portal venous phase
imaging increasing to 72 HU on more delayed phase imaging. This mass
appears smoothly marginated, and exerts mass effect upon adjacent
structures, without definite invasive characteristics. The remainder
of the left adrenal gland is otherwise unremarkable in appearance.
Right adrenal gland is normal in appearance. 2 cm low-attenuation
lesion in the interpolar region of the right kidney is compatible
with a parapelvic cysts. Left kidney is normal in appearance. No
hydroureteronephrosis. Urinary bladder is normal in appearance.

Stomach/Bowel: The appearance of the stomach is normal. There is no
pathologic dilatation of small bowel or colon. Normal appendix.

Vascular/Lymphatic: Aortic atherosclerosis, without evidence of
aneurysm or dissection in the abdominal or pelvic vasculature. No
lymphadenopathy noted in the abdomen or pelvis.

Reproductive: Prostate gland and seminal vesicles are unremarkable
in appearance.

Other: No significant volume of ascites.  No pneumoperitoneum.

Musculoskeletal: There are no aggressive appearing lytic or blastic
lesions noted in the visualized portions of the skeleton.
IMPRESSION: 1. No acute findings in the abdomen or pelvis to account for the
patient's symptoms.
2. **An incidental finding of potential clinical significance has
been found. Specifically, there is a left adrenal or para-adrenal
mass in the left para-aortic region of the retroperitoneum which
measures 3.5 x 2.0 x 3.5 cm. The primary diagnostic consideration is
that of a pheochromocytoma or extra-adrenal paraganglioma.
Assessment of plasma metanephrines levels, and 24 hour urinary
collection for assessment of catecholamines and metanephrines is
recommended, in addition to surgical consultation.**
3. Aortic atherosclerosis, in addition to right coronary artery
disease. Please note that although the presence of coronary artery
calcium documents the presence of coronary artery disease, the
severity of this disease and any potential stenosis cannot be
assessed on this non-gated CT examination. Assessment for potential
risk factor modification, dietary therapy or pharmacologic therapy
may be warranted, if clinically indicated.
4. Additional incidental findings, as above.
These results will be called to the ordering clinician or
representative by the Radiologist Assistant, and communication
documented in the PACS or zVision Dashboard.

## 2017-06-22 ENCOUNTER — Ambulatory Visit: Payer: PPO | Admitting: Adult Health

## 2017-06-22 DIAGNOSIS — M79605 Pain in left leg: Secondary | ICD-10-CM | POA: Diagnosis not present

## 2017-06-22 DIAGNOSIS — M79604 Pain in right leg: Secondary | ICD-10-CM | POA: Diagnosis not present

## 2017-06-22 DIAGNOSIS — M542 Cervicalgia: Secondary | ICD-10-CM | POA: Diagnosis not present

## 2017-06-22 DIAGNOSIS — M5416 Radiculopathy, lumbar region: Secondary | ICD-10-CM | POA: Diagnosis not present

## 2017-06-22 DIAGNOSIS — G894 Chronic pain syndrome: Secondary | ICD-10-CM | POA: Diagnosis not present

## 2017-06-22 DIAGNOSIS — M79661 Pain in right lower leg: Secondary | ICD-10-CM | POA: Diagnosis not present

## 2017-06-22 DIAGNOSIS — M79662 Pain in left lower leg: Secondary | ICD-10-CM | POA: Diagnosis not present

## 2017-06-22 DIAGNOSIS — M79621 Pain in right upper arm: Secondary | ICD-10-CM | POA: Diagnosis not present

## 2017-06-23 ENCOUNTER — Encounter: Payer: Self-pay | Admitting: Adult Health

## 2017-07-09 ENCOUNTER — Other Ambulatory Visit: Payer: Self-pay | Admitting: Neurology

## 2017-07-10 ENCOUNTER — Other Ambulatory Visit: Payer: Self-pay | Admitting: *Deleted

## 2017-07-10 MED ORDER — TOPIRAMATE 100 MG PO TABS: 100.0000 mg | ORAL_TABLET | Freq: Every day | ORAL | 0 refills | Status: DC

## 2017-07-17 DIAGNOSIS — M79621 Pain in right upper arm: Secondary | ICD-10-CM | POA: Diagnosis not present

## 2017-07-17 DIAGNOSIS — M25552 Pain in left hip: Secondary | ICD-10-CM | POA: Diagnosis not present

## 2017-07-17 DIAGNOSIS — M5416 Radiculopathy, lumbar region: Secondary | ICD-10-CM | POA: Diagnosis not present

## 2017-07-17 DIAGNOSIS — M79622 Pain in left upper arm: Secondary | ICD-10-CM | POA: Diagnosis not present

## 2017-07-17 DIAGNOSIS — G894 Chronic pain syndrome: Secondary | ICD-10-CM | POA: Diagnosis not present

## 2017-08-18 DIAGNOSIS — M79661 Pain in right lower leg: Secondary | ICD-10-CM | POA: Diagnosis not present

## 2017-08-18 DIAGNOSIS — M542 Cervicalgia: Secondary | ICD-10-CM | POA: Diagnosis not present

## 2017-08-18 DIAGNOSIS — Z79891 Long term (current) use of opiate analgesic: Secondary | ICD-10-CM | POA: Diagnosis not present

## 2017-08-18 DIAGNOSIS — M545 Low back pain: Secondary | ICD-10-CM | POA: Diagnosis not present

## 2017-08-18 DIAGNOSIS — M79622 Pain in left upper arm: Secondary | ICD-10-CM | POA: Diagnosis not present

## 2017-08-18 DIAGNOSIS — M79662 Pain in left lower leg: Secondary | ICD-10-CM | POA: Diagnosis not present

## 2017-08-18 DIAGNOSIS — G894 Chronic pain syndrome: Secondary | ICD-10-CM | POA: Diagnosis not present

## 2017-08-18 DIAGNOSIS — M79621 Pain in right upper arm: Secondary | ICD-10-CM | POA: Diagnosis not present

## 2017-08-25 ENCOUNTER — Encounter: Payer: Self-pay | Admitting: Adult Health

## 2017-08-25 ENCOUNTER — Encounter (INDEPENDENT_AMBULATORY_CARE_PROVIDER_SITE_OTHER): Payer: Self-pay

## 2017-08-25 ENCOUNTER — Ambulatory Visit (INDEPENDENT_AMBULATORY_CARE_PROVIDER_SITE_OTHER): Payer: PPO | Admitting: Adult Health

## 2017-08-25 VITALS — BP 150/83 | HR 84 | Wt 161.2 lb

## 2017-08-25 DIAGNOSIS — G43109 Migraine with aura, not intractable, without status migrainosus: Secondary | ICD-10-CM

## 2017-08-25 NOTE — Progress Notes (Signed)
PATIENT: Jim Martin DOB: [birth year]  REASON FOR VISIT: follow up- basilar migraines HISTORY FROM: patient  HISTORY OF PRESENT ILLNESS: Today 08/25/17 Jim Martin is a 61 year old male with a history of basilar migraines. He returns today for follow-up. He reports that he continues to do very well. He states that he is not had any additional migraines. He reports that his pain specialist took him off of morphine. He states he has had increased neck discomfort since he stopped this medication but denies any significant migraines or headaches. He denies any new neurological symptoms. He returns today for an evaluation.  HISTORY 10/21/16: Jim Martin is a 61 year old male with a history of basilar migraine. He returns today for follow-up. He is currently on Topamax and tolerating it well. He states that his headaches are infrequent. He has not had a headache since the last visit. He can normally trigger a headache with looking up. With his headaches he does have neck discomfort, slurred speech and balance difficulty. The patient also has lower back pain and has a spinal steroid stimulator. He sees a pain specialist for this. He denies any new neurological symptoms. He returns today for an evaluation.  HISTORY 06/20/16: Jim Martin is a 61 year old left-handed white male with a history of basilar migraine. The patient has also had some chronic pain issues with the low back, he is followed through a pain center. He reports that one of the biggest activators for his headaches is at the looks up, he can induce neck discomfort, and then the headache will ensue associated with slurred speech and difficulty with balance. The patient indicates that he has not had any events in greater than 1 month. Some of the events are quite brief lasting only 15-20 minutes, the other events may last several hours. The patient has gained improvement with the Topamax. MRI of the brain in the past has been unremarkable. The patient  indicates that he has had an evaluation of the cervical spine at some point, I do not have the results of this. He has a spinal stimulator in for his low back pain, he cannot have MRI at this time. The patient returns for an evaluation.  REVIEW OF SYSTEMS: Out of a complete 14 system review of symptoms, the patient complains only of the following symptoms, and all other reviewed systems are negative.  Loss of vision, joint pain, back pain, aching muscles, neck pain, neck stiffness, self injury, weakness, numbness, headache, bruise/bleed easily  ALLERGIES: No Known Allergies  HOME MEDICATIONS: Outpatient Medications Prior to Visit  Medication Sig Dispense Refill  . amLODipine (NORVASC) 10 MG tablet Take 10 mg by mouth daily.    Jim Martin aspirin EC 81 MG tablet Take 81 mg by mouth daily.    . famotidine (PEPCID) 20 MG tablet Take 1 tablet (20 mg total) by mouth 2 (two) times daily. 30 tablet 0  . methocarbamol (ROBAXIN) 750 MG tablet Take 750 mg by mouth 3 (three) times daily.    . nabumetone (RELAFEN) 750 MG tablet Take 750 mg by mouth 2 (two) times daily.    Jim Martin oxyCODONE (ROXICODONE) 15 MG immediate release tablet Take 15 mg by mouth 5 (five) times daily.   0  . pantoprazole (PROTONIX) 20 MG tablet Take 20 mg by mouth once as needed for heartburn or indigestion.    . ranitidine (ZANTAC) 150 MG tablet Take 1 tablet (150 mg total) by mouth 2 (two) times daily. (Patient not taking: Reported on 11/10/2016) 60  tablet 0  . topiramate (TOPAMAX) 100 MG tablet Take 1 tablet (100 mg total) by mouth at bedtime. 90 tablet 0  . zolpidem (AMBIEN) 10 MG tablet Take 10 mg by mouth at bedtime as needed for sleep.    Jim Martin morphine (MS CONTIN) 30 MG 12 hr tablet Take 30 mg by mouth 2 (two) times daily.     No facility-administered medications prior to visit.     PAST MEDICAL HISTORY: Past Medical History:  Diagnosis Date  . Anxiety   . 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 Chronic low back pain   . Hypertension      PAST SURGICAL HISTORY: Past Surgical History:  Procedure Laterality Date  . BRAIN SURGERY    . COLONOSCOPY  11/2009   Dr. Oletta Lamas: medium-sized internal hemorrhoids, normal   . lumbosacral spine surgery    . SPINAL CORD STIMULATOR IMPLANT      FAMILY HISTORY: Family History  Problem Relation Age of Onset  . Cancer Mother   . Heart attack Father   . Heart attack Brother   . Colon cancer Neg Hx     SOCIAL HISTORY: Social History   Social History  . Marital status: Divorced    Spouse name: N/A  . Number of children: 1  . Years of education: 8th   Occupational History  . disabilty    Social History Main Topics  . Smoking status: Current Every Day Smoker    Packs/day: 1.50    Types: Cigarettes  . Smokeless tobacco: Never Used  . Alcohol use No  . Drug use: No  . Sexual activity: Not on file   Other Topics Concern  . Not on file   Social History Narrative   Patient is single with one child.   Patient is left handed.   Patient has an 8 th grade education.   Patient drinks 5 or more sodas daily.      PHYSICAL EXAM  Vitals:   08/25/17 1030  BP: (!) 150/83  Pulse: 84  Weight: 161 lb 3.2 oz (73.1 kg)   Body mass index is 24.51 kg/m.  Generalized: Well developed, in no acute distress   Neurological examination  Mentation: Alert oriented to time, place, history taking. Follows all commands speech and language fluent Cranial nerve II-XII: Pupils were equal round reactive to light. Extraocular movements were full, visual field were full on confrontational test. Facial sensation and strength were normal. Uvula tongue midline. Head turning and shoulder shrug  were normal and symmetric. Motor: The motor testing reveals 5 over 5 strength of all 4 extremities. Good symmetric motor tone is noted throughout.  Sensory: Sensory testing is intact to soft touch on all 4 extremities. No evidence of extinction is noted.  Coordination: Cerebellar testing reveals good  finger-nose-finger and heel-to-shin bilaterally.  Gait and station: Gait is normal. Tandem gait is Unsteady. Romberg is negative. No drift is seen.  Reflexes: Deep tendon reflexes are symmetric and normal bilaterally.   DIAGNOSTIC DATA (LABS, IMAGING, TESTING) - I reviewed patient records, labs, notes, testing and imaging myself where available.  Lab Results  Component Value Date   WBC 14.9 (H) 11/10/2016   HGB 16.2 11/10/2016   HCT 47.1 11/10/2016   MCV 95.2 11/10/2016   PLT 240 11/10/2016      Component Value Date/Time   NA 136 11/10/2016 2157   K 3.2 (L) 11/10/2016 2157   CL 102 11/10/2016 2157   CO2 26 11/10/2016 2157   GLUCOSE 141 (H) 11/10/2016  2157   BUN 13 11/10/2016 2157   CREATININE 0.84 11/10/2016 2157   CALCIUM 9.0 11/10/2016 2157   PROT 7.0 11/10/2016 2157   ALBUMIN 4.2 11/10/2016 2157   AST 75 (H) 11/10/2016 2157   ALT 42 11/10/2016 2157   ALKPHOS 96 11/10/2016 2157   BILITOT 1.9 (H) 11/10/2016 2157   GFRNONAA >60 11/10/2016 2157   GFRAA >60 11/10/2016 2157   Lab Results  Component Value Date   CHOL 187 12/10/2015   HDL 27 (L) 12/10/2015   LDLCALC 125 (H) 12/10/2015   TRIG 174 (H) 12/10/2015   CHOLHDL 6.9 12/10/2015   Lab Results  Component Value Date   HGBA1C 5.5 12/10/2015     ASSESSMENT AND PLAN 61 y.o. year old male  has a past medical history of Anxiety; Basilar migraine (10/05/2013); Chronic low back pain; and Hypertension. here with:  1. Basilar migraines  Overall the patient is doing well. He will continue on Topamax 100 mg daily. He is advised that if his symptoms worsen or he develops new symptoms he should let us know. He will follow-up in 6 months or sooner if needed.   Ward Givens, MSN, NP-C 08/25/2017, 10:50 AM [facility] [address] [place], [place] [address] [phone]

## 2017-08-25 NOTE — Patient Instructions (Signed)
Your Plan:  Continue Topamax If your symptoms worsen or you develop new symptoms please let us know.   Thank you for coming to see us at [facility]. I hope we have been able to provide you high quality care today.  You may receive a patient satisfaction survey over the next few weeks. We would appreciate your feedback and comments so that we may continue to improve ourselves and the health of our patients.  

## 2017-08-25 NOTE — Progress Notes (Signed)
I have read the note, and I agree with the clinical assessment and plan.  WILLIS,CHARLES KEITH   

## 2017-09-11 DIAGNOSIS — Z23 Encounter for immunization: Secondary | ICD-10-CM | POA: Diagnosis not present

## 2017-09-11 DIAGNOSIS — F17210 Nicotine dependence, cigarettes, uncomplicated: Secondary | ICD-10-CM | POA: Diagnosis not present

## 2017-09-11 DIAGNOSIS — G894 Chronic pain syndrome: Secondary | ICD-10-CM | POA: Diagnosis not present

## 2017-09-11 DIAGNOSIS — Z125 Encounter for screening for malignant neoplasm of prostate: Secondary | ICD-10-CM | POA: Diagnosis not present

## 2017-09-11 DIAGNOSIS — Z716 Tobacco abuse counseling: Secondary | ICD-10-CM | POA: Diagnosis not present

## 2017-09-11 DIAGNOSIS — Q245 Malformation of coronary vessels: Secondary | ICD-10-CM | POA: Diagnosis not present

## 2017-09-11 DIAGNOSIS — Z131 Encounter for screening for diabetes mellitus: Secondary | ICD-10-CM | POA: Diagnosis not present

## 2017-09-11 DIAGNOSIS — I10 Essential (primary) hypertension: Secondary | ICD-10-CM | POA: Diagnosis not present

## 2017-09-15 ENCOUNTER — Ambulatory Visit: Payer: PPO | Admitting: Adult Health

## 2017-09-15 DIAGNOSIS — M79621 Pain in right upper arm: Secondary | ICD-10-CM | POA: Diagnosis not present

## 2017-09-15 DIAGNOSIS — M79622 Pain in left upper arm: Secondary | ICD-10-CM | POA: Diagnosis not present

## 2017-09-15 DIAGNOSIS — G894 Chronic pain syndrome: Secondary | ICD-10-CM | POA: Diagnosis not present

## 2017-09-15 DIAGNOSIS — M25552 Pain in left hip: Secondary | ICD-10-CM | POA: Diagnosis not present

## 2017-09-15 DIAGNOSIS — M5416 Radiculopathy, lumbar region: Secondary | ICD-10-CM | POA: Diagnosis not present

## 2017-10-12 ENCOUNTER — Other Ambulatory Visit: Payer: Self-pay | Admitting: Adult Health

## 2017-10-13 DIAGNOSIS — G894 Chronic pain syndrome: Secondary | ICD-10-CM | POA: Diagnosis not present

## 2017-11-10 DIAGNOSIS — M79622 Pain in left upper arm: Secondary | ICD-10-CM | POA: Diagnosis not present

## 2017-11-10 DIAGNOSIS — Z79891 Long term (current) use of opiate analgesic: Secondary | ICD-10-CM | POA: Diagnosis not present

## 2017-11-10 DIAGNOSIS — G894 Chronic pain syndrome: Secondary | ICD-10-CM | POA: Diagnosis not present

## 2017-11-10 DIAGNOSIS — M545 Low back pain: Secondary | ICD-10-CM | POA: Diagnosis not present

## 2017-11-10 DIAGNOSIS — M542 Cervicalgia: Secondary | ICD-10-CM | POA: Diagnosis not present

## 2017-11-10 DIAGNOSIS — M79621 Pain in right upper arm: Secondary | ICD-10-CM | POA: Diagnosis not present

## 2017-11-10 DIAGNOSIS — F1120 Opioid dependence, uncomplicated: Secondary | ICD-10-CM | POA: Diagnosis not present

## 2017-11-10 DIAGNOSIS — M79662 Pain in left lower leg: Secondary | ICD-10-CM | POA: Diagnosis not present

## 2017-11-10 DIAGNOSIS — G8929 Other chronic pain: Secondary | ICD-10-CM | POA: Diagnosis not present

## 2017-11-10 DIAGNOSIS — M79661 Pain in right lower leg: Secondary | ICD-10-CM | POA: Diagnosis not present

## 2017-12-17 DIAGNOSIS — G8929 Other chronic pain: Secondary | ICD-10-CM | POA: Diagnosis not present

## 2017-12-17 DIAGNOSIS — G894 Chronic pain syndrome: Secondary | ICD-10-CM | POA: Diagnosis not present

## 2017-12-17 DIAGNOSIS — F1120 Opioid dependence, uncomplicated: Secondary | ICD-10-CM | POA: Diagnosis not present

## 2017-12-17 DIAGNOSIS — G890 Central pain syndrome: Secondary | ICD-10-CM | POA: Diagnosis not present

## 2017-12-17 DIAGNOSIS — M5412 Radiculopathy, cervical region: Secondary | ICD-10-CM | POA: Diagnosis not present

## 2017-12-17 DIAGNOSIS — M5432 Sciatica, left side: Secondary | ICD-10-CM | POA: Diagnosis not present

## 2017-12-17 DIAGNOSIS — M5431 Sciatica, right side: Secondary | ICD-10-CM | POA: Diagnosis not present

## 2017-12-17 DIAGNOSIS — Z79891 Long term (current) use of opiate analgesic: Secondary | ICD-10-CM | POA: Diagnosis not present

## 2017-12-17 DIAGNOSIS — M542 Cervicalgia: Secondary | ICD-10-CM | POA: Diagnosis not present

## 2018-01-15 DIAGNOSIS — G894 Chronic pain syndrome: Secondary | ICD-10-CM | POA: Diagnosis not present

## 2018-01-15 DIAGNOSIS — Z79891 Long term (current) use of opiate analgesic: Secondary | ICD-10-CM | POA: Diagnosis not present

## 2018-01-15 DIAGNOSIS — M79621 Pain in right upper arm: Secondary | ICD-10-CM | POA: Diagnosis not present

## 2018-01-15 DIAGNOSIS — M545 Low back pain: Secondary | ICD-10-CM | POA: Diagnosis not present

## 2018-01-15 DIAGNOSIS — F1120 Opioid dependence, uncomplicated: Secondary | ICD-10-CM | POA: Diagnosis not present

## 2018-01-15 DIAGNOSIS — M79661 Pain in right lower leg: Secondary | ICD-10-CM | POA: Diagnosis not present

## 2018-01-15 DIAGNOSIS — M79662 Pain in left lower leg: Secondary | ICD-10-CM | POA: Diagnosis not present

## 2018-01-15 DIAGNOSIS — G8929 Other chronic pain: Secondary | ICD-10-CM | POA: Diagnosis not present

## 2018-01-15 DIAGNOSIS — M79622 Pain in left upper arm: Secondary | ICD-10-CM | POA: Diagnosis not present

## 2018-01-15 DIAGNOSIS — M542 Cervicalgia: Secondary | ICD-10-CM | POA: Diagnosis not present

## 2018-02-15 DIAGNOSIS — M545 Low back pain: Secondary | ICD-10-CM | POA: Diagnosis not present

## 2018-02-15 DIAGNOSIS — M79662 Pain in left lower leg: Secondary | ICD-10-CM | POA: Diagnosis not present

## 2018-02-15 DIAGNOSIS — G894 Chronic pain syndrome: Secondary | ICD-10-CM | POA: Diagnosis not present

## 2018-02-15 DIAGNOSIS — M79661 Pain in right lower leg: Secondary | ICD-10-CM | POA: Diagnosis not present

## 2018-02-15 DIAGNOSIS — M79621 Pain in right upper arm: Secondary | ICD-10-CM | POA: Diagnosis not present

## 2018-02-15 DIAGNOSIS — F1120 Opioid dependence, uncomplicated: Secondary | ICD-10-CM | POA: Diagnosis not present

## 2018-02-15 DIAGNOSIS — M542 Cervicalgia: Secondary | ICD-10-CM | POA: Diagnosis not present

## 2018-02-15 DIAGNOSIS — Z79891 Long term (current) use of opiate analgesic: Secondary | ICD-10-CM | POA: Diagnosis not present

## 2018-02-15 DIAGNOSIS — M79622 Pain in left upper arm: Secondary | ICD-10-CM | POA: Diagnosis not present

## 2018-02-15 DIAGNOSIS — G8929 Other chronic pain: Secondary | ICD-10-CM | POA: Diagnosis not present

## 2018-03-12 DIAGNOSIS — Q245 Malformation of coronary vessels: Secondary | ICD-10-CM | POA: Diagnosis not present

## 2018-03-12 DIAGNOSIS — Z716 Tobacco abuse counseling: Secondary | ICD-10-CM | POA: Diagnosis not present

## 2018-03-12 DIAGNOSIS — M5126 Other intervertebral disc displacement, lumbar region: Secondary | ICD-10-CM | POA: Diagnosis not present

## 2018-03-12 DIAGNOSIS — F17210 Nicotine dependence, cigarettes, uncomplicated: Secondary | ICD-10-CM | POA: Diagnosis not present

## 2018-03-12 DIAGNOSIS — I10 Essential (primary) hypertension: Secondary | ICD-10-CM | POA: Diagnosis not present

## 2018-03-12 DIAGNOSIS — F419 Anxiety disorder, unspecified: Secondary | ICD-10-CM | POA: Diagnosis not present

## 2018-03-17 DIAGNOSIS — M79605 Pain in left leg: Secondary | ICD-10-CM | POA: Diagnosis not present

## 2018-03-17 DIAGNOSIS — G8929 Other chronic pain: Secondary | ICD-10-CM | POA: Diagnosis not present

## 2018-03-17 DIAGNOSIS — M79604 Pain in right leg: Secondary | ICD-10-CM | POA: Diagnosis not present

## 2018-03-17 DIAGNOSIS — M79621 Pain in right upper arm: Secondary | ICD-10-CM | POA: Diagnosis not present

## 2018-03-17 DIAGNOSIS — Z79891 Long term (current) use of opiate analgesic: Secondary | ICD-10-CM | POA: Diagnosis not present

## 2018-03-17 DIAGNOSIS — M79622 Pain in left upper arm: Secondary | ICD-10-CM | POA: Diagnosis not present

## 2018-03-17 DIAGNOSIS — M79661 Pain in right lower leg: Secondary | ICD-10-CM | POA: Diagnosis not present

## 2018-03-17 DIAGNOSIS — F1120 Opioid dependence, uncomplicated: Secondary | ICD-10-CM | POA: Diagnosis not present

## 2018-03-17 DIAGNOSIS — G894 Chronic pain syndrome: Secondary | ICD-10-CM | POA: Diagnosis not present

## 2018-03-17 DIAGNOSIS — M79662 Pain in left lower leg: Secondary | ICD-10-CM | POA: Diagnosis not present

## 2018-03-17 DIAGNOSIS — M545 Low back pain: Secondary | ICD-10-CM | POA: Diagnosis not present

## 2018-04-16 DIAGNOSIS — F1120 Opioid dependence, uncomplicated: Secondary | ICD-10-CM | POA: Diagnosis not present

## 2018-04-16 DIAGNOSIS — M79605 Pain in left leg: Secondary | ICD-10-CM | POA: Diagnosis not present

## 2018-04-16 DIAGNOSIS — M545 Low back pain: Secondary | ICD-10-CM | POA: Diagnosis not present

## 2018-04-16 DIAGNOSIS — M79621 Pain in right upper arm: Secondary | ICD-10-CM | POA: Diagnosis not present

## 2018-04-16 DIAGNOSIS — M79662 Pain in left lower leg: Secondary | ICD-10-CM | POA: Diagnosis not present

## 2018-04-16 DIAGNOSIS — Z79891 Long term (current) use of opiate analgesic: Secondary | ICD-10-CM | POA: Diagnosis not present

## 2018-04-16 DIAGNOSIS — M79622 Pain in left upper arm: Secondary | ICD-10-CM | POA: Diagnosis not present

## 2018-04-16 DIAGNOSIS — M79661 Pain in right lower leg: Secondary | ICD-10-CM | POA: Diagnosis not present

## 2018-04-16 DIAGNOSIS — G894 Chronic pain syndrome: Secondary | ICD-10-CM | POA: Diagnosis not present

## 2018-04-16 DIAGNOSIS — M79604 Pain in right leg: Secondary | ICD-10-CM | POA: Diagnosis not present

## 2018-04-16 DIAGNOSIS — G8929 Other chronic pain: Secondary | ICD-10-CM | POA: Diagnosis not present

## 2018-05-14 DIAGNOSIS — G894 Chronic pain syndrome: Secondary | ICD-10-CM | POA: Diagnosis not present

## 2018-05-14 DIAGNOSIS — M545 Low back pain: Secondary | ICD-10-CM | POA: Diagnosis not present

## 2018-05-14 DIAGNOSIS — Z79891 Long term (current) use of opiate analgesic: Secondary | ICD-10-CM | POA: Diagnosis not present

## 2018-05-14 DIAGNOSIS — M79661 Pain in right lower leg: Secondary | ICD-10-CM | POA: Diagnosis not present

## 2018-05-14 DIAGNOSIS — M79621 Pain in right upper arm: Secondary | ICD-10-CM | POA: Diagnosis not present

## 2018-05-14 DIAGNOSIS — M79622 Pain in left upper arm: Secondary | ICD-10-CM | POA: Diagnosis not present

## 2018-05-14 DIAGNOSIS — F1120 Opioid dependence, uncomplicated: Secondary | ICD-10-CM | POA: Diagnosis not present

## 2018-05-14 DIAGNOSIS — G8929 Other chronic pain: Secondary | ICD-10-CM | POA: Diagnosis not present

## 2018-05-14 DIAGNOSIS — M79662 Pain in left lower leg: Secondary | ICD-10-CM | POA: Diagnosis not present

## 2018-06-10 DIAGNOSIS — F1120 Opioid dependence, uncomplicated: Secondary | ICD-10-CM | POA: Diagnosis not present

## 2018-06-10 DIAGNOSIS — M79622 Pain in left upper arm: Secondary | ICD-10-CM | POA: Diagnosis not present

## 2018-06-10 DIAGNOSIS — Z79891 Long term (current) use of opiate analgesic: Secondary | ICD-10-CM | POA: Diagnosis not present

## 2018-06-10 DIAGNOSIS — M79662 Pain in left lower leg: Secondary | ICD-10-CM | POA: Diagnosis not present

## 2018-06-10 DIAGNOSIS — M79661 Pain in right lower leg: Secondary | ICD-10-CM | POA: Diagnosis not present

## 2018-06-10 DIAGNOSIS — G894 Chronic pain syndrome: Secondary | ICD-10-CM | POA: Diagnosis not present

## 2018-06-10 DIAGNOSIS — M545 Low back pain: Secondary | ICD-10-CM | POA: Diagnosis not present

## 2018-06-10 DIAGNOSIS — M79604 Pain in right leg: Secondary | ICD-10-CM | POA: Diagnosis not present

## 2018-06-10 DIAGNOSIS — G8929 Other chronic pain: Secondary | ICD-10-CM | POA: Diagnosis not present

## 2018-07-08 DIAGNOSIS — Z79891 Long term (current) use of opiate analgesic: Secondary | ICD-10-CM | POA: Diagnosis not present

## 2018-07-08 DIAGNOSIS — M79662 Pain in left lower leg: Secondary | ICD-10-CM | POA: Diagnosis not present

## 2018-07-08 DIAGNOSIS — F1120 Opioid dependence, uncomplicated: Secondary | ICD-10-CM | POA: Diagnosis not present

## 2018-07-08 DIAGNOSIS — G894 Chronic pain syndrome: Secondary | ICD-10-CM | POA: Diagnosis not present

## 2018-07-08 DIAGNOSIS — M79604 Pain in right leg: Secondary | ICD-10-CM | POA: Diagnosis not present

## 2018-07-08 DIAGNOSIS — G8929 Other chronic pain: Secondary | ICD-10-CM | POA: Diagnosis not present

## 2018-07-08 DIAGNOSIS — M79621 Pain in right upper arm: Secondary | ICD-10-CM | POA: Diagnosis not present

## 2018-07-08 DIAGNOSIS — M79605 Pain in left leg: Secondary | ICD-10-CM | POA: Diagnosis not present

## 2018-07-08 DIAGNOSIS — M545 Low back pain: Secondary | ICD-10-CM | POA: Diagnosis not present

## 2018-07-08 DIAGNOSIS — M79622 Pain in left upper arm: Secondary | ICD-10-CM | POA: Diagnosis not present

## 2018-07-08 DIAGNOSIS — M79661 Pain in right lower leg: Secondary | ICD-10-CM | POA: Diagnosis not present

## 2018-08-06 DIAGNOSIS — Z79891 Long term (current) use of opiate analgesic: Secondary | ICD-10-CM | POA: Diagnosis not present

## 2018-08-06 DIAGNOSIS — M79605 Pain in left leg: Secondary | ICD-10-CM | POA: Diagnosis not present

## 2018-08-06 DIAGNOSIS — F1120 Opioid dependence, uncomplicated: Secondary | ICD-10-CM | POA: Diagnosis not present

## 2018-08-06 DIAGNOSIS — M79621 Pain in right upper arm: Secondary | ICD-10-CM | POA: Diagnosis not present

## 2018-08-06 DIAGNOSIS — M79661 Pain in right lower leg: Secondary | ICD-10-CM | POA: Diagnosis not present

## 2018-08-06 DIAGNOSIS — M79604 Pain in right leg: Secondary | ICD-10-CM | POA: Diagnosis not present

## 2018-08-06 DIAGNOSIS — M545 Low back pain: Secondary | ICD-10-CM | POA: Diagnosis not present

## 2018-08-06 DIAGNOSIS — G8929 Other chronic pain: Secondary | ICD-10-CM | POA: Diagnosis not present

## 2018-08-06 DIAGNOSIS — G894 Chronic pain syndrome: Secondary | ICD-10-CM | POA: Diagnosis not present

## 2018-08-06 DIAGNOSIS — M79622 Pain in left upper arm: Secondary | ICD-10-CM | POA: Diagnosis not present

## 2018-08-26 ENCOUNTER — Ambulatory Visit (INDEPENDENT_AMBULATORY_CARE_PROVIDER_SITE_OTHER): Payer: PPO | Admitting: Neurology

## 2018-08-26 ENCOUNTER — Encounter: Payer: Self-pay | Admitting: Neurology

## 2018-08-26 VITALS — BP 139/65 | HR 68 | Ht 68.0 in | Wt 161.0 lb

## 2018-08-26 DIAGNOSIS — G43109 Migraine with aura, not intractable, without status migrainosus: Secondary | ICD-10-CM | POA: Diagnosis not present

## 2018-08-26 MED ORDER — TOPIRAMATE 100 MG PO TABS: 100.0000 mg | ORAL_TABLET | Freq: Every day | ORAL | 3 refills | Status: DC

## 2018-08-26 NOTE — Progress Notes (Signed)
Reason for visit: Basilar migraine  Jim Martin is an 62 y.o. male  History of present illness:  Mr. Jim Martin is a 62 year old left-handed white male with a history of chronic neck and low back pain, he also has basilar migraine.  He has done well on Topamax 100 mg at night.  He may have one minor headache every 2 to 3 months, he indicates that looking up will induce headaches sometimes.  The patient overall is quite pleased with his headache control.  He returns the office today for an evaluation.  Past Medical History:  Diagnosis Date  . Anxiety   . 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 Chronic low back pain   . Hypertension     Past Surgical History:  Procedure Laterality Date  . BRAIN SURGERY    . COLONOSCOPY  11/2009   Dr. Oletta Lamas: medium-sized internal hemorrhoids, normal   . lumbosacral spine surgery    . SPINAL CORD STIMULATOR IMPLANT      Family History  Problem Relation Age of Onset  . Cancer Mother   . Heart attack Father   . Heart attack Brother   . Colon cancer Neg Hx     Social history:  reports that he has been smoking cigarettes. He has been smoking about 1.50 packs per day. He has never used smokeless tobacco. He reports that he does not drink alcohol or use drugs.   No Known Allergies  Medications:  Prior to Admission medications   Medication Sig Start Date End Date Taking? Authorizing Provider  amLODipine (NORVASC) 10 MG tablet Take 10 mg by mouth daily. 09/16/13  Yes [provider]  aspirin EC 81 MG tablet Take 81 mg by mouth daily.   Yes [provider]  famotidine (PEPCID) 20 MG tablet Take 1 tablet (20 mg total) by mouth 2 (two) times daily. 11/11/16  Yes Noemi Chapel, MD  methocarbamol (ROBAXIN) 750 MG tablet Take 750 mg by mouth 3 (three) times daily.   Yes [provider]  nabumetone (RELAFEN) 750 MG tablet Take 750 mg by mouth 2 (two) times daily.   Yes [provider]  oxyCODONE (ROXICODONE) 15 MG immediate  release tablet Take 15 mg by mouth 5 (five) times daily.  02/06/15  Yes [provider]  pantoprazole (PROTONIX) 20 MG tablet Take 20 mg by mouth once as needed for heartburn or indigestion.   Yes [provider]  ranitidine (ZANTAC) 150 MG tablet Take 1 tablet (150 mg total) by mouth 2 (two) times daily. 09/02/16  Yes Lily Kocher, PA-C  topiramate (TOPAMAX) 100 MG tablet Take 1 tablet (100 mg total) by mouth at bedtime. 08/26/18  Yes Kathrynn Ducking, MD  zolpidem (AMBIEN) 10 MG tablet Take 10 mg by mouth at bedtime as needed for sleep.   Yes [provider]    ROS:  Out of a complete 14 system review of symptoms, the patient complains only of the following symptoms, and all other reviewed systems are negative.  Joint pain, joint swelling, back pain, aching muscles, walking difficulty, neck pain, neck stiffness Restless legs, frequent waking Decreased vision Headache, numbness, weakness  Blood pressure 139/65, pulse 68, height 5\' 8"  (1.727 m), weight 161 lb (73 kg).  Physical Exam  General: The patient is alert and cooperative at the time of the examination.  Skin: No significant peripheral edema is noted.   Neurologic Exam  Mental status: The patient is alert and oriented x 3 at the time of  the examination. The patient has apparent normal recent and remote memory, with an apparently normal attention span and concentration ability.   Cranial nerves: Facial symmetry is present. Speech may be slightly dysarthric, not aphasic. Extraocular movements are full.  The patient has prominent and gaze nystagmus bilaterally.  Visual fields are full.  Motor: The patient has good strength in all 4 extremities.  Sensory examination: Soft touch sensation is symmetric on the face, arms, and legs.  Coordination: The patient has good finger-nose-finger and heel-to-shin bilaterally.  Gait and station: The patient has a normal gait. Tandem gait is unsteady. Romberg is  negative. No drift is seen.  Reflexes: Deep tendon reflexes are symmetric.   Assessment/Plan:  1.  Basilar migraine  The patient is doing quite well at this time on Topamax, we will not alter the dose, he will continue on the medication and follow-up in 1 year.  A prescription was sent in.  [name] 08/26/2018 1:30 PM  [facility] [address] [place], [place] [address]  Phone [phone] Fax [phone]

## 2018-09-08 DIAGNOSIS — M79662 Pain in left lower leg: Secondary | ICD-10-CM | POA: Diagnosis not present

## 2018-09-08 DIAGNOSIS — G894 Chronic pain syndrome: Secondary | ICD-10-CM | POA: Diagnosis not present

## 2018-09-08 DIAGNOSIS — M79661 Pain in right lower leg: Secondary | ICD-10-CM | POA: Diagnosis not present

## 2018-09-08 DIAGNOSIS — G8929 Other chronic pain: Secondary | ICD-10-CM | POA: Diagnosis not present

## 2018-09-08 DIAGNOSIS — M79621 Pain in right upper arm: Secondary | ICD-10-CM | POA: Diagnosis not present

## 2018-09-08 DIAGNOSIS — M79604 Pain in right leg: Secondary | ICD-10-CM | POA: Diagnosis not present

## 2018-09-08 DIAGNOSIS — M79622 Pain in left upper arm: Secondary | ICD-10-CM | POA: Diagnosis not present

## 2018-09-08 DIAGNOSIS — M545 Low back pain: Secondary | ICD-10-CM | POA: Diagnosis not present

## 2018-09-08 DIAGNOSIS — F1120 Opioid dependence, uncomplicated: Secondary | ICD-10-CM | POA: Diagnosis not present

## 2018-09-08 DIAGNOSIS — Z79891 Long term (current) use of opiate analgesic: Secondary | ICD-10-CM | POA: Diagnosis not present

## 2018-09-08 DIAGNOSIS — M79605 Pain in left leg: Secondary | ICD-10-CM | POA: Diagnosis not present

## 2018-10-06 DIAGNOSIS — M79622 Pain in left upper arm: Secondary | ICD-10-CM | POA: Diagnosis not present

## 2018-10-06 DIAGNOSIS — M79604 Pain in right leg: Secondary | ICD-10-CM | POA: Diagnosis not present

## 2018-10-06 DIAGNOSIS — M79621 Pain in right upper arm: Secondary | ICD-10-CM | POA: Diagnosis not present

## 2018-10-06 DIAGNOSIS — G894 Chronic pain syndrome: Secondary | ICD-10-CM | POA: Diagnosis not present

## 2018-10-06 DIAGNOSIS — Z79891 Long term (current) use of opiate analgesic: Secondary | ICD-10-CM | POA: Diagnosis not present

## 2018-10-06 DIAGNOSIS — G8929 Other chronic pain: Secondary | ICD-10-CM | POA: Diagnosis not present

## 2018-10-06 DIAGNOSIS — M79661 Pain in right lower leg: Secondary | ICD-10-CM | POA: Diagnosis not present

## 2018-10-06 DIAGNOSIS — M545 Low back pain: Secondary | ICD-10-CM | POA: Diagnosis not present

## 2018-10-06 DIAGNOSIS — F1120 Opioid dependence, uncomplicated: Secondary | ICD-10-CM | POA: Diagnosis not present

## 2018-10-06 DIAGNOSIS — M79662 Pain in left lower leg: Secondary | ICD-10-CM | POA: Diagnosis not present

## 2018-10-06 DIAGNOSIS — M79605 Pain in left leg: Secondary | ICD-10-CM | POA: Diagnosis not present

## 2018-11-03 DIAGNOSIS — G8929 Other chronic pain: Secondary | ICD-10-CM | POA: Diagnosis not present

## 2018-11-03 DIAGNOSIS — G894 Chronic pain syndrome: Secondary | ICD-10-CM | POA: Diagnosis not present

## 2018-11-03 DIAGNOSIS — M545 Low back pain: Secondary | ICD-10-CM | POA: Diagnosis not present

## 2018-11-03 DIAGNOSIS — M79621 Pain in right upper arm: Secondary | ICD-10-CM | POA: Diagnosis not present

## 2018-11-03 DIAGNOSIS — M79605 Pain in left leg: Secondary | ICD-10-CM | POA: Diagnosis not present

## 2018-11-03 DIAGNOSIS — M79662 Pain in left lower leg: Secondary | ICD-10-CM | POA: Diagnosis not present

## 2018-11-03 DIAGNOSIS — M79604 Pain in right leg: Secondary | ICD-10-CM | POA: Diagnosis not present

## 2018-11-03 DIAGNOSIS — M79622 Pain in left upper arm: Secondary | ICD-10-CM | POA: Diagnosis not present

## 2018-11-03 DIAGNOSIS — F1120 Opioid dependence, uncomplicated: Secondary | ICD-10-CM | POA: Diagnosis not present

## 2018-11-03 DIAGNOSIS — M79661 Pain in right lower leg: Secondary | ICD-10-CM | POA: Diagnosis not present

## 2018-11-03 DIAGNOSIS — Z79891 Long term (current) use of opiate analgesic: Secondary | ICD-10-CM | POA: Diagnosis not present

## 2018-11-03 DIAGNOSIS — M542 Cervicalgia: Secondary | ICD-10-CM | POA: Diagnosis not present

## 2018-11-09 DIAGNOSIS — G603 Idiopathic progressive neuropathy: Secondary | ICD-10-CM | POA: Diagnosis not present

## 2018-11-09 DIAGNOSIS — M542 Cervicalgia: Secondary | ICD-10-CM | POA: Diagnosis not present

## 2018-12-01 DIAGNOSIS — G894 Chronic pain syndrome: Secondary | ICD-10-CM | POA: Diagnosis not present

## 2018-12-01 DIAGNOSIS — M79604 Pain in right leg: Secondary | ICD-10-CM | POA: Diagnosis not present

## 2018-12-01 DIAGNOSIS — Z79891 Long term (current) use of opiate analgesic: Secondary | ICD-10-CM | POA: Diagnosis not present

## 2018-12-01 DIAGNOSIS — M79605 Pain in left leg: Secondary | ICD-10-CM | POA: Diagnosis not present

## 2018-12-01 DIAGNOSIS — M79621 Pain in right upper arm: Secondary | ICD-10-CM | POA: Diagnosis not present

## 2018-12-01 DIAGNOSIS — G8929 Other chronic pain: Secondary | ICD-10-CM | POA: Diagnosis not present

## 2018-12-01 DIAGNOSIS — M545 Low back pain: Secondary | ICD-10-CM | POA: Diagnosis not present

## 2018-12-01 DIAGNOSIS — M79661 Pain in right lower leg: Secondary | ICD-10-CM | POA: Diagnosis not present

## 2018-12-01 DIAGNOSIS — M542 Cervicalgia: Secondary | ICD-10-CM | POA: Diagnosis not present

## 2018-12-01 DIAGNOSIS — F1120 Opioid dependence, uncomplicated: Secondary | ICD-10-CM | POA: Diagnosis not present

## 2018-12-01 DIAGNOSIS — M79662 Pain in left lower leg: Secondary | ICD-10-CM | POA: Diagnosis not present

## 2018-12-01 DIAGNOSIS — M79622 Pain in left upper arm: Secondary | ICD-10-CM | POA: Diagnosis not present

## 2018-12-29 DIAGNOSIS — G8929 Other chronic pain: Secondary | ICD-10-CM | POA: Diagnosis not present

## 2018-12-29 DIAGNOSIS — F1120 Opioid dependence, uncomplicated: Secondary | ICD-10-CM | POA: Diagnosis not present

## 2018-12-29 DIAGNOSIS — M79605 Pain in left leg: Secondary | ICD-10-CM | POA: Diagnosis not present

## 2018-12-29 DIAGNOSIS — Z79891 Long term (current) use of opiate analgesic: Secondary | ICD-10-CM | POA: Diagnosis not present

## 2018-12-29 DIAGNOSIS — M79661 Pain in right lower leg: Secondary | ICD-10-CM | POA: Diagnosis not present

## 2018-12-29 DIAGNOSIS — M79622 Pain in left upper arm: Secondary | ICD-10-CM | POA: Diagnosis not present

## 2018-12-29 DIAGNOSIS — M542 Cervicalgia: Secondary | ICD-10-CM | POA: Diagnosis not present

## 2018-12-29 DIAGNOSIS — M545 Low back pain: Secondary | ICD-10-CM | POA: Diagnosis not present

## 2018-12-29 DIAGNOSIS — M79621 Pain in right upper arm: Secondary | ICD-10-CM | POA: Diagnosis not present

## 2018-12-29 DIAGNOSIS — M79662 Pain in left lower leg: Secondary | ICD-10-CM | POA: Diagnosis not present

## 2018-12-29 DIAGNOSIS — M79604 Pain in right leg: Secondary | ICD-10-CM | POA: Diagnosis not present

## 2018-12-29 DIAGNOSIS — G894 Chronic pain syndrome: Secondary | ICD-10-CM | POA: Diagnosis not present

## 2019-01-11 DIAGNOSIS — Z79891 Long term (current) use of opiate analgesic: Secondary | ICD-10-CM | POA: Diagnosis not present

## 2019-01-11 DIAGNOSIS — G8929 Other chronic pain: Secondary | ICD-10-CM | POA: Diagnosis not present

## 2019-01-11 DIAGNOSIS — M545 Low back pain: Secondary | ICD-10-CM | POA: Diagnosis not present

## 2019-01-11 DIAGNOSIS — M542 Cervicalgia: Secondary | ICD-10-CM | POA: Diagnosis not present

## 2019-01-11 DIAGNOSIS — M79604 Pain in right leg: Secondary | ICD-10-CM | POA: Diagnosis not present

## 2019-01-11 DIAGNOSIS — G894 Chronic pain syndrome: Secondary | ICD-10-CM | POA: Diagnosis not present

## 2019-01-11 DIAGNOSIS — G629 Polyneuropathy, unspecified: Secondary | ICD-10-CM | POA: Diagnosis not present

## 2019-01-11 DIAGNOSIS — M79622 Pain in left upper arm: Secondary | ICD-10-CM | POA: Diagnosis not present

## 2019-01-11 DIAGNOSIS — F1120 Opioid dependence, uncomplicated: Secondary | ICD-10-CM | POA: Diagnosis not present

## 2019-01-11 DIAGNOSIS — M79621 Pain in right upper arm: Secondary | ICD-10-CM | POA: Diagnosis not present

## 2019-01-11 DIAGNOSIS — M79662 Pain in left lower leg: Secondary | ICD-10-CM | POA: Diagnosis not present

## 2019-01-11 DIAGNOSIS — R7303 Prediabetes: Secondary | ICD-10-CM | POA: Diagnosis not present

## 2019-01-11 DIAGNOSIS — M79661 Pain in right lower leg: Secondary | ICD-10-CM | POA: Diagnosis not present

## 2019-02-11 DIAGNOSIS — M79662 Pain in left lower leg: Secondary | ICD-10-CM | POA: Diagnosis not present

## 2019-02-11 DIAGNOSIS — M79661 Pain in right lower leg: Secondary | ICD-10-CM | POA: Diagnosis not present

## 2019-02-11 DIAGNOSIS — G8929 Other chronic pain: Secondary | ICD-10-CM | POA: Diagnosis not present

## 2019-02-11 DIAGNOSIS — M542 Cervicalgia: Secondary | ICD-10-CM | POA: Diagnosis not present

## 2019-02-11 DIAGNOSIS — M79622 Pain in left upper arm: Secondary | ICD-10-CM | POA: Diagnosis not present

## 2019-02-11 DIAGNOSIS — M79605 Pain in left leg: Secondary | ICD-10-CM | POA: Diagnosis not present

## 2019-02-11 DIAGNOSIS — M545 Low back pain: Secondary | ICD-10-CM | POA: Diagnosis not present

## 2019-02-11 DIAGNOSIS — G629 Polyneuropathy, unspecified: Secondary | ICD-10-CM | POA: Diagnosis not present

## 2019-02-11 DIAGNOSIS — Z79891 Long term (current) use of opiate analgesic: Secondary | ICD-10-CM | POA: Diagnosis not present

## 2019-02-11 DIAGNOSIS — M79604 Pain in right leg: Secondary | ICD-10-CM | POA: Diagnosis not present

## 2019-02-11 DIAGNOSIS — M79621 Pain in right upper arm: Secondary | ICD-10-CM | POA: Diagnosis not present

## 2019-02-11 DIAGNOSIS — Z6826 Body mass index (BMI) 26.0-26.9, adult: Secondary | ICD-10-CM | POA: Diagnosis not present

## 2019-02-11 DIAGNOSIS — F1120 Opioid dependence, uncomplicated: Secondary | ICD-10-CM | POA: Diagnosis not present

## 2019-02-11 DIAGNOSIS — G894 Chronic pain syndrome: Secondary | ICD-10-CM | POA: Diagnosis not present

## 2019-03-10 DIAGNOSIS — G629 Polyneuropathy, unspecified: Secondary | ICD-10-CM | POA: Diagnosis not present

## 2019-03-10 DIAGNOSIS — M79604 Pain in right leg: Secondary | ICD-10-CM | POA: Diagnosis not present

## 2019-03-10 DIAGNOSIS — Z79891 Long term (current) use of opiate analgesic: Secondary | ICD-10-CM | POA: Diagnosis not present

## 2019-03-10 DIAGNOSIS — M79661 Pain in right lower leg: Secondary | ICD-10-CM | POA: Diagnosis not present

## 2019-03-10 DIAGNOSIS — M79621 Pain in right upper arm: Secondary | ICD-10-CM | POA: Diagnosis not present

## 2019-03-10 DIAGNOSIS — M79622 Pain in left upper arm: Secondary | ICD-10-CM | POA: Diagnosis not present

## 2019-03-10 DIAGNOSIS — M542 Cervicalgia: Secondary | ICD-10-CM | POA: Diagnosis not present

## 2019-03-10 DIAGNOSIS — F1120 Opioid dependence, uncomplicated: Secondary | ICD-10-CM | POA: Diagnosis not present

## 2019-03-10 DIAGNOSIS — M79605 Pain in left leg: Secondary | ICD-10-CM | POA: Diagnosis not present

## 2019-03-10 DIAGNOSIS — M545 Low back pain: Secondary | ICD-10-CM | POA: Diagnosis not present

## 2019-03-10 DIAGNOSIS — M79662 Pain in left lower leg: Secondary | ICD-10-CM | POA: Diagnosis not present

## 2019-03-10 DIAGNOSIS — Z6826 Body mass index (BMI) 26.0-26.9, adult: Secondary | ICD-10-CM | POA: Diagnosis not present

## 2019-03-10 DIAGNOSIS — G894 Chronic pain syndrome: Secondary | ICD-10-CM | POA: Diagnosis not present

## 2019-03-10 DIAGNOSIS — G8929 Other chronic pain: Secondary | ICD-10-CM | POA: Diagnosis not present

## 2019-03-28 DIAGNOSIS — Z0000 Encounter for general adult medical examination without abnormal findings: Secondary | ICD-10-CM | POA: Diagnosis not present

## 2019-03-28 DIAGNOSIS — Q245 Malformation of coronary vessels: Secondary | ICD-10-CM | POA: Diagnosis not present

## 2019-03-28 DIAGNOSIS — Z13220 Encounter for screening for lipoid disorders: Secondary | ICD-10-CM | POA: Diagnosis not present

## 2019-03-28 DIAGNOSIS — Z131 Encounter for screening for diabetes mellitus: Secondary | ICD-10-CM | POA: Diagnosis not present

## 2019-03-28 DIAGNOSIS — I10 Essential (primary) hypertension: Secondary | ICD-10-CM | POA: Diagnosis not present

## 2019-03-28 DIAGNOSIS — D649 Anemia, unspecified: Secondary | ICD-10-CM | POA: Diagnosis not present

## 2019-03-28 DIAGNOSIS — F17210 Nicotine dependence, cigarettes, uncomplicated: Secondary | ICD-10-CM | POA: Diagnosis not present

## 2019-03-28 DIAGNOSIS — Z1331 Encounter for screening for depression: Secondary | ICD-10-CM | POA: Diagnosis not present

## 2019-03-28 DIAGNOSIS — Z6829 Body mass index (BMI) 29.0-29.9, adult: Secondary | ICD-10-CM | POA: Diagnosis not present

## 2019-03-28 DIAGNOSIS — R51 Headache: Secondary | ICD-10-CM | POA: Diagnosis not present

## 2019-03-28 DIAGNOSIS — E039 Hypothyroidism, unspecified: Secondary | ICD-10-CM | POA: Diagnosis not present

## 2019-04-07 DIAGNOSIS — Z79891 Long term (current) use of opiate analgesic: Secondary | ICD-10-CM | POA: Diagnosis not present

## 2019-04-07 DIAGNOSIS — M79605 Pain in left leg: Secondary | ICD-10-CM | POA: Diagnosis not present

## 2019-04-07 DIAGNOSIS — M79604 Pain in right leg: Secondary | ICD-10-CM | POA: Diagnosis not present

## 2019-04-07 DIAGNOSIS — G8929 Other chronic pain: Secondary | ICD-10-CM | POA: Diagnosis not present

## 2019-04-07 DIAGNOSIS — M542 Cervicalgia: Secondary | ICD-10-CM | POA: Diagnosis not present

## 2019-04-07 DIAGNOSIS — R7303 Prediabetes: Secondary | ICD-10-CM | POA: Diagnosis not present

## 2019-04-07 DIAGNOSIS — F1120 Opioid dependence, uncomplicated: Secondary | ICD-10-CM | POA: Diagnosis not present

## 2019-04-07 DIAGNOSIS — G894 Chronic pain syndrome: Secondary | ICD-10-CM | POA: Diagnosis not present

## 2019-04-07 DIAGNOSIS — M79621 Pain in right upper arm: Secondary | ICD-10-CM | POA: Diagnosis not present

## 2019-04-07 DIAGNOSIS — M79622 Pain in left upper arm: Secondary | ICD-10-CM | POA: Diagnosis not present

## 2019-04-07 DIAGNOSIS — M79662 Pain in left lower leg: Secondary | ICD-10-CM | POA: Diagnosis not present

## 2019-04-07 DIAGNOSIS — M79661 Pain in right lower leg: Secondary | ICD-10-CM | POA: Diagnosis not present

## 2019-04-07 DIAGNOSIS — M545 Low back pain: Secondary | ICD-10-CM | POA: Diagnosis not present

## 2019-05-05 DIAGNOSIS — G894 Chronic pain syndrome: Secondary | ICD-10-CM | POA: Diagnosis not present

## 2019-05-05 DIAGNOSIS — M79621 Pain in right upper arm: Secondary | ICD-10-CM | POA: Diagnosis not present

## 2019-05-05 DIAGNOSIS — M545 Low back pain: Secondary | ICD-10-CM | POA: Diagnosis not present

## 2019-05-05 DIAGNOSIS — M79661 Pain in right lower leg: Secondary | ICD-10-CM | POA: Diagnosis not present

## 2019-05-05 DIAGNOSIS — G8929 Other chronic pain: Secondary | ICD-10-CM | POA: Diagnosis not present

## 2019-05-05 DIAGNOSIS — R7303 Prediabetes: Secondary | ICD-10-CM | POA: Diagnosis not present

## 2019-05-05 DIAGNOSIS — G629 Polyneuropathy, unspecified: Secondary | ICD-10-CM | POA: Diagnosis not present

## 2019-05-05 DIAGNOSIS — F1120 Opioid dependence, uncomplicated: Secondary | ICD-10-CM | POA: Diagnosis not present

## 2019-05-05 DIAGNOSIS — M79662 Pain in left lower leg: Secondary | ICD-10-CM | POA: Diagnosis not present

## 2019-05-05 DIAGNOSIS — M79622 Pain in left upper arm: Secondary | ICD-10-CM | POA: Diagnosis not present

## 2019-05-05 DIAGNOSIS — M79604 Pain in right leg: Secondary | ICD-10-CM | POA: Diagnosis not present

## 2019-05-05 DIAGNOSIS — Z79891 Long term (current) use of opiate analgesic: Secondary | ICD-10-CM | POA: Diagnosis not present

## 2019-05-05 DIAGNOSIS — M79605 Pain in left leg: Secondary | ICD-10-CM | POA: Diagnosis not present

## 2019-05-05 DIAGNOSIS — Z6826 Body mass index (BMI) 26.0-26.9, adult: Secondary | ICD-10-CM | POA: Diagnosis not present

## 2019-05-05 DIAGNOSIS — M542 Cervicalgia: Secondary | ICD-10-CM | POA: Diagnosis not present

## 2019-06-02 DIAGNOSIS — G629 Polyneuropathy, unspecified: Secondary | ICD-10-CM | POA: Diagnosis not present

## 2019-06-02 DIAGNOSIS — M545 Low back pain: Secondary | ICD-10-CM | POA: Diagnosis not present

## 2019-06-02 DIAGNOSIS — R7303 Prediabetes: Secondary | ICD-10-CM | POA: Diagnosis not present

## 2019-06-02 DIAGNOSIS — M79605 Pain in left leg: Secondary | ICD-10-CM | POA: Diagnosis not present

## 2019-06-02 DIAGNOSIS — M79622 Pain in left upper arm: Secondary | ICD-10-CM | POA: Diagnosis not present

## 2019-06-02 DIAGNOSIS — Z79891 Long term (current) use of opiate analgesic: Secondary | ICD-10-CM | POA: Diagnosis not present

## 2019-06-02 DIAGNOSIS — Z6826 Body mass index (BMI) 26.0-26.9, adult: Secondary | ICD-10-CM | POA: Diagnosis not present

## 2019-06-02 DIAGNOSIS — M79661 Pain in right lower leg: Secondary | ICD-10-CM | POA: Diagnosis not present

## 2019-06-02 DIAGNOSIS — M542 Cervicalgia: Secondary | ICD-10-CM | POA: Diagnosis not present

## 2019-06-02 DIAGNOSIS — M79621 Pain in right upper arm: Secondary | ICD-10-CM | POA: Diagnosis not present

## 2019-06-02 DIAGNOSIS — F1120 Opioid dependence, uncomplicated: Secondary | ICD-10-CM | POA: Diagnosis not present

## 2019-06-02 DIAGNOSIS — G894 Chronic pain syndrome: Secondary | ICD-10-CM | POA: Diagnosis not present

## 2019-06-02 DIAGNOSIS — M79662 Pain in left lower leg: Secondary | ICD-10-CM | POA: Diagnosis not present

## 2019-06-02 DIAGNOSIS — M79604 Pain in right leg: Secondary | ICD-10-CM | POA: Diagnosis not present

## 2019-06-02 DIAGNOSIS — G8929 Other chronic pain: Secondary | ICD-10-CM | POA: Diagnosis not present

## 2019-07-06 DIAGNOSIS — M79661 Pain in right lower leg: Secondary | ICD-10-CM | POA: Diagnosis not present

## 2019-07-06 DIAGNOSIS — M79622 Pain in left upper arm: Secondary | ICD-10-CM | POA: Diagnosis not present

## 2019-07-06 DIAGNOSIS — Z79891 Long term (current) use of opiate analgesic: Secondary | ICD-10-CM | POA: Diagnosis not present

## 2019-07-06 DIAGNOSIS — R7303 Prediabetes: Secondary | ICD-10-CM | POA: Diagnosis not present

## 2019-07-06 DIAGNOSIS — G894 Chronic pain syndrome: Secondary | ICD-10-CM | POA: Diagnosis not present

## 2019-07-06 DIAGNOSIS — M545 Low back pain: Secondary | ICD-10-CM | POA: Diagnosis not present

## 2019-07-06 DIAGNOSIS — M79662 Pain in left lower leg: Secondary | ICD-10-CM | POA: Diagnosis not present

## 2019-07-06 DIAGNOSIS — G8929 Other chronic pain: Secondary | ICD-10-CM | POA: Diagnosis not present

## 2019-07-06 DIAGNOSIS — F1120 Opioid dependence, uncomplicated: Secondary | ICD-10-CM | POA: Diagnosis not present

## 2019-07-06 DIAGNOSIS — M79604 Pain in right leg: Secondary | ICD-10-CM | POA: Diagnosis not present

## 2019-07-06 DIAGNOSIS — M542 Cervicalgia: Secondary | ICD-10-CM | POA: Diagnosis not present

## 2019-07-06 DIAGNOSIS — Z6826 Body mass index (BMI) 26.0-26.9, adult: Secondary | ICD-10-CM | POA: Diagnosis not present

## 2019-07-06 DIAGNOSIS — M79605 Pain in left leg: Secondary | ICD-10-CM | POA: Diagnosis not present

## 2019-07-06 DIAGNOSIS — M79621 Pain in right upper arm: Secondary | ICD-10-CM | POA: Diagnosis not present

## 2019-08-03 DIAGNOSIS — G894 Chronic pain syndrome: Secondary | ICD-10-CM | POA: Diagnosis not present

## 2019-08-03 DIAGNOSIS — M79662 Pain in left lower leg: Secondary | ICD-10-CM | POA: Diagnosis not present

## 2019-08-03 DIAGNOSIS — F1120 Opioid dependence, uncomplicated: Secondary | ICD-10-CM | POA: Diagnosis not present

## 2019-08-03 DIAGNOSIS — M79604 Pain in right leg: Secondary | ICD-10-CM | POA: Diagnosis not present

## 2019-08-03 DIAGNOSIS — G629 Polyneuropathy, unspecified: Secondary | ICD-10-CM | POA: Diagnosis not present

## 2019-08-03 DIAGNOSIS — M79605 Pain in left leg: Secondary | ICD-10-CM | POA: Diagnosis not present

## 2019-08-03 DIAGNOSIS — M79621 Pain in right upper arm: Secondary | ICD-10-CM | POA: Diagnosis not present

## 2019-08-03 DIAGNOSIS — M542 Cervicalgia: Secondary | ICD-10-CM | POA: Diagnosis not present

## 2019-08-03 DIAGNOSIS — R7303 Prediabetes: Secondary | ICD-10-CM | POA: Diagnosis not present

## 2019-08-03 DIAGNOSIS — Z6826 Body mass index (BMI) 26.0-26.9, adult: Secondary | ICD-10-CM | POA: Diagnosis not present

## 2019-08-03 DIAGNOSIS — M79622 Pain in left upper arm: Secondary | ICD-10-CM | POA: Diagnosis not present

## 2019-08-03 DIAGNOSIS — G8929 Other chronic pain: Secondary | ICD-10-CM | POA: Diagnosis not present

## 2019-08-03 DIAGNOSIS — M79661 Pain in right lower leg: Secondary | ICD-10-CM | POA: Diagnosis not present

## 2019-08-03 DIAGNOSIS — M545 Low back pain: Secondary | ICD-10-CM | POA: Diagnosis not present

## 2019-08-03 DIAGNOSIS — Z79891 Long term (current) use of opiate analgesic: Secondary | ICD-10-CM | POA: Diagnosis not present

## 2019-08-18 DIAGNOSIS — M79661 Pain in right lower leg: Secondary | ICD-10-CM | POA: Diagnosis not present

## 2019-08-18 DIAGNOSIS — Z6826 Body mass index (BMI) 26.0-26.9, adult: Secondary | ICD-10-CM | POA: Diagnosis not present

## 2019-08-18 DIAGNOSIS — M79622 Pain in left upper arm: Secondary | ICD-10-CM | POA: Diagnosis not present

## 2019-08-18 DIAGNOSIS — R7303 Prediabetes: Secondary | ICD-10-CM | POA: Diagnosis not present

## 2019-08-18 DIAGNOSIS — M79662 Pain in left lower leg: Secondary | ICD-10-CM | POA: Diagnosis not present

## 2019-08-18 DIAGNOSIS — F1120 Opioid dependence, uncomplicated: Secondary | ICD-10-CM | POA: Diagnosis not present

## 2019-08-18 DIAGNOSIS — G629 Polyneuropathy, unspecified: Secondary | ICD-10-CM | POA: Diagnosis not present

## 2019-08-18 DIAGNOSIS — M79621 Pain in right upper arm: Secondary | ICD-10-CM | POA: Diagnosis not present

## 2019-08-18 DIAGNOSIS — Z79891 Long term (current) use of opiate analgesic: Secondary | ICD-10-CM | POA: Diagnosis not present

## 2019-08-18 DIAGNOSIS — M545 Low back pain: Secondary | ICD-10-CM | POA: Diagnosis not present

## 2019-08-18 DIAGNOSIS — M542 Cervicalgia: Secondary | ICD-10-CM | POA: Diagnosis not present

## 2019-08-18 DIAGNOSIS — G894 Chronic pain syndrome: Secondary | ICD-10-CM | POA: Diagnosis not present

## 2019-08-18 DIAGNOSIS — G8929 Other chronic pain: Secondary | ICD-10-CM | POA: Diagnosis not present

## 2019-08-18 DIAGNOSIS — M79605 Pain in left leg: Secondary | ICD-10-CM | POA: Diagnosis not present

## 2019-08-18 DIAGNOSIS — M79604 Pain in right leg: Secondary | ICD-10-CM | POA: Diagnosis not present

## 2019-09-05 ENCOUNTER — Encounter: Payer: Self-pay | Admitting: Adult Health

## 2019-09-05 ENCOUNTER — Ambulatory Visit (INDEPENDENT_AMBULATORY_CARE_PROVIDER_SITE_OTHER): Payer: PPO | Admitting: Adult Health

## 2019-09-05 ENCOUNTER — Other Ambulatory Visit: Payer: Self-pay

## 2019-09-05 VITALS — BP 148/80 | HR 75 | Temp 97.1°F | Ht 68.0 in | Wt 164.6 lb

## 2019-09-05 DIAGNOSIS — G43109 Migraine with aura, not intractable, without status migrainosus: Secondary | ICD-10-CM

## 2019-09-05 NOTE — Progress Notes (Signed)
I have read the note, and I agree with the clinical assessment and plan.  Jim Martin   

## 2019-09-05 NOTE — Progress Notes (Signed)
PATIENT: Jim Martin DOB: 06-28-1956  REASON FOR VISIT: follow up HISTORY FROM: patient  HISTORY OF PRESENT ILLNESS: Today 09/05/19:  Mr. Debenedictis is a 63 year old male with a history of basilar migraines.  He returns today for follow-up.  He remains on Topamax 100 mg at bedtime.  He reports that this is working well for him.  He states that is been quite a while since he had a headache.  He still states that looking up will sometimes induce his headaches.  He is currently on oxycodone due to chronic neck and low back pain.  This is not managed by our office.  He returns today for an evaluation.  HISTORY Mr. Miyata is a 63 year old left-handed white male with a history of chronic neck and low back pain, he also has basilar migraine.  He has done well on Topamax 100 mg at night.  He may have one minor headache every 2 to 3 months, he indicates that looking up will induce headaches sometimes.  The patient overall is quite pleased with his headache control.  He returns the office today for an evaluation.  REVIEW OF SYSTEMS: Out of a complete 14 system review of symptoms, the patient complains only of the following symptoms, and all other reviewed systems are negative.  See HPI  ALLERGIES: No Known Allergies  HOME MEDICATIONS: Outpatient Medications Prior to Visit  Medication Sig Dispense Refill  . amLODipine (NORVASC) 10 MG tablet Take 10 mg by mouth daily.    Marland Kitchen aspirin EC 81 MG tablet Take 81 mg by mouth daily.    . famotidine (PEPCID) 20 MG tablet Take 1 tablet (20 mg total) by mouth 2 (two) times daily. 30 tablet 0  . methocarbamol (ROBAXIN) 750 MG tablet Take 750 mg by mouth 3 (three) times daily.    . nabumetone (RELAFEN) 750 MG tablet Take 750 mg by mouth 2 (two) times daily.    Marland Kitchen oxyCODONE (ROXICODONE) 15 MG immediate release tablet Take 15 mg by mouth 5 (five) times daily.   0  . pantoprazole (PROTONIX) 20 MG tablet Take 20 mg by mouth once as needed for heartburn or indigestion.     . ranitidine (ZANTAC) 150 MG tablet Take 1 tablet (150 mg total) by mouth 2 (two) times daily. 60 tablet 0  . topiramate (TOPAMAX) 100 MG tablet Take 1 tablet (100 mg total) by mouth at bedtime. 90 tablet 3  . zolpidem (AMBIEN) 10 MG tablet Take 10 mg by mouth at bedtime as needed for sleep.     No facility-administered medications prior to visit.     PAST MEDICAL HISTORY: Past Medical History:  Diagnosis Date  . Anxiety   . 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 Chronic low back pain   . Hypertension     PAST SURGICAL HISTORY: Past Surgical History:  Procedure Laterality Date  . BRAIN SURGERY    . COLONOSCOPY  11/2009   Dr. Oletta Lamas: medium-sized internal hemorrhoids, normal   . lumbosacral spine surgery    . SPINAL CORD STIMULATOR IMPLANT      FAMILY HISTORY: Family History  Problem Relation Age of Onset  . Cancer Mother   . Heart attack Father   . Heart attack Brother   . Colon cancer Neg Hx     SOCIAL HISTORY: Social History   Socioeconomic History  . Marital status: Divorced    Spouse name: Not on file  . Number of children: 1  . Years of education: 8th  .  Highest education level: Not on file  Occupational History  . Occupation: disabilty  Social Needs  . Financial resource strain: Not on file  . Food insecurity    Worry: Not on file    Inability: Not on file  . Transportation needs    Medical: Not on file    Non-medical: Not on file  Tobacco Use  . Smoking status: Current Every Day Smoker    Packs/day: 1.50    Types: Cigarettes  . Smokeless tobacco: Never Used  Substance and Sexual Activity  . Alcohol use: No    Alcohol/week: 0.0 standard drinks  . Drug use: No  . Sexual activity: Not on file  Lifestyle  . Physical activity    Days per week: Not on file    Minutes per session: Not on file  . Stress: Not on file  Relationships  . Social Herbalist on phone: Not on file    Gets together: Not on file    Attends religious service: Not on  file    Active member of club or organization: Not on file    Attends meetings of clubs or organizations: Not on file    Relationship status: Not on file  . Intimate partner violence    Fear of current or ex partner: Not on file    Emotionally abused: Not on file    Physically abused: Not on file    Forced sexual activity: Not on file  Other Topics Concern  . Not on file  Social History Narrative   Patient is single with one child.   Patient is left handed.   Patient has an 8 th grade education.   Patient drinks 5 or more sodas daily.      PHYSICAL EXAM  Vitals:   09/05/19 0806  BP: (!) 148/80  Pulse: 75  Temp: (!) 97.1 F (36.2 C)  TempSrc: Oral  Weight: 164 lb 9.6 oz (74.7 kg)  Height: 5\' 8"  (1.727 m)   Body mass index is 25.03 kg/m.  Generalized: Well developed, in no acute distress   Neurological examination  Mentation: Alert oriented to time, place, history taking. Follows all commands speech and language fluent Cranial nerve II-XII: Pupils were equal round reactive to light. Extraocular movements were full, visual field were full on confrontational test- nystagmus noted with horizontal gaze to the right and left.  Head turning and shoulder shrug  were normal and symmetric. Motor: The motor testing reveals 5 over 5 strength of all 4 extremities. Good symmetric motor tone is noted throughout.  Sensory: Sensory testing is intact to soft touch on all 4 extremities. No evidence of extinction is noted.  Coordination: Cerebellar testing reveals good finger-nose-finger and heel-to-shin bilaterally.  Gait and station: Gait is normal.  Reflexes: Deep tendon reflexes are symmetric and normal bilaterally.   DIAGNOSTIC DATA (LABS, IMAGING, TESTING) - I reviewed patient records, labs, notes, testing and imaging myself where available.  Lab Results  Component Value Date   WBC 14.9 (H) 11/10/2016   HGB 16.2 11/10/2016   HCT 47.1 11/10/2016   MCV 95.2 11/10/2016   PLT 240  11/10/2016      Component Value Date/Time   NA 136 11/10/2016 2157   K 3.2 (L) 11/10/2016 2157   CL 102 11/10/2016 2157   CO2 26 11/10/2016 2157   GLUCOSE 141 (H) 11/10/2016 2157   BUN 13 11/10/2016 2157   CREATININE 0.84 11/10/2016 2157   CALCIUM 9.0 11/10/2016 2157  PROT 7.0 11/10/2016 2157   ALBUMIN 4.2 11/10/2016 2157   AST 75 (H) 11/10/2016 2157   ALT 42 11/10/2016 2157   ALKPHOS 96 11/10/2016 2157   BILITOT 1.9 (H) 11/10/2016 2157   GFRNONAA >60 11/10/2016 2157   GFRAA >60 11/10/2016 2157   Lab Results  Component Value Date   CHOL 187 12/10/2015   HDL 27 (L) 12/10/2015   LDLCALC 125 (H) 12/10/2015   TRIG 174 (H) 12/10/2015   CHOLHDL 6.9 12/10/2015   Lab Results  Component Value Date   HGBA1C 5.5 12/10/2015   No results found for: VITAMINB12 No results found for: TSH    ASSESSMENT AND PLAN 63 y.o. year old male  has a past medical history of Anxiety, Basilar migraine (10/05/2013), Chronic low back pain, and Hypertension. here with:  1.  Basilar migraine  Overall the patient is doing well.  He will continue on Topamax 100 mg at bedtime.  He is advised that if his symptoms worsen or he develops new symptoms he should let us know.  He will follow-up in 1 year or sooner if needed.   I spent 15 minutes with the patient. 50% of this time was spent Reviewing his plan of care   Ward Givens, MSN, NP-C 09/05/2019, 9:03 AM [facility] [address] [place], [place] [address] [phone]

## 2019-09-05 NOTE — Patient Instructions (Signed)
Your Plan:  Continue Topamax If your symptoms worsen or you develop new symptoms please let us know.   Thank you for coming to see us at [facility]. I hope we have been able to provide you high quality care today.  You may receive a patient satisfaction survey over the next few weeks. We would appreciate your feedback and comments so that we may continue to improve ourselves and the health of our patients.  

## 2019-09-15 DIAGNOSIS — Z6826 Body mass index (BMI) 26.0-26.9, adult: Secondary | ICD-10-CM | POA: Diagnosis not present

## 2019-09-15 DIAGNOSIS — G629 Polyneuropathy, unspecified: Secondary | ICD-10-CM | POA: Diagnosis not present

## 2019-09-15 DIAGNOSIS — M542 Cervicalgia: Secondary | ICD-10-CM | POA: Diagnosis not present

## 2019-09-15 DIAGNOSIS — M79622 Pain in left upper arm: Secondary | ICD-10-CM | POA: Diagnosis not present

## 2019-09-15 DIAGNOSIS — Z79891 Long term (current) use of opiate analgesic: Secondary | ICD-10-CM | POA: Diagnosis not present

## 2019-09-15 DIAGNOSIS — M79621 Pain in right upper arm: Secondary | ICD-10-CM | POA: Diagnosis not present

## 2019-09-15 DIAGNOSIS — M545 Low back pain: Secondary | ICD-10-CM | POA: Diagnosis not present

## 2019-09-15 DIAGNOSIS — G8929 Other chronic pain: Secondary | ICD-10-CM | POA: Diagnosis not present

## 2019-09-15 DIAGNOSIS — R7303 Prediabetes: Secondary | ICD-10-CM | POA: Diagnosis not present

## 2019-09-15 DIAGNOSIS — M79605 Pain in left leg: Secondary | ICD-10-CM | POA: Diagnosis not present

## 2019-09-15 DIAGNOSIS — M79662 Pain in left lower leg: Secondary | ICD-10-CM | POA: Diagnosis not present

## 2019-09-15 DIAGNOSIS — F1120 Opioid dependence, uncomplicated: Secondary | ICD-10-CM | POA: Diagnosis not present

## 2019-09-15 DIAGNOSIS — M79604 Pain in right leg: Secondary | ICD-10-CM | POA: Diagnosis not present

## 2019-09-15 DIAGNOSIS — G894 Chronic pain syndrome: Secondary | ICD-10-CM | POA: Diagnosis not present

## 2019-09-26 DIAGNOSIS — K219 Gastro-esophageal reflux disease without esophagitis: Secondary | ICD-10-CM | POA: Diagnosis not present

## 2019-09-26 DIAGNOSIS — Q245 Malformation of coronary vessels: Secondary | ICD-10-CM | POA: Diagnosis not present

## 2019-09-26 DIAGNOSIS — F17210 Nicotine dependence, cigarettes, uncomplicated: Secondary | ICD-10-CM | POA: Diagnosis not present

## 2019-09-26 DIAGNOSIS — M179 Osteoarthritis of knee, unspecified: Secondary | ICD-10-CM | POA: Diagnosis not present

## 2019-09-26 DIAGNOSIS — I10 Essential (primary) hypertension: Secondary | ICD-10-CM | POA: Diagnosis not present

## 2019-10-14 DIAGNOSIS — R7303 Prediabetes: Secondary | ICD-10-CM | POA: Diagnosis not present

## 2019-10-14 DIAGNOSIS — M79621 Pain in right upper arm: Secondary | ICD-10-CM | POA: Diagnosis not present

## 2019-10-14 DIAGNOSIS — M79622 Pain in left upper arm: Secondary | ICD-10-CM | POA: Diagnosis not present

## 2019-10-14 DIAGNOSIS — G8929 Other chronic pain: Secondary | ICD-10-CM | POA: Diagnosis not present

## 2019-10-14 DIAGNOSIS — M542 Cervicalgia: Secondary | ICD-10-CM | POA: Diagnosis not present

## 2019-10-14 DIAGNOSIS — Z6826 Body mass index (BMI) 26.0-26.9, adult: Secondary | ICD-10-CM | POA: Diagnosis not present

## 2019-10-14 DIAGNOSIS — M79661 Pain in right lower leg: Secondary | ICD-10-CM | POA: Diagnosis not present

## 2019-10-14 DIAGNOSIS — M79605 Pain in left leg: Secondary | ICD-10-CM | POA: Diagnosis not present

## 2019-10-14 DIAGNOSIS — G894 Chronic pain syndrome: Secondary | ICD-10-CM | POA: Diagnosis not present

## 2019-10-14 DIAGNOSIS — Z79891 Long term (current) use of opiate analgesic: Secondary | ICD-10-CM | POA: Diagnosis not present

## 2019-10-14 DIAGNOSIS — M545 Low back pain: Secondary | ICD-10-CM | POA: Diagnosis not present

## 2019-10-14 DIAGNOSIS — F1120 Opioid dependence, uncomplicated: Secondary | ICD-10-CM | POA: Diagnosis not present

## 2019-10-14 DIAGNOSIS — G629 Polyneuropathy, unspecified: Secondary | ICD-10-CM | POA: Diagnosis not present

## 2019-10-14 DIAGNOSIS — M79662 Pain in left lower leg: Secondary | ICD-10-CM | POA: Diagnosis not present

## 2019-10-14 DIAGNOSIS — M79604 Pain in right leg: Secondary | ICD-10-CM | POA: Diagnosis not present

## 2019-10-18 DIAGNOSIS — Z1211 Encounter for screening for malignant neoplasm of colon: Secondary | ICD-10-CM | POA: Diagnosis not present

## 2019-10-18 DIAGNOSIS — M545 Low back pain: Secondary | ICD-10-CM | POA: Diagnosis not present

## 2019-10-27 DIAGNOSIS — Z1159 Encounter for screening for other viral diseases: Secondary | ICD-10-CM | POA: Diagnosis not present

## 2019-11-01 DIAGNOSIS — K635 Polyp of colon: Secondary | ICD-10-CM | POA: Diagnosis not present

## 2019-11-01 DIAGNOSIS — K5730 Diverticulosis of large intestine without perforation or abscess without bleeding: Secondary | ICD-10-CM | POA: Diagnosis not present

## 2019-11-01 DIAGNOSIS — D120 Benign neoplasm of cecum: Secondary | ICD-10-CM | POA: Diagnosis not present

## 2019-11-01 DIAGNOSIS — Z1211 Encounter for screening for malignant neoplasm of colon: Secondary | ICD-10-CM | POA: Diagnosis not present

## 2019-11-02 ENCOUNTER — Other Ambulatory Visit: Payer: Self-pay | Admitting: Neurology

## 2019-11-04 DIAGNOSIS — K635 Polyp of colon: Secondary | ICD-10-CM | POA: Diagnosis not present

## 2019-11-04 DIAGNOSIS — D120 Benign neoplasm of cecum: Secondary | ICD-10-CM | POA: Diagnosis not present

## 2019-11-14 DIAGNOSIS — Z79891 Long term (current) use of opiate analgesic: Secondary | ICD-10-CM | POA: Diagnosis not present

## 2019-11-14 DIAGNOSIS — F1120 Opioid dependence, uncomplicated: Secondary | ICD-10-CM | POA: Diagnosis not present

## 2019-11-14 DIAGNOSIS — M79622 Pain in left upper arm: Secondary | ICD-10-CM | POA: Diagnosis not present

## 2019-11-14 DIAGNOSIS — M79605 Pain in left leg: Secondary | ICD-10-CM | POA: Diagnosis not present

## 2019-11-14 DIAGNOSIS — G894 Chronic pain syndrome: Secondary | ICD-10-CM | POA: Diagnosis not present

## 2019-11-14 DIAGNOSIS — M79621 Pain in right upper arm: Secondary | ICD-10-CM | POA: Diagnosis not present

## 2019-11-14 DIAGNOSIS — R7303 Prediabetes: Secondary | ICD-10-CM | POA: Diagnosis not present

## 2019-11-14 DIAGNOSIS — M79662 Pain in left lower leg: Secondary | ICD-10-CM | POA: Diagnosis not present

## 2019-11-14 DIAGNOSIS — Z6826 Body mass index (BMI) 26.0-26.9, adult: Secondary | ICD-10-CM | POA: Diagnosis not present

## 2019-11-14 DIAGNOSIS — M542 Cervicalgia: Secondary | ICD-10-CM | POA: Diagnosis not present

## 2019-11-14 DIAGNOSIS — M79604 Pain in right leg: Secondary | ICD-10-CM | POA: Diagnosis not present

## 2019-11-14 DIAGNOSIS — G629 Polyneuropathy, unspecified: Secondary | ICD-10-CM | POA: Diagnosis not present

## 2019-11-14 DIAGNOSIS — M545 Low back pain: Secondary | ICD-10-CM | POA: Diagnosis not present

## 2019-11-14 DIAGNOSIS — G8929 Other chronic pain: Secondary | ICD-10-CM | POA: Diagnosis not present

## 2019-11-14 DIAGNOSIS — M79661 Pain in right lower leg: Secondary | ICD-10-CM | POA: Diagnosis not present

## 2019-12-12 DIAGNOSIS — M79662 Pain in left lower leg: Secondary | ICD-10-CM | POA: Diagnosis not present

## 2019-12-12 DIAGNOSIS — Z79891 Long term (current) use of opiate analgesic: Secondary | ICD-10-CM | POA: Diagnosis not present

## 2019-12-12 DIAGNOSIS — Z6826 Body mass index (BMI) 26.0-26.9, adult: Secondary | ICD-10-CM | POA: Diagnosis not present

## 2019-12-12 DIAGNOSIS — F1120 Opioid dependence, uncomplicated: Secondary | ICD-10-CM | POA: Diagnosis not present

## 2019-12-12 DIAGNOSIS — M545 Low back pain: Secondary | ICD-10-CM | POA: Diagnosis not present

## 2019-12-12 DIAGNOSIS — M79604 Pain in right leg: Secondary | ICD-10-CM | POA: Diagnosis not present

## 2019-12-12 DIAGNOSIS — M79621 Pain in right upper arm: Secondary | ICD-10-CM | POA: Diagnosis not present

## 2019-12-12 DIAGNOSIS — G894 Chronic pain syndrome: Secondary | ICD-10-CM | POA: Diagnosis not present

## 2019-12-12 DIAGNOSIS — G629 Polyneuropathy, unspecified: Secondary | ICD-10-CM | POA: Diagnosis not present

## 2019-12-12 DIAGNOSIS — M79605 Pain in left leg: Secondary | ICD-10-CM | POA: Diagnosis not present

## 2019-12-12 DIAGNOSIS — M79622 Pain in left upper arm: Secondary | ICD-10-CM | POA: Diagnosis not present

## 2019-12-12 DIAGNOSIS — M79661 Pain in right lower leg: Secondary | ICD-10-CM | POA: Diagnosis not present

## 2019-12-12 DIAGNOSIS — R7303 Prediabetes: Secondary | ICD-10-CM | POA: Diagnosis not present

## 2019-12-12 DIAGNOSIS — G8929 Other chronic pain: Secondary | ICD-10-CM | POA: Diagnosis not present

## 2019-12-12 DIAGNOSIS — M542 Cervicalgia: Secondary | ICD-10-CM | POA: Diagnosis not present

## 2019-12-26 DIAGNOSIS — K219 Gastro-esophageal reflux disease without esophagitis: Secondary | ICD-10-CM | POA: Diagnosis not present

## 2019-12-26 DIAGNOSIS — I10 Essential (primary) hypertension: Secondary | ICD-10-CM | POA: Diagnosis not present

## 2019-12-26 DIAGNOSIS — Q245 Malformation of coronary vessels: Secondary | ICD-10-CM | POA: Diagnosis not present

## 2019-12-26 DIAGNOSIS — F17210 Nicotine dependence, cigarettes, uncomplicated: Secondary | ICD-10-CM | POA: Diagnosis not present

## 2020-01-09 DIAGNOSIS — M79662 Pain in left lower leg: Secondary | ICD-10-CM | POA: Diagnosis not present

## 2020-01-09 DIAGNOSIS — G8929 Other chronic pain: Secondary | ICD-10-CM | POA: Diagnosis not present

## 2020-01-09 DIAGNOSIS — M79605 Pain in left leg: Secondary | ICD-10-CM | POA: Diagnosis not present

## 2020-01-09 DIAGNOSIS — M545 Low back pain: Secondary | ICD-10-CM | POA: Diagnosis not present

## 2020-01-09 DIAGNOSIS — Z79891 Long term (current) use of opiate analgesic: Secondary | ICD-10-CM | POA: Diagnosis not present

## 2020-01-09 DIAGNOSIS — M542 Cervicalgia: Secondary | ICD-10-CM | POA: Diagnosis not present

## 2020-01-09 DIAGNOSIS — M79661 Pain in right lower leg: Secondary | ICD-10-CM | POA: Diagnosis not present

## 2020-01-09 DIAGNOSIS — M79622 Pain in left upper arm: Secondary | ICD-10-CM | POA: Diagnosis not present

## 2020-01-09 DIAGNOSIS — M79604 Pain in right leg: Secondary | ICD-10-CM | POA: Diagnosis not present

## 2020-01-09 DIAGNOSIS — G629 Polyneuropathy, unspecified: Secondary | ICD-10-CM | POA: Diagnosis not present

## 2020-01-09 DIAGNOSIS — M79621 Pain in right upper arm: Secondary | ICD-10-CM | POA: Diagnosis not present

## 2020-01-09 DIAGNOSIS — Z6826 Body mass index (BMI) 26.0-26.9, adult: Secondary | ICD-10-CM | POA: Diagnosis not present

## 2020-01-09 DIAGNOSIS — F1120 Opioid dependence, uncomplicated: Secondary | ICD-10-CM | POA: Diagnosis not present

## 2020-01-09 DIAGNOSIS — G894 Chronic pain syndrome: Secondary | ICD-10-CM | POA: Diagnosis not present

## 2020-01-09 DIAGNOSIS — R7303 Prediabetes: Secondary | ICD-10-CM | POA: Diagnosis not present

## 2020-02-06 DIAGNOSIS — M79604 Pain in right leg: Secondary | ICD-10-CM | POA: Diagnosis not present

## 2020-02-06 DIAGNOSIS — M79661 Pain in right lower leg: Secondary | ICD-10-CM | POA: Diagnosis not present

## 2020-02-06 DIAGNOSIS — G8929 Other chronic pain: Secondary | ICD-10-CM | POA: Diagnosis not present

## 2020-02-06 DIAGNOSIS — Z6826 Body mass index (BMI) 26.0-26.9, adult: Secondary | ICD-10-CM | POA: Diagnosis not present

## 2020-02-06 DIAGNOSIS — M545 Low back pain: Secondary | ICD-10-CM | POA: Diagnosis not present

## 2020-02-06 DIAGNOSIS — Z79891 Long term (current) use of opiate analgesic: Secondary | ICD-10-CM | POA: Diagnosis not present

## 2020-02-06 DIAGNOSIS — G629 Polyneuropathy, unspecified: Secondary | ICD-10-CM | POA: Diagnosis not present

## 2020-02-06 DIAGNOSIS — M79622 Pain in left upper arm: Secondary | ICD-10-CM | POA: Diagnosis not present

## 2020-02-06 DIAGNOSIS — M542 Cervicalgia: Secondary | ICD-10-CM | POA: Diagnosis not present

## 2020-02-06 DIAGNOSIS — F1120 Opioid dependence, uncomplicated: Secondary | ICD-10-CM | POA: Diagnosis not present

## 2020-02-06 DIAGNOSIS — R7303 Prediabetes: Secondary | ICD-10-CM | POA: Diagnosis not present

## 2020-02-06 DIAGNOSIS — M79662 Pain in left lower leg: Secondary | ICD-10-CM | POA: Diagnosis not present

## 2020-02-06 DIAGNOSIS — M79605 Pain in left leg: Secondary | ICD-10-CM | POA: Diagnosis not present

## 2020-02-06 DIAGNOSIS — M79621 Pain in right upper arm: Secondary | ICD-10-CM | POA: Diagnosis not present

## 2020-02-06 DIAGNOSIS — G894 Chronic pain syndrome: Secondary | ICD-10-CM | POA: Diagnosis not present

## 2020-02-29 DIAGNOSIS — Z03818 Encounter for observation for suspected exposure to other biological agents ruled out: Secondary | ICD-10-CM | POA: Diagnosis not present

## 2020-03-05 DIAGNOSIS — M79661 Pain in right lower leg: Secondary | ICD-10-CM | POA: Diagnosis not present

## 2020-03-05 DIAGNOSIS — M79622 Pain in left upper arm: Secondary | ICD-10-CM | POA: Diagnosis not present

## 2020-03-05 DIAGNOSIS — M79621 Pain in right upper arm: Secondary | ICD-10-CM | POA: Diagnosis not present

## 2020-03-05 DIAGNOSIS — M545 Low back pain: Secondary | ICD-10-CM | POA: Diagnosis not present

## 2020-03-05 DIAGNOSIS — M542 Cervicalgia: Secondary | ICD-10-CM | POA: Diagnosis not present

## 2020-03-05 DIAGNOSIS — R7303 Prediabetes: Secondary | ICD-10-CM | POA: Diagnosis not present

## 2020-03-05 DIAGNOSIS — Z6826 Body mass index (BMI) 26.0-26.9, adult: Secondary | ICD-10-CM | POA: Diagnosis not present

## 2020-03-05 DIAGNOSIS — M79605 Pain in left leg: Secondary | ICD-10-CM | POA: Diagnosis not present

## 2020-03-05 DIAGNOSIS — G8929 Other chronic pain: Secondary | ICD-10-CM | POA: Diagnosis not present

## 2020-03-05 DIAGNOSIS — G629 Polyneuropathy, unspecified: Secondary | ICD-10-CM | POA: Diagnosis not present

## 2020-03-05 DIAGNOSIS — M79604 Pain in right leg: Secondary | ICD-10-CM | POA: Diagnosis not present

## 2020-03-05 DIAGNOSIS — G894 Chronic pain syndrome: Secondary | ICD-10-CM | POA: Diagnosis not present

## 2020-03-05 DIAGNOSIS — M79662 Pain in left lower leg: Secondary | ICD-10-CM | POA: Diagnosis not present

## 2020-03-05 DIAGNOSIS — Z79891 Long term (current) use of opiate analgesic: Secondary | ICD-10-CM | POA: Diagnosis not present

## 2020-03-05 DIAGNOSIS — F1120 Opioid dependence, uncomplicated: Secondary | ICD-10-CM | POA: Diagnosis not present

## 2020-03-26 DIAGNOSIS — Z125 Encounter for screening for malignant neoplasm of prostate: Secondary | ICD-10-CM | POA: Diagnosis not present

## 2020-03-26 DIAGNOSIS — I10 Essential (primary) hypertension: Secondary | ICD-10-CM | POA: Diagnosis not present

## 2020-03-26 DIAGNOSIS — K219 Gastro-esophageal reflux disease without esophagitis: Secondary | ICD-10-CM | POA: Diagnosis not present

## 2020-03-26 DIAGNOSIS — Z131 Encounter for screening for diabetes mellitus: Secondary | ICD-10-CM | POA: Diagnosis not present

## 2020-03-26 DIAGNOSIS — Z0000 Encounter for general adult medical examination without abnormal findings: Secondary | ICD-10-CM | POA: Diagnosis not present

## 2020-03-26 DIAGNOSIS — F17210 Nicotine dependence, cigarettes, uncomplicated: Secondary | ICD-10-CM | POA: Diagnosis not present

## 2020-03-26 DIAGNOSIS — E559 Vitamin D deficiency, unspecified: Secondary | ICD-10-CM | POA: Diagnosis not present

## 2020-03-26 DIAGNOSIS — Z716 Tobacco abuse counseling: Secondary | ICD-10-CM | POA: Diagnosis not present

## 2020-03-26 DIAGNOSIS — Z13220 Encounter for screening for lipoid disorders: Secondary | ICD-10-CM | POA: Diagnosis not present

## 2020-03-26 DIAGNOSIS — G894 Chronic pain syndrome: Secondary | ICD-10-CM | POA: Diagnosis not present

## 2020-04-02 DIAGNOSIS — G894 Chronic pain syndrome: Secondary | ICD-10-CM | POA: Diagnosis not present

## 2020-04-02 DIAGNOSIS — G8929 Other chronic pain: Secondary | ICD-10-CM | POA: Diagnosis not present

## 2020-04-02 DIAGNOSIS — G629 Polyneuropathy, unspecified: Secondary | ICD-10-CM | POA: Diagnosis not present

## 2020-04-02 DIAGNOSIS — M79662 Pain in left lower leg: Secondary | ICD-10-CM | POA: Diagnosis not present

## 2020-04-02 DIAGNOSIS — M79661 Pain in right lower leg: Secondary | ICD-10-CM | POA: Diagnosis not present

## 2020-04-02 DIAGNOSIS — M545 Low back pain: Secondary | ICD-10-CM | POA: Diagnosis not present

## 2020-04-02 DIAGNOSIS — M79621 Pain in right upper arm: Secondary | ICD-10-CM | POA: Diagnosis not present

## 2020-04-02 DIAGNOSIS — F1120 Opioid dependence, uncomplicated: Secondary | ICD-10-CM | POA: Diagnosis not present

## 2020-04-02 DIAGNOSIS — M79622 Pain in left upper arm: Secondary | ICD-10-CM | POA: Diagnosis not present

## 2020-04-02 DIAGNOSIS — Z79891 Long term (current) use of opiate analgesic: Secondary | ICD-10-CM | POA: Diagnosis not present

## 2020-04-02 DIAGNOSIS — M542 Cervicalgia: Secondary | ICD-10-CM | POA: Diagnosis not present

## 2020-04-02 DIAGNOSIS — M79604 Pain in right leg: Secondary | ICD-10-CM | POA: Diagnosis not present

## 2020-04-02 DIAGNOSIS — M79605 Pain in left leg: Secondary | ICD-10-CM | POA: Diagnosis not present

## 2020-04-02 DIAGNOSIS — Z6826 Body mass index (BMI) 26.0-26.9, adult: Secondary | ICD-10-CM | POA: Diagnosis not present

## 2020-04-02 DIAGNOSIS — R7303 Prediabetes: Secondary | ICD-10-CM | POA: Diagnosis not present

## 2020-04-15 ENCOUNTER — Encounter (HOSPITAL_COMMUNITY): Payer: Self-pay | Admitting: *Deleted

## 2020-04-15 ENCOUNTER — Other Ambulatory Visit: Payer: Self-pay

## 2020-04-15 ENCOUNTER — Inpatient Hospital Stay (HOSPITAL_COMMUNITY)
Admission: EM | Admit: 2020-04-15 | Discharge: 2020-04-17 | DRG: 246 | Disposition: A | Discharge status: Home / Self Care | Payer: PPO | Attending: Interventional Cardiology | Admitting: Interventional Cardiology

## 2020-04-15 ENCOUNTER — Emergency Department (HOSPITAL_COMMUNITY): Payer: PPO

## 2020-04-15 DIAGNOSIS — R008 Other abnormalities of heart beat: Secondary | ICD-10-CM | POA: Diagnosis present

## 2020-04-15 DIAGNOSIS — I10 Essential (primary) hypertension: Secondary | ICD-10-CM | POA: Diagnosis not present

## 2020-04-15 DIAGNOSIS — I110 Hypertensive heart disease with heart failure: Secondary | ICD-10-CM | POA: Diagnosis present

## 2020-04-15 DIAGNOSIS — I2510 Atherosclerotic heart disease of native coronary artery without angina pectoris: Secondary | ICD-10-CM | POA: Diagnosis not present

## 2020-04-15 DIAGNOSIS — Z79891 Long term (current) use of opiate analgesic: Secondary | ICD-10-CM

## 2020-04-15 DIAGNOSIS — I513 Intracardiac thrombosis, not elsewhere classified: Secondary | ICD-10-CM

## 2020-04-15 DIAGNOSIS — Z20822 Contact with and (suspected) exposure to covid-19: Secondary | ICD-10-CM | POA: Diagnosis not present

## 2020-04-15 DIAGNOSIS — Z7982 Long term (current) use of aspirin: Secondary | ICD-10-CM

## 2020-04-15 DIAGNOSIS — Z79899 Other long term (current) drug therapy: Secondary | ICD-10-CM | POA: Diagnosis not present

## 2020-04-15 DIAGNOSIS — I248 Other forms of acute ischemic heart disease: Secondary | ICD-10-CM | POA: Diagnosis not present

## 2020-04-15 DIAGNOSIS — Z8249 Family history of ischemic heart disease and other diseases of the circulatory system: Secondary | ICD-10-CM | POA: Diagnosis not present

## 2020-04-15 DIAGNOSIS — I071 Rheumatic tricuspid insufficiency: Secondary | ICD-10-CM | POA: Diagnosis not present

## 2020-04-15 DIAGNOSIS — I214 Non-ST elevation (NSTEMI) myocardial infarction: Secondary | ICD-10-CM | POA: Diagnosis not present

## 2020-04-15 DIAGNOSIS — F17210 Nicotine dependence, cigarettes, uncomplicated: Secondary | ICD-10-CM | POA: Diagnosis present

## 2020-04-15 DIAGNOSIS — G8929 Other chronic pain: Secondary | ICD-10-CM | POA: Diagnosis not present

## 2020-04-15 DIAGNOSIS — E785 Hyperlipidemia, unspecified: Secondary | ICD-10-CM

## 2020-04-15 DIAGNOSIS — E782 Mixed hyperlipidemia: Secondary | ICD-10-CM | POA: Diagnosis not present

## 2020-04-15 DIAGNOSIS — R079 Chest pain, unspecified: Secondary | ICD-10-CM | POA: Diagnosis not present

## 2020-04-15 DIAGNOSIS — I5041 Acute combined systolic (congestive) and diastolic (congestive) heart failure: Secondary | ICD-10-CM | POA: Diagnosis not present

## 2020-04-15 DIAGNOSIS — K219 Gastro-esophageal reflux disease without esophagitis: Secondary | ICD-10-CM | POA: Diagnosis not present

## 2020-04-15 DIAGNOSIS — M545 Low back pain, unspecified: Secondary | ICD-10-CM | POA: Diagnosis present

## 2020-04-15 DIAGNOSIS — Z9682 Presence of neurostimulator: Secondary | ICD-10-CM

## 2020-04-15 DIAGNOSIS — I313 Pericardial effusion (noninflammatory): Secondary | ICD-10-CM | POA: Diagnosis present

## 2020-04-15 DIAGNOSIS — R0789 Other chest pain: Secondary | ICD-10-CM | POA: Diagnosis not present

## 2020-04-15 DIAGNOSIS — Z720 Tobacco use: Secondary | ICD-10-CM | POA: Diagnosis not present

## 2020-04-15 DIAGNOSIS — Z955 Presence of coronary angioplasty implant and graft: Secondary | ICD-10-CM

## 2020-04-15 HISTORY — DX: Atherosclerotic heart disease of native coronary artery without angina pectoris: I25.10

## 2020-04-15 LAB — BASIC METABOLIC PANEL
Anion gap: 10 (ref 5–15)
BUN: 12 mg/dL (ref 8–23)
CO2: 21 mmol/L — ABNORMAL LOW (ref 22–32)
Calcium: 9.2 mg/dL (ref 8.9–10.3)
Chloride: 106 mmol/L (ref 98–111)
Creatinine, Ser: 0.91 mg/dL (ref 0.61–1.24)
GFR calc Af Amer: >60 mL/min (ref >60)
GFR calc non Af Amer: >60 mL/min (ref >60)
Glucose, Bld: 133 mg/dL — ABNORMAL HIGH (ref 70–99)
Potassium: 3.4 mmol/L — ABNORMAL LOW (ref 3.5–5.1)
Sodium: 137 mmol/L (ref 135–145)

## 2020-04-15 LAB — CBC
HCT: 50.5 % (ref 39.0–52.0)
Hemoglobin: 16.5 g/dL (ref 13.0–17.0)
MCH: 31.3 pg (ref 26.0–34.0)
MCHC: 32.7 g/dL (ref 30.0–36.0)
MCV: 95.8 fL (ref 80.0–100.0)
Platelets: 273 10*3/uL (ref 150–400)
RBC: 5.27 MIL/uL (ref 4.22–5.81)
RDW: 13.2 % (ref 11.5–15.5)
WBC: 13.4 10*3/uL — ABNORMAL HIGH (ref 4.0–10.5)
nRBC: 0 % (ref 0.0–0.2)

## 2020-04-15 LAB — TROPONIN I (HIGH SENSITIVITY)
Troponin I (High Sensitivity): 1134 ng/L (ref <18)
Troponin I (High Sensitivity): 1258 ng/L (ref <18)
Troponin I (High Sensitivity): 1021 ng/L (ref <18)

## 2020-04-15 LAB — RESPIRATORY PANEL BY RT PCR (FLU A&B, COVID)
Influenza A by PCR: NEGATIVE
Influenza B by PCR: NEGATIVE
SARS Coronavirus 2 by RT PCR: NEGATIVE

## 2020-04-15 LAB — HEPARIN LEVEL (UNFRACTIONATED): Heparin Unfractionated: 0.22 IU/mL — ABNORMAL LOW (ref 0.30–0.70)

## 2020-04-15 MED ORDER — HEPARIN (PORCINE) 25000 UT/250ML-% IV SOLN: 900.0000 [IU]/h | INTRAVENOUS | Status: DC

## 2020-04-15 MED ORDER — HEPARIN (PORCINE) 25000 UT/250ML-% IV SOLN
1300.0000 [IU]/h | INTRAVENOUS | Status: DC
  Administered 2020-04-15: 900 [IU]/h via INTRAVENOUS
  Filled 2020-04-15: qty 250

## 2020-04-15 MED ORDER — HEPARIN BOLUS VIA INFUSION: 4000.0000 [IU] | Freq: Once | INTRAVENOUS | Status: DC

## 2020-04-15 MED ORDER — HEPARIN SODIUM (PORCINE) 5000 UNIT/ML IJ SOLN
4000.0000 [IU] | Freq: Once | INTRAMUSCULAR | Status: AC
  Administered 2020-04-15: 4000 [IU] via INTRAVENOUS
  Filled 2020-04-15: qty 1

## 2020-04-15 MED ORDER — NITROGLYCERIN 0.4 MG SL SUBL
0.4000 mg | SUBLINGUAL_TABLET | Freq: Once | SUBLINGUAL | Status: AC
  Administered 2020-04-15: 0.4 mg via SUBLINGUAL
  Filled 2020-04-15: qty 1

## 2020-04-15 MED ORDER — NITROGLYCERIN 0.4 MG SL SUBL
SUBLINGUAL_TABLET | SUBLINGUAL | Status: AC
  Filled 2020-04-15: qty 1

## 2020-04-15 MED ORDER — ASPIRIN 81 MG PO CHEW
324.0000 mg | CHEWABLE_TABLET | Freq: Once | ORAL | Status: AC
  Administered 2020-04-15: 324 mg via ORAL
  Filled 2020-04-15: qty 4

## 2020-04-15 MED ORDER — SODIUM CHLORIDE 0.9% FLUSH: 3.0000 mL | Freq: Once | INTRAVENOUS | Status: DC

## 2020-04-15 MED ORDER — HEPARIN (PORCINE) 25000 UT/250ML-% IV SOLN: 14.0000 [IU]/kg/h | INTRAVENOUS | Status: DC

## 2020-04-15 NOTE — ED Triage Notes (Signed)
Pt with left sided cp since Friday that radiates left arm and left neck.

## 2020-04-15 NOTE — ED Provider Notes (Signed)
[facility] EMERGENCY DEPARTMENT Provider Note   CSN: [id number] Arrival date & time: 04/15/20  1406     History Chief Complaint  Patient presents with  . Chest Pain    Jim Martin is a 64 y.o. male.  HPI  Patient complains of intermittent chest pain that began Friday at 8 AM states that it radiates into his left arm it feels like a heavy pressure.  He has associated nausea but no vomiting.  He states that it lasted for approximately 3 hours and resolved.  He states that he felt hot and weak during this period as well.  He states that the next day he had another onset of chest pain around 9:30 AM that lasted 30 minutes after he laid down.  He states that it would hurt anytime he got up and exerting himself.  He states that this morning he went to church and had sudden onset of similar chest pain and came immediately to emergency department.  Patient is a current smoker with approximately 25 pack-year smoking history.  He has hypertension but no history of diabetes, hyperlipidemia, or obesity.  No history of blood clots, no recent surgeries, immobilization, is not on any hormone medication.  No calf tenderness or swelling.  Patient denies any recent URI symptoms no history of pericarditis/myocarditis.  No lupus or other autoimmune disease.    Past Medical History:  Diagnosis Date  . Anxiety   . 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 Chronic low back pain   . Hypertension     Patient Active Problem List   Diagnosis Date Noted  . Elevated LFTs 09/13/2016  . Chest pain, rule out acute myocardial infarction 12/10/2015  . Hypertension 12/10/2015  . Chronic low back pain 12/10/2015  . Anxiety 12/10/2015  . Chest pain of uncertain etiology [id number]  . Hyperglycemia 12/10/2015  . 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 BRAIN SURGERY    . COLONOSCOPY  11/2009   Dr. Oletta Lamas: medium-sized internal hemorrhoids, normal   . lumbosacral spine  surgery    . SPINAL CORD STIMULATOR IMPLANT         Family History  Problem Relation Age of Onset  . Cancer Mother   . Heart attack Father   . Heart attack Brother   . Colon cancer Neg Hx     Social History   Tobacco Use  . Smoking status: Current Every Day Smoker    Packs/day: 1.50    Types: Cigarettes  . Smokeless tobacco: Never Used  Substance Use Topics  . Alcohol use: No    Alcohol/week: 0.0 standard drinks  . Drug use: No    Home Medications Prior to Admission medications   Medication Sig Start Date End Date Taking? Authorizing Provider  amLODipine (NORVASC) 10 MG tablet Take 10 mg by mouth daily. 09/16/13  Yes [provider]  aspirin EC 81 MG tablet Take 81 mg by mouth daily.   Yes [provider]  DULoxetine (CYMBALTA) 30 MG capsule Take 30 mg by mouth daily. 04/03/20  Yes [provider]  famotidine (PEPCID) 20 MG tablet Take 1 tablet (20 mg total) by mouth 2 (two) times daily. 11/11/16  Yes Noemi Chapel, MD  folic acid (FOLVITE) 1 MG tablet Take 1 mg by mouth daily. 04/03/20  Yes [provider]  gabapentin (NEURONTIN) 300 MG capsule Take 300 mg by mouth 3 (three) times daily. 04/05/20  Yes [provider]  methocarbamol (ROBAXIN)  750 MG tablet Take 750 mg by mouth 3 (three) times daily.   Yes [provider]  nabumetone (RELAFEN) 750 MG tablet Take 750 mg by mouth 2 (two) times daily.   Yes [provider]  oxyCODONE (ROXICODONE) 15 MG immediate release tablet Take 15 mg by mouth 5 (five) times daily.  02/06/15  Yes [provider]  pantoprazole (PROTONIX) 40 MG tablet Take 40 mg by mouth daily. 04/03/20  Yes [provider]  topiramate (TOPAMAX) 100 MG tablet TAKE ONE TABLET BY MOUTH AT BEDTIME. 11/02/19  Yes Kathrynn Ducking, MD  zolpidem (AMBIEN) 10 MG tablet Take 10 mg by mouth at bedtime as needed for sleep.   Yes [provider]    Allergies    Patient has no known  allergies.  Review of Systems   Review of Systems  Constitutional: Positive for fatigue. Negative for chills and fever.  HENT: Negative for congestion.   Eyes: Negative for pain.  Respiratory: Positive for chest tightness and shortness of breath. Negative for cough.   Cardiovascular: Positive for chest pain. Negative for leg swelling.  Gastrointestinal: Positive for nausea. Negative for abdominal pain, diarrhea and vomiting.  Genitourinary: Negative for dysuria.  Musculoskeletal: Negative for myalgias.  Skin: Negative for rash.  Neurological: Positive for light-headedness. Negative for dizziness and headaches.    Physical Exam Updated Vital Signs BP (!) 127/95   Pulse 76   Temp 97.8 F (36.6 C) (Oral)   Resp 13   Ht 5\' 6"  (1.676 m)   Wt 71.7 kg   SpO2 94%   BMI 25.50 kg/m   Physical Exam Vitals and nursing note reviewed.  Constitutional:      General: He is not in acute distress.    Comments: Patient is a 64 year old male appears fatigued but in no acute distress.  HENT:     Head: Normocephalic and atraumatic.     Nose: Nose normal.  Eyes:     General: No scleral icterus. Cardiovascular:     Rate and Rhythm: Normal rate and regular rhythm.     Pulses: Normal pulses.     Heart sounds: Normal heart sounds.  Pulmonary:     Effort: Pulmonary effort is normal. No respiratory distress.     Breath sounds: No wheezing.  Abdominal:     Palpations: Abdomen is soft.     Tenderness: There is no abdominal tenderness.  Musculoskeletal:     Cervical back: Normal range of motion.     Right lower leg: No edema.     Left lower leg: No edema.     Comments: No lower extremity edema.  No calf tenderness.  Skin:    General: Skin is warm and dry.     Capillary Refill: Capillary refill takes less than 2 seconds.  Neurological:     Mental Status: He is alert. Mental status is at baseline.  Psychiatric:        Mood and Affect: Mood normal.        Behavior: Behavior normal.      ED Results / Procedures / Treatments   Labs (all labs ordered are listed, but only abnormal results are displayed) Labs Reviewed  BASIC METABOLIC PANEL - Abnormal; Notable for the following components:      Result Value   Potassium 3.4 (*)    CO2 21 (*)    Glucose, Bld 133 (*)    All other components within normal limits  CBC - Abnormal; Notable for the following  components:   WBC 13.4 (*)    All other components within normal limits  TROPONIN I (HIGH SENSITIVITY) - Abnormal; Notable for the following components:   Troponin I (High Sensitivity) 1,134 (*)    All other components within normal limits  TROPONIN I (HIGH SENSITIVITY) - Abnormal; Notable for the following components:   Troponin I (High Sensitivity) 1,258 (*)    All other components within normal limits  RESPIRATORY PANEL BY RT PCR (FLU A&B, COVID)  HEPARIN LEVEL (UNFRACTIONATED)  HEPARIN LEVEL (UNFRACTIONATED)  CBC    EKG EKG Interpretation  Date/Time:  Sunday Apr 15 2020 16:25:51 EDT Ventricular Rate:  75 PR Interval:  154 QRS Duration: 102 QT Interval:  448 QTC Calculation: 501 R Axis:   90 Text Interpretation: Sinus rhythm Borderline right axis deviation Abnormal T, probable ischemia, widespread Prolonged QT interval No significant change since last tracing Confirmed by Calvert Cantor [phone]) on 04/15/2020 4:31:56 PM   Radiology DG Chest Portable 1 View  Result Date: 04/15/2020 CLINICAL DATA:  Pt with left sided cp since Friday that radiates left arm and left neck. EXAM: PORTABLE CHEST 1 VIEW COMPARISON:  Chest radiograph 11/10/2016 FINDINGS: Stable cardiomediastinal contours. A spinal stimulator projects over the lower mediastinum. The lungs are clear. No pneumothorax or significant pleural effusion. No acute finding in the visualized skeleton. IMPRESSION: No acute cardiopulmonary finding. Electronically Signed   By: Audie Pinto M.D.   On: 04/15/2020 14:43    Procedures .Critical  Care Performed by: Tedd Sias, PA Authorized by: Tedd Sias, PA   Critical care provider statement:    Critical care time (minutes):  35   Critical care time was exclusive of:  Separately billable procedures and treating other patients and teaching time   Critical care was necessary to treat or prevent imminent or life-threatening deterioration of the following conditions: NSTEMI/STEMI.   Critical care was time spent personally by me on the following activities:  Discussions with consultants, evaluation of patient's response to treatment, examination of patient, review of old charts, re-evaluation of patient's condition, pulse oximetry, ordering and review of radiographic studies, ordering and review of laboratory studies and ordering and performing treatments and interventions   I assumed direction of critical care for this patient from another provider in my specialty: no     (including critical care time)   Medications Ordered in ED Medications  sodium chloride flush (NS) 0.9 % injection 3 mL (0 mLs Intravenous Hold 04/15/20 1509)  heparin ADULT infusion 100 units/mL (25000 units/250mL sodium chloride 0.45%) (900 Units/hr Intravenous New Bag/Given 04/15/20 1609)  nitroGLYCERIN (NITROSTAT) SL tablet 0.4 mg (0.4 mg Sublingual Given 04/15/20 1624)  aspirin chewable tablet 324 mg (324 mg Oral Given 04/15/20 1545)  heparin injection 4,000 Units (4,000 Units Intravenous Given 04/15/20 1545)    ED Course  I have reviewed the triage vital signs and the nursing notes.  Pertinent labs & imaging results that were available during my care of the patient were reviewed by me and considered in my medical decision making (see chart for details).  Patient is 64 year old male with history and physical exam concerning for acute coronary syndrome/NSTEMI   Patient given aspirin 324  Troponin X1 is elevated at 1100 EKG with concerning ST changes, repeated multiple times shows changes concerning for  ischemia without frank ST elevation.  STEMI not activated at this time.  BNP without any remarkable abnormalities.  Very mild hypokalemia.  CBC with a mild leukocytosis.  No anemia.  Patient  given heparin drip and bolus.  Clinical Course as of Apr 16 1711  Sun Apr 15, 2020  1510 ED EKG [WF]  1550 Dr. Karle Starch discussed case with Dr. Stanford Breed with cardiology.  He will be admitted to cardiology floor when bed is available.   [WF]  [id number] Patient was chest pain-free during ED visit until just now.  He has 3/10 pain in his chest that radiates to his left arm.  Will repeat EKG and provide patient with 1 dose of nitroglycerin.   [WF]    Clinical Course User Index [WF] Tedd Sias, PA   Covid swab ordered.  Pending at this time.  Heparin level and repeat CBC ordered.  Chest x-ray reviewed by myself-without any acute abnormality agree with radiologist read.  Reevaluated patient: He is feeling better after nitroglycerin.  Still awaiting bed placement at [facility].   MDM Rules/Calculators/A&P                      Patient care transferred to Dr. Karle Starch at shift change who will follow up on second troponin.  Patient will be admitted to cardiology at [facility].  Final Clinical Impression(s) / ED Diagnoses Final diagnoses:  NSTEMI (non-ST elevated myocardial infarction) [facility])    Rx / DC Orders ED Discharge Orders    None       Tedd Sias, [place] 04/15/20 1713    Truddie Hidden, MD 04/15/20 2208

## 2020-04-15 NOTE — Progress Notes (Signed)
ANTICOAGULATION CONSULT NOTE  Pharmacy Consult for Heparin Indication: chest pain/ACS   Assessment: Pt with left sided chest pain since Friday that radiates left arm and left neck. Troponins elevated. Reviewed home medications and not on any oral anticoagulation. Pharmacy asked to start heparin. Initial heparin level 0.22 units/ml  Goal of Therapy:  Heparin level 0.3-0.7 units/ml Monitor platelets by anticoagulation protocol: Yes   Plan:  Increase heparin drip to 1100 units/hr F/u transfer to [facility]  Thanks for allowing pharmacy to be a part of this patient's care.  Excell Seltzer, PharmD Clinical Pharmacist 04/15/2020,10:40 PM

## 2020-04-15 NOTE — ED Provider Notes (Signed)
Patient seen and examined, agree with assessment and plan by APP. Briefly, patient with HTN, tobacco abuse has had intermittent exertional chest pain for 2 days. Initial EKG with bigeminy and ischemic changes. Repeat shows resolution of bigeminy but worsening T wave inversion. Patient is pain free now. Trop is elevated. ASA and Heparin ordered. Spoke with Dr. Stanford Breed, Cardiologist at [facility] who will accept as a transfer pending bed availability. [facility] aware.    Truddie Hidden, MD 04/15/20 [phone]

## 2020-04-15 NOTE — Progress Notes (Signed)
ANTICOAGULATION CONSULT NOTE - Initial Consult  Pharmacy Consult for Heparin Indication: chest pain/ACS  No Known Allergies  Patient Measurements: Height: 5\' 6"  (167.6 cm) Weight: 71.7 kg (158 lb) IBW/kg (Calculated) : 63.8 HEPARIN DW (KG): 71.7  Vital Signs: Temp: 97.8 F (36.6 C) (05/02 1417) Temp Source: Oral (05/02 1417) BP: 143/97 (05/02 1430) Pulse Rate: 81 (05/02 1430)  Labs: Recent Labs    04/15/20 1427  HGB 16.5  HCT 50.5  PLT 273  CREATININE 0.91  TROPONINIHS 1,134*    Estimated Creatinine Clearance: 76.9 mL/min (by C-G formula based on SCr of 0.91 mg/dL).   Medical History: Past Medical History:  Diagnosis Date  . Anxiety   . 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 Chronic low back pain   . Hypertension     Medications:  See med rec  Assessment: Pt with left sided chest pain since Friday that radiates left arm and left neck. Troponins elevated. Reviewed home medications and not on any oral anticoagulation. Pharmacy asked to start heparin.  Goal of Therapy:  Heparin level 0.3-0.7 units/ml Monitor platelets by anticoagulation protocol: Yes   Plan:  Give 4000 units bolus x 1 Start heparin infusion at 900 units/hr Check anti-Xa level in ~6 hours and daily while on heparin Continue to monitor H&H and platelets  Isac Sarna, BS Vena [place], BCPS Clinical Pharmacist Pager [phone] 04/15/2020,3:45 PM

## 2020-04-15 NOTE — ED Notes (Signed)
Date and time results received: 04/15/20 5:07 PM   Test: troponin Critical Value: 1258  Name of Provider Notified: Ova Freshwater PA  Orders Received? Or Actions Taken?:na

## 2020-04-15 NOTE — ED Notes (Signed)
Lab called and states that they are having to re-run the patient's COVID test due to an issue with the COVID machine.

## 2020-04-16 ENCOUNTER — Encounter (HOSPITAL_COMMUNITY)
Admission: EM | Disposition: A | Discharge status: Home / Self Care | Payer: Self-pay | Source: Home / Self Care | Attending: Cardiovascular Disease

## 2020-04-16 ENCOUNTER — Encounter (HOSPITAL_COMMUNITY): Payer: Self-pay | Admitting: Cardiology

## 2020-04-16 DIAGNOSIS — I214 Non-ST elevation (NSTEMI) myocardial infarction: Principal | ICD-10-CM

## 2020-04-16 DIAGNOSIS — I2510 Atherosclerotic heart disease of native coronary artery without angina pectoris: Secondary | ICD-10-CM

## 2020-04-16 HISTORY — PX: CORONARY STENT INTERVENTION: CATH118234

## 2020-04-16 HISTORY — PX: LEFT HEART CATH AND CORONARY ANGIOGRAPHY: CATH118249

## 2020-04-16 LAB — CBC WITH DIFFERENTIAL/PLATELET
Abs Immature Granulocytes: 0.04 10*3/uL (ref 0.00–0.07)
Basophils Absolute: 0.1 10*3/uL (ref 0.0–0.1)
Basophils Relative: 1 %
Eosinophils Absolute: 0.1 10*3/uL (ref 0.0–0.5)
Eosinophils Relative: 1 %
HCT: 45.8 % (ref 39.0–52.0)
Hemoglobin: 15 g/dL (ref 13.0–17.0)
Immature Granulocytes: 0 %
Lymphocytes Relative: 27 %
Lymphs Abs: 3.3 10*3/uL (ref 0.7–4.0)
MCH: 31.1 pg (ref 26.0–34.0)
MCHC: 32.8 g/dL (ref 30.0–36.0)
MCV: 95 fL (ref 80.0–100.0)
Monocytes Absolute: 0.6 10*3/uL (ref 0.1–1.0)
Monocytes Relative: 5 %
Neutro Abs: 8.1 10*3/uL — ABNORMAL HIGH (ref 1.7–7.7)
Neutrophils Relative %: 66 %
Platelets: 221 10*3/uL (ref 150–400)
RBC: 4.82 MIL/uL (ref 4.22–5.81)
RDW: 13.2 % (ref 11.5–15.5)
WBC: 12.1 10*3/uL — ABNORMAL HIGH (ref 4.0–10.5)
nRBC: 0 % (ref 0.0–0.2)

## 2020-04-16 LAB — BASIC METABOLIC PANEL
Anion gap: 8 (ref 5–15)
BUN: 13 mg/dL (ref 8–23)
CO2: 23 mmol/L (ref 22–32)
Calcium: 8.8 mg/dL — ABNORMAL LOW (ref 8.9–10.3)
Chloride: 107 mmol/L (ref 98–111)
Creatinine, Ser: 0.9 mg/dL (ref 0.61–1.24)
GFR calc Af Amer: >60 mL/min (ref >60)
GFR calc non Af Amer: >60 mL/min (ref >60)
Glucose, Bld: 108 mg/dL — ABNORMAL HIGH (ref 70–99)
Potassium: 3.3 mmol/L — ABNORMAL LOW (ref 3.5–5.1)
Sodium: 138 mmol/L (ref 135–145)

## 2020-04-16 LAB — POCT ACTIVATED CLOTTING TIME
Activated Clotting Time: 301 seconds
Activated Clotting Time: 340 seconds
Activated Clotting Time: 967 seconds

## 2020-04-16 LAB — TROPONIN I (HIGH SENSITIVITY)
Troponin I (High Sensitivity): 698 ng/L (ref <18)
Troponin I (High Sensitivity): 680 ng/L (ref <18)
Troponin I (High Sensitivity): 737 ng/L (ref <18)
Troponin I (High Sensitivity): 898 ng/L (ref <18)
Troponin I (High Sensitivity): 2897 ng/L (ref <18)

## 2020-04-16 LAB — HEPARIN LEVEL (UNFRACTIONATED): Heparin Unfractionated: <0.1 IU/mL — ABNORMAL LOW (ref 0.30–0.70)

## 2020-04-16 LAB — MAGNESIUM: Magnesium: 1.9 mg/dL (ref 1.7–2.4)

## 2020-04-16 LAB — PHOSPHORUS: Phosphorus: 3 mg/dL (ref 2.5–4.6)

## 2020-04-16 SURGERY — LEFT HEART CATH AND CORONARY ANGIOGRAPHY
Anesthesia: LOCAL

## 2020-04-16 MED ORDER — NITROGLYCERIN IN D5W 200-5 MCG/ML-% IV SOLN
0.0000 ug/min | INTRAVENOUS | Status: DC
  Administered 2020-04-16: 30 ug/min via INTRAVENOUS
  Administered 2020-04-16: 25 ug/min via INTRAVENOUS

## 2020-04-16 MED ORDER — MIDAZOLAM HCL 2 MG/2ML IJ SOLN
INTRAMUSCULAR | Status: AC
  Filled 2020-04-16: qty 2

## 2020-04-16 MED ORDER — ATORVASTATIN CALCIUM 80 MG PO TABS
80.0000 mg | ORAL_TABLET | Freq: Every day | ORAL | Status: DC
  Administered 2020-04-17: 80 mg via ORAL
  Filled 2020-04-16: qty 1

## 2020-04-16 MED ORDER — SODIUM CHLORIDE 0.9 % WEIGHT BASED INFUSION
1.0000 mL/kg/h | INTRAVENOUS | Status: DC
Start: 2020-04-17 — End: 2020-04-16

## 2020-04-16 MED ORDER — ACETAMINOPHEN 325 MG PO TABS: 650.0000 mg | ORAL_TABLET | ORAL | Status: DC | PRN

## 2020-04-16 MED ORDER — SODIUM CHLORIDE 0.9 % WEIGHT BASED INFUSION
3.0000 mL/kg/h | INTRAVENOUS | Status: DC
  Administered 2020-04-16: 3 mL/kg/h via INTRAVENOUS

## 2020-04-16 MED ORDER — TICAGRELOR 90 MG PO TABS
90.0000 mg | ORAL_TABLET | Freq: Two times a day (BID) | ORAL | Status: DC
  Administered 2020-04-16: 90 mg via ORAL
  Filled 2020-04-16: qty 1

## 2020-04-16 MED ORDER — HEPARIN BOLUS VIA INFUSION
2000.0000 [IU] | Freq: Once | INTRAVENOUS | Status: AC
  Administered 2020-04-16: 2000 [IU] via INTRAVENOUS

## 2020-04-16 MED ORDER — VERAPAMIL HCL 2.5 MG/ML IV SOLN
INTRAVENOUS | Status: AC
  Filled 2020-04-16: qty 2

## 2020-04-16 MED ORDER — HEPARIN SODIUM (PORCINE) 1000 UNIT/ML IJ SOLN
INTRAMUSCULAR | Status: DC | PRN
  Administered 2020-04-16: 7000 [IU] via INTRAVENOUS
  Administered 2020-04-16: 2000 [IU] via INTRAVENOUS
  Administered 2020-04-16: 4000 [IU] via INTRAVENOUS

## 2020-04-16 MED ORDER — POTASSIUM CHLORIDE CRYS ER 20 MEQ PO TBCR
40.0000 meq | EXTENDED_RELEASE_TABLET | Freq: Once | ORAL | Status: AC
  Administered 2020-04-16: 40 meq via ORAL
  Filled 2020-04-16: qty 2

## 2020-04-16 MED ORDER — SODIUM CHLORIDE 0.9% FLUSH: 3.0000 mL | INTRAVENOUS | Status: DC | PRN

## 2020-04-16 MED ORDER — NITROPRUSSIDE SODIUM-NACL 20-0.9 MG/100ML-% IV SOLN
INTRAVENOUS | Status: AC
  Filled 2020-04-16: qty 100

## 2020-04-16 MED ORDER — METOPROLOL TARTRATE 12.5 MG HALF TABLET: 12.5000 mg | ORAL_TABLET | Freq: Two times a day (BID) | ORAL | Status: DC

## 2020-04-16 MED ORDER — FENTANYL CITRATE (PF) 100 MCG/2ML IJ SOLN
INTRAMUSCULAR | Status: AC
  Filled 2020-04-16: qty 2

## 2020-04-16 MED ORDER — SODIUM CHLORIDE 0.9% FLUSH
3.0000 mL | Freq: Two times a day (BID) | INTRAVENOUS | Status: DC
  Administered 2020-04-17: 3 mL via INTRAVENOUS

## 2020-04-16 MED ORDER — HYDRALAZINE HCL 20 MG/ML IJ SOLN: 10.0000 mg | INTRAMUSCULAR | Status: DC | PRN

## 2020-04-16 MED ORDER — HEPARIN (PORCINE) IN NACL 1000-0.9 UT/500ML-% IV SOLN
INTRAVENOUS | Status: DC | PRN
  Administered 2020-04-16 (×2): 500 mL

## 2020-04-16 MED ORDER — SODIUM CHLORIDE 0.9% FLUSH: 3.0000 mL | Freq: Two times a day (BID) | INTRAVENOUS | Status: DC

## 2020-04-16 MED ORDER — METOPROLOL TARTRATE 12.5 MG HALF TABLET
12.5000 mg | ORAL_TABLET | Freq: Two times a day (BID) | ORAL | Status: DC
  Administered 2020-04-16 – 2020-04-17 (×2): 12.5 mg via ORAL
  Filled 2020-04-16 (×2): qty 1

## 2020-04-16 MED ORDER — NITROGLYCERIN 1 MG/10 ML FOR IR/CATH LAB
INTRA_ARTERIAL | Status: DC | PRN
  Administered 2020-04-16 (×2): 200 ug via INTRACORONARY

## 2020-04-16 MED ORDER — ASPIRIN 81 MG PO CHEW: 81.0000 mg | CHEWABLE_TABLET | ORAL | Status: DC

## 2020-04-16 MED ORDER — ATORVASTATIN CALCIUM 80 MG PO TABS: 80.0000 mg | ORAL_TABLET | Freq: Every day | ORAL | Status: DC

## 2020-04-16 MED ORDER — TICAGRELOR 90 MG PO TABS
ORAL_TABLET | ORAL | Status: AC
  Filled 2020-04-16: qty 2

## 2020-04-16 MED ORDER — SODIUM CHLORIDE 0.9 % WEIGHT BASED INFUSION: 3.0000 mL/kg/h | INTRAVENOUS | Status: DC

## 2020-04-16 MED ORDER — LIDOCAINE HCL (PF) 1 % IJ SOLN
INTRAMUSCULAR | Status: AC
  Filled 2020-04-16: qty 30

## 2020-04-16 MED ORDER — ENOXAPARIN SODIUM 40 MG/0.4ML ~~LOC~~ SOLN: 40.0000 mg | SUBCUTANEOUS | Status: DC

## 2020-04-16 MED ORDER — SODIUM CHLORIDE 0.9 % IV SOLN: 250.0000 mL | INTRAVENOUS | Status: DC | PRN

## 2020-04-16 MED ORDER — HEPARIN (PORCINE) IN NACL 1000-0.9 UT/500ML-% IV SOLN
INTRAVENOUS | Status: AC
  Filled 2020-04-16: qty 1500

## 2020-04-16 MED ORDER — GABAPENTIN 300 MG PO CAPS
300.0000 mg | ORAL_CAPSULE | Freq: Three times a day (TID) | ORAL | Status: DC
  Administered 2020-04-16 – 2020-04-17 (×3): 300 mg via ORAL
  Filled 2020-04-16 (×3): qty 1

## 2020-04-16 MED ORDER — SODIUM CHLORIDE 0.9 % IV SOLN: INTRAVENOUS | Status: DC

## 2020-04-16 MED ORDER — OXYCODONE HCL 5 MG PO TABS
15.0000 mg | ORAL_TABLET | ORAL | Status: DC | PRN
  Administered 2020-04-16 – 2020-04-17 (×4): 15 mg via ORAL
  Filled 2020-04-16 (×4): qty 3

## 2020-04-16 MED ORDER — MIDAZOLAM HCL 2 MG/2ML IJ SOLN
INTRAMUSCULAR | Status: DC | PRN
  Administered 2020-04-16 (×4): 1 mg via INTRAVENOUS

## 2020-04-16 MED ORDER — TICAGRELOR 90 MG PO TABS
ORAL_TABLET | ORAL | Status: DC | PRN
  Administered 2020-04-16: 180 mg via ORAL

## 2020-04-16 MED ORDER — ASPIRIN 81 MG PO CHEW
81.0000 mg | CHEWABLE_TABLET | Freq: Every day | ORAL | Status: DC
  Administered 2020-04-17: 81 mg via ORAL
  Filled 2020-04-16: qty 1

## 2020-04-16 MED ORDER — MORPHINE SULFATE (PF) 2 MG/ML IV SOLN
2.0000 mg | Freq: Once | INTRAVENOUS | Status: AC
  Administered 2020-04-16: 2 mg via INTRAVENOUS
  Filled 2020-04-16: qty 1

## 2020-04-16 MED ORDER — FENTANYL CITRATE (PF) 100 MCG/2ML IJ SOLN
INTRAMUSCULAR | Status: DC | PRN
  Administered 2020-04-16 (×2): 25 ug via INTRAVENOUS
  Administered 2020-04-16: 50 ug via INTRAVENOUS
  Administered 2020-04-16 (×2): 25 ug via INTRAVENOUS

## 2020-04-16 MED ORDER — OXYCODONE HCL 5 MG PO TABS
10.0000 mg | ORAL_TABLET | Freq: Once | ORAL | Status: AC
  Administered 2020-04-16: 10 mg via ORAL
  Filled 2020-04-16: qty 2

## 2020-04-16 MED ORDER — LABETALOL HCL 5 MG/ML IV SOLN: 10.0000 mg | INTRAVENOUS | Status: DC | PRN

## 2020-04-16 MED ORDER — HEPARIN SODIUM (PORCINE) 1000 UNIT/ML IJ SOLN
INTRAMUSCULAR | Status: AC
  Filled 2020-04-16: qty 1

## 2020-04-16 MED ORDER — HEPARIN (PORCINE) 25000 UT/250ML-% IV SOLN
1300.0000 [IU]/h | INTRAVENOUS | Status: DC
  Administered 2020-04-16: 1300 [IU]/h via INTRAVENOUS
  Filled 2020-04-16: qty 250

## 2020-04-16 MED ORDER — METHOCARBAMOL 500 MG PO TABS
750.0000 mg | ORAL_TABLET | Freq: Three times a day (TID) | ORAL | Status: DC
  Administered 2020-04-16 – 2020-04-17 (×3): 750 mg via ORAL
  Filled 2020-04-16 (×3): qty 2

## 2020-04-16 MED ORDER — VERAPAMIL HCL 2.5 MG/ML IV SOLN
INTRAVENOUS | Status: DC | PRN
  Administered 2020-04-16 (×2): 10 mL via INTRA_ARTERIAL

## 2020-04-16 MED ORDER — NITROGLYCERIN IN D5W 200-5 MCG/ML-% IV SOLN
INTRAVENOUS | Status: AC | PRN
  Administered 2020-04-16: 10 ug/min via INTRAVENOUS

## 2020-04-16 MED ORDER — ASPIRIN 81 MG PO CHEW
81.0000 mg | CHEWABLE_TABLET | ORAL | Status: DC
Start: 2020-04-17 — End: 2020-04-16

## 2020-04-16 MED ORDER — ONDANSETRON HCL 4 MG/2ML IJ SOLN: 4.0000 mg | Freq: Four times a day (QID) | INTRAMUSCULAR | Status: DC | PRN

## 2020-04-16 MED ORDER — NITROGLYCERIN 0.4 MG SL SUBL
0.4000 mg | SUBLINGUAL_TABLET | SUBLINGUAL | Status: DC | PRN
  Administered 2020-04-16: 0.4 mg via SUBLINGUAL
  Filled 2020-04-16: qty 1

## 2020-04-16 MED ORDER — ASPIRIN 81 MG PO CHEW: 81.0000 mg | CHEWABLE_TABLET | Freq: Every day | ORAL | Status: DC

## 2020-04-16 MED ORDER — ATORVASTATIN CALCIUM 80 MG PO TABS
80.0000 mg | ORAL_TABLET | Freq: Every day | ORAL | Status: DC
  Administered 2020-04-16: 80 mg via ORAL
  Filled 2020-04-16: qty 2

## 2020-04-16 MED ORDER — ASPIRIN EC 81 MG PO TBEC
81.0000 mg | DELAYED_RELEASE_TABLET | Freq: Once | ORAL | Status: AC
  Administered 2020-04-16: 81 mg via ORAL
  Filled 2020-04-16: qty 1

## 2020-04-16 MED ORDER — LIDOCAINE HCL (PF) 1 % IJ SOLN
INTRAMUSCULAR | Status: DC | PRN
  Administered 2020-04-16: 2 mL

## 2020-04-16 MED ORDER — SODIUM CHLORIDE 0.9 % WEIGHT BASED INFUSION: 1.0000 mL/kg/h | INTRAVENOUS | Status: DC

## 2020-04-16 MED ORDER — NITROGLYCERIN IN D5W 200-5 MCG/ML-% IV SOLN
INTRAVENOUS | Status: AC
  Filled 2020-04-16: qty 250

## 2020-04-16 MED ORDER — NITROGLYCERIN 1 MG/10 ML FOR IR/CATH LAB
INTRA_ARTERIAL | Status: AC
  Filled 2020-04-16: qty 10

## 2020-04-16 SURGICAL SUPPLY — 21 items
BALLN SAPPHIRE 2.0X12 (BALLOONS) ×2
BALLN SAPPHIRE ~~LOC~~ 3.0X18 (BALLOONS) ×1 IMPLANT
BALLOON SAPPHIRE 2.0X12 (BALLOONS) IMPLANT
CATH 5FR JL3.5 JR4 ANG PIG MP (CATHETERS) ×1 IMPLANT
CATH INFINITI 5FR JL4 (CATHETERS) ×1 IMPLANT
CATH LAUNCHER 6FR EBU3.5 (CATHETERS) ×1 IMPLANT
CATH VISTA GUIDE 6FR XBLAD3.5 (CATHETERS) ×1 IMPLANT
DEVICE RAD COMP TR BAND LRG (VASCULAR PRODUCTS) ×1 IMPLANT
ELECT DEFIB PAD ADLT CADENCE (PAD) ×1 IMPLANT
GLIDESHEATH SLEND SS 6F .021 (SHEATH) ×1 IMPLANT
GUIDEWIRE INQWIRE 1.5J.035X260 (WIRE) IMPLANT
INQWIRE 1.5J .035X260CM (WIRE) ×2
KIT ENCORE 26 ADVANTAGE (KITS) ×1 IMPLANT
KIT HEART LEFT (KITS) ×2 IMPLANT
PACK CARDIAC CATHETERIZATION (CUSTOM PROCEDURE TRAY) ×2 IMPLANT
SHEATH PROBE COVER 6X72 (BAG) ×1 IMPLANT
STENT RESOLUTE ONYX 2.75X38 (Permanent Stent) ×1 IMPLANT
STENT RESOLUTE ONYX 3.0X12 (Permanent Stent) ×1 IMPLANT
TRANSDUCER W/STOPCOCK (MISCELLANEOUS) ×2 IMPLANT
TUBING CIL FLEX 10 FLL-RA (TUBING) ×2 IMPLANT
WIRE COUGAR XT STRL 190CM (WIRE) ×1 IMPLANT

## 2020-04-16 NOTE — Care Management (Signed)
04-16-20 Benefits check submitted for [employer]. Case Manager will follow for cost. Graves-Bigelow, Ocie Cornfield, RN,BSN Case Manager

## 2020-04-16 NOTE — Interval H&P Note (Signed)
Cath Lab Visit (complete for each Cath Lab visit)  Clinical Evaluation Leading to the Procedure:   ACS: Yes.    Non-ACS:    Anginal Classification: CCS IV  Anti-ischemic medical therapy: Maximal Therapy (2 or more classes of medications)  Non-Invasive Test Results: No non-invasive testing performed  Prior CABG: No previous CABG      History and Physical Interval Note:  04/16/2020 10:29 AM  Jim Martin  has presented today for surgery, with the diagnosis of nstemi.  The various methods of treatment have been discussed with the patient and family. After consideration of risks, benefits and other options for treatment, the patient has consented to  Procedure(s): LEFT HEART CATH AND CORONARY ANGIOGRAPHY (N/A) as a surgical intervention.  The patient's history has been reviewed, patient examined, no change in status, stable for surgery.  I have reviewed the patient's chart and labs.  Questions were answered to the patient's satisfaction.     Shelva Majestic

## 2020-04-16 NOTE — Progress Notes (Addendum)
Patient called at 1800 with complaint of chest pain, mid chest, sore "like a bruise', rated 5/10. When he had the heart attack he described the pain as feeling like someone was stepping on his chest, severe pressure, 10/10. Increased nitroglycerin infusion to 25 mcg from 20 mcg at 1805. Stated soreness now 3/10 at 1810. No change after 10 minutes, 1820 increased to 30 mcg.  12 lead ECG completed and Morphine IV given without any change in pain. Notified Cecilie Kicks NP.  1910 Report to [facility] RN. Cecilie Kicks NP into see patient, Nitroglycerin infusion increased. Cecilie Kicks will review medications and make adjustments.

## 2020-04-16 NOTE — Interval H&P Note (Signed)
History and Physical Interval Note:  04/16/2020 10:29 AM  Jim Martin  has presented today for surgery, with the diagnosis of nstemi.  The various methods of treatment have been discussed with the patient and family. After consideration of risks, benefits and other options for treatment, the patient has consented to  Procedure(s): LEFT HEART CATH AND CORONARY ANGIOGRAPHY (N/A) as a surgical intervention.  The patient's history has been reviewed, patient examined, no change in status, stable for surgery.  I have reviewed the patient's chart and labs.  Questions were answered to the patient's satisfaction.     Shelva Majestic

## 2020-04-16 NOTE — Progress Notes (Signed)
ANTICOAGULATION CONSULT NOTE  Pharmacy Consult for Heparin Indication: chest pain/ACS   Assessment: Pt with left sided chest pain since Friday that radiates left arm and left neck. Troponins elevated. Reviewed home medications and not on any oral anticoagulation. Pharmacy asked to start heparin. Heparin level this am <0.1, no issues with infusion  Goal of Therapy:  Heparin level 0.3-0.7 units/ml Monitor platelets by anticoagulation protocol: Yes   Plan:  Bolus 2000 units heparin Increase heparin drip to 1300 units/hr F/u transfer to [facility]  Thanks for allowing pharmacy to be a part of this patient's care.  Excell Seltzer, PharmD Clinical Pharmacist 04/16/2020,6:39 AM

## 2020-04-16 NOTE — TOC Benefit Eligibility Note (Signed)
Transition of Care [facility]) Benefit Eligibility Note    Patient Details  Name: Jim Martin MRN: [id number] Date of Birth: [birth year]   Medication/Dose: BRILINTA   90 MG BID  Covered?: Yes  Tier: 3 Drug  Prescription Coverage Preferred Pharmacy: [facility] with Person/Company/Phone Number:: JENNIFER @ [employer] [id number] # [phone]  Co-Pay: $ 40.00  Prior Approval: No  Deductible: Donnie [place] Phone Number: 04/16/2020, 3:20 PM

## 2020-04-16 NOTE — ED Notes (Signed)
No c/o chest pain.  SBP 98 hold ntg

## 2020-04-16 NOTE — Progress Notes (Addendum)
Developed chest pain after arriving back to floor NTG drip increased and Morphine given.  EKG with T wave inversions in V2 this was present prior to procedure.  No ST elevation.   Will repeat troponins. Last one at 1631   NTG has helped the pain the most and BP is still elevated.    Will give lopressor now as well, none today.  He tells me not as severe as admit pain.  Discussed with Dr. Domenic Polite and plan will be to resume IV heparin 6 hours post sheath removal.   Increase IV NTG to 40   morphine did help his back pain.

## 2020-04-16 NOTE — H&P (Signed)
Cardiology Admission History and Physical:   Patient ID: Jim Martin; [id number]; 03-23-1956   Admission date: 04/15/2020  Primary Care Provider: Nolene Ebbs, MD Primary Cardiologist: Carlyle Dolly, MD Primary Electrophysiologist: None  Chief Complaint: Chest and left arm pain  Patient Profile:   Jim Martin is a 64 y.o. male with a history of essential hypertension, chronic lower back pain, migraines and anxiety.  History of Present Illness:   Mr. Wetherby states that he experienced mid scapular upper back discomfort while talking to a friend on the phone on Friday.  Symptoms moved into his chest and left arm, describes it as a  "sharp" discomfort.  This resolved after several minutes and on Saturday he states that he felt washed out, did not do very much during the day.  On Sunday he got up to go to church and had recurring discomfort in his chest and left arm prompting evaluation in the [facility].  He does not report any recent exertional symptoms, states that he has a history of reflux but that the symptoms were worse.  Evaluation in the ER includes peak high-sensitivity troponin I of 1258, ECG with deep anterior/anterolateral T wave inversions following initial tracing with ventricular bigeminy, all new in comparison to previous tracings from 2017.  He has been treated with aspirin, heparin infusion, and has nitroglycerin by ER staff.  States that he is generally comfortable at rest, however has recurring symptoms when he is out of bed to use the bathroom.  Record review finds previous cardiology evaluation by Dr. Harl Bowie in 2017 for chest discomfort.  Lexiscan Cardiolite at that time was low risk with LVEF 55 to 65% and suspected diaphragmatic attenuation.   Past Medical History:  Diagnosis Date  . Anxiety   . 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 Chronic low back pain   . Hypertension     Past Surgical History:  Procedure Laterality Date  . BRAIN SURGERY    .  COLONOSCOPY  11/2009   Dr. Oletta Lamas: medium-sized internal hemorrhoids, normal   . Lumbosacral spine surgery    . SPINAL CORD STIMULATOR IMPLANT       Medications Prior to Admission: Prior to Admission medications   Medication Sig Start Date End Date Taking? Authorizing Provider  amLODipine (NORVASC) 10 MG tablet Take 10 mg by mouth daily. 09/16/13  Yes [provider]  aspirin EC 81 MG tablet Take 81 mg by mouth daily.   Yes [provider]  DULoxetine (CYMBALTA) 30 MG capsule Take 30 mg by mouth daily. 04/03/20  Yes [provider]  famotidine (PEPCID) 20 MG tablet Take 1 tablet (20 mg total) by mouth 2 (two) times daily. 11/11/16  Yes Noemi Chapel, MD  folic acid (FOLVITE) 1 MG tablet Take 1 mg by mouth daily. 04/03/20  Yes [provider]  gabapentin (NEURONTIN) 300 MG capsule Take 300 mg by mouth 3 (three) times daily. 04/05/20  Yes [provider]  methocarbamol (ROBAXIN) 750 MG tablet Take 750 mg by mouth 3 (three) times daily.   Yes [provider]  nabumetone (RELAFEN) 750 MG tablet Take 750 mg by mouth 2 (two) times daily.   Yes [provider]  oxyCODONE (ROXICODONE) 15 MG immediate release tablet Take 15 mg by mouth 5 (five) times daily.  02/06/15  Yes [provider]  pantoprazole (PROTONIX) 40 MG tablet Take 40 mg by mouth daily. 04/03/20  Yes [provider]  topiramate (TOPAMAX) 100 MG tablet TAKE ONE TABLET  BY MOUTH AT BEDTIME. 11/02/19  Yes Kathrynn Ducking, MD  zolpidem (AMBIEN) 10 MG tablet Take 10 mg by mouth at bedtime as needed for sleep.   Yes [provider]     Allergies:   No Known Allergies  Social History:   Social History   Socioeconomic History  . Marital status: Divorced    Spouse name: Not on file  . Number of children: 1  . Years of education: 8th  . Highest education level: Not on file  Occupational History  . Occupation: disabilty  Tobacco Use  . Smoking  status: Current Every Day Smoker    Packs/day: 1.50    Types: Cigarettes  . Smokeless tobacco: Never Used  Substance and Sexual Activity  . Alcohol use: No    Alcohol/week: 0.0 standard drinks  . Drug use: No  . Sexual activity: Not on file  Other Topics Concern  . Not on file  Social History Narrative   Patient is single with one child.   Patient is left handed.   Patient has an 8 th grade education.   Patient drinks 5 or more sodas daily.   Social Determinants of Health   Financial Resource Strain:   . Difficulty of Paying Living Expenses:   Food Insecurity:   . Worried About Charity fundraiser in the Last Year:   . Arboriculturist in the Last Year:   Transportation Needs:   . Film/video editor (Medical):   Marland Kitchen Lack of Transportation (Non-Medical):   Physical Activity:   . Days of Exercise per Week:   . Minutes of Exercise per Session:   Stress:   . Feeling of Stress :   Social Connections:   . Frequency of Communication with Friends and Family:   . Frequency of Social Gatherings with Friends and Family:   . Attends Religious Services:   . Active Member of Clubs or Organizations:   . Attends Archivist Meetings:   Marland Kitchen Marital Status:   Intimate Partner Violence:   . Fear of Current or Ex-Partner:   . Emotionally Abused:   Marland Kitchen Physically Abused:   . Sexually Abused:     Family History:  The patient's family history includes Cancer in his mother; Heart attack in his brother and father. There is no history of Colon cancer.    ROS:  Please see the history of present illness.  Chronic lower back pain, follows in a pain clinic in [place].  All other ROS reviewed and negative.     Physical Exam/Data:   Vitals:   04/16/20 0640 04/16/20 0650 04/16/20 0700 04/16/20 0710  BP: 135/88 112/86 116/77 130/81  Pulse: 75 (!) 59 61 62  Resp: 14 14 10 12   Temp:    98.2 F (36.8 C)  TempSrc:    Oral  SpO2: 94% 94% 94% 95%  Weight:      Height:       No  intake or output data in the 24 hours ending 04/16/20 0817 Filed Weights   04/15/20 1416  Weight: 71.7 kg   Body mass index is 25.5 kg/m.   Gen: Patient is in no acute distress. HEENT: Conjunctiva and lids normal, oropharynx clear with moist mucosa. Neck: Supple, no elevated JVP or carotid bruits, no thyromegaly. Lungs: Clear to auscultation, nonlabored breathing at rest. Cardiac: Regular rate and rhythm, no S3, soft systolic murmur, no pericardial rub. Abdomen: Soft, nontender, bowel sounds present. Extremities: No pitting edema, distal pulses  2+. Skin: Warm and dry. Musculoskeletal: No kyphosis. Neuropsychiatric: Alert and oriented x3, affect grossly appropriate.   EKG:  An ECG dated 04/16/2020 was personally reviewed today and demonstrated:  Sinus rhythm with deep anterior/anterolateral T wave inversions, also to lesser degree in the inferior leads.  This is in the absence of chest pain.  Relevant CV Studies:  Lexiscan Cardiolite 01/04/2016:  There was no ST segment deviation noted during stress.  This is a low risk study.  The left ventricular ejection fraction is normal (55-65%).  Inferior defect likely secondary to subdiaphragmatic attenuation and gut radiotracer uptake artifact. Cannot rule prior inferior infarct. No evidence of any myocardium currently at jeopardy. Overall findings remain low risk.  Laboratory Data:  Chemistry Recent Labs  Lab 04/15/20 1427 04/16/20 0521  NA 137 138  K 3.4* 3.3*  CL 106 107  CO2 21* 23  GLUCOSE 133* 108*  BUN 12 13  CREATININE 0.91 0.90  CALCIUM 9.2 8.8*  GFRNONAA >60 >60  GFRAA >60 >60  ANIONGAP 10 8    No results for input(s): PROT, ALBUMIN, AST, ALT, ALKPHOS, BILITOT in the last 168 hours. Hematology Recent Labs  Lab 04/15/20 1427 04/16/20 0521  WBC 13.4* 12.1*  RBC 5.27 4.82  HGB 16.5 15.0  HCT 50.5 45.8  MCV 95.8 95.0  MCH 31.3 31.1  MCHC 32.7 32.8  RDW 13.2 13.2  PLT 273 221   Cardiac Enzymes Recent  Labs  Lab 04/15/20 1427 04/15/20 1621 04/15/20 2027 04/16/20 0521 04/16/20 0725  TROPONINIHS 1,134* 1,258* 1,021* 698* 680*    Radiology/Studies:  DG Chest Portable 1 View  Result Date: 04/15/2020 CLINICAL DATA:  Pt with left sided cp since Friday that radiates left arm and left neck. EXAM: PORTABLE CHEST 1 VIEW COMPARISON:  Chest radiograph 11/10/2016 FINDINGS: Stable cardiomediastinal contours. A spinal stimulator projects over the lower mediastinum. The lungs are clear. No pneumothorax or significant pleural effusion. No acute finding in the visualized skeleton. IMPRESSION: No acute cardiopulmonary finding. Electronically Signed   By: Audie Pinto M.D.   On: 04/15/2020 14:43    Assessment and Plan:   1.  NSTEMI with recurring chest and left arm discomfort since this past Friday.  Peak high-sensitivity troponin I 1258 now down to 608.  ECG shows deep anterior/anterolateral T wave inversions, also noted to a lesser degree in the inferior leads and new in comparison to previous tracing from 2017 concerning for ischemia.  No active neurological symptoms to suspect CNS event.  He is hemodynamically stable at this time.  Chest x-ray without pulmonary edema. TIMI risk score is 5.  2.  Tobacco abuse.  3.  Essential hypertension, on Norvasc as an outpatient.  4.  Chronic back pain, follows in a pain management clinic.  He is on Neurontin, Relafen, Robaxin, and Roxicodone as an outpatient.  5.  Uncertain lipid status.  Patient seen and examined this morning, discussed with nursing, I reviewed the chart.  Case discussed between ER and Dr. Stanford Breed yesterday who accepted patient in transfer, however no beds have been available as yet.  We contacted our team in [facility] and will have the patient taken directly to the cardiac catheterization lab and then admitted from there.  Continue aspirin and heparin, give high-dose statin and replete potassium.  Depending on blood pressure and heart rate,  can add low-dose beta-blocker.  Signed, Rozann Lesches, MD  04/16/2020 8:17 AM

## 2020-04-16 NOTE — Progress Notes (Signed)
ANTICOAGULATION CONSULT NOTE - Follow Up Consult  Pharmacy Consult for Heparin Indication: ACS/STEMI  No Known Allergies  Patient Measurements: Height: 5\' 6"  (167.6 cm) Weight: 69.3 kg (152 lb 12.5 oz) IBW/kg (Calculated) : 63.8 Heparin Dosing Weight: 69.3 kg  Vital Signs: Temp: 97.8 F (36.6 C) (05/03 1300) Temp Source: Oral (05/03 1300) BP: 135/92 (05/03 1840) Pulse Rate: 80 (05/03 1840)  Labs: Recent Labs    04/15/20 1427 04/15/20 1621 04/15/20 2027 04/15/20 2205 04/16/20 0521 04/16/20 0521 04/16/20 0522 04/16/20 0725 04/16/20 1328 04/16/20 1520  HGB 16.5  --   --   --  15.0  --   --   --   --   --   HCT 50.5  --   --   --  45.8  --   --   --   --   --   PLT 273  --   --   --  221  --   --   --   --   --   HEPARINUNFRC  --   --   --  0.22*  --   --  <0.10*  --   --   --   CREATININE 0.91  --   --   --  0.90  --   --   --   --   --   TROPONINIHS 1,134*   < >   < >  --  698*   < >  --  680* 737* 898*   < > = values in this interval not displayed.    Estimated Creatinine Clearance: 77.8 mL/min (by C-G formula based on SCr of 0.9 mg/dL).   Medications:  Scheduled:  . [START ON 04/17/2020] aspirin  81 mg Oral Daily  . [START ON 04/17/2020] atorvastatin  80 mg Oral Daily  . gabapentin  300 mg Oral TID  . methocarbamol  750 mg Oral TID  . metoprolol tartrate  12.5 mg Oral BID  . sodium chloride flush  3 mL Intravenous Once  . sodium chloride flush  3 mL Intravenous Q12H  . sodium chloride flush  3 mL Intravenous Q12H  . ticagrelor  90 mg Oral BID   Infusions:  . sodium chloride    . sodium chloride 125 mL/hr at 04/16/20 1811  . sodium chloride    . nitroGLYCERIN 30 mcg/min (04/16/20 1820)   PRN: sodium chloride, sodium chloride, acetaminophen, nitroGLYCERIN, ondansetron (ZOFRAN) IV, oxyCODONE, sodium chloride flush, sodium chloride flush  Assessment: Pt with left sided chest pain since Friday that radiates left arm and left neck. Troponins elevated. Reviewed  home medications and not on any oral anticoagulation. Pharmacy asked to start heparin.    Patient went to the cath lab today for PCI w/ DES to LAD, ticagrelor was started and heparin was discontinued.  This evening, patient developed chest pain and pressure, EKG with T wave inversions in V2 this was present prior to procedure.  No ST elevation.  Pharmacy re-consulted to dose heparin 6hr after sheath pulled (12:18 per cath report).  Goal of Therapy:  Heparin level 0.3-0.7 units/ml Monitor platelets by anticoagulation protocol: Yes   Plan:   Start heparin IV infusion 1300 units/hr  Check heparin level in 6hrs  Daily heparin level and CBC  Peggyann Juba, PharmD, BCPS Pharmacy: [phone] 04/16/2020,7:33 PM

## 2020-04-17 ENCOUNTER — Inpatient Hospital Stay (HOSPITAL_COMMUNITY): Payer: PPO

## 2020-04-17 ENCOUNTER — Encounter (HOSPITAL_COMMUNITY): Payer: Self-pay | Admitting: Cardiovascular Disease

## 2020-04-17 DIAGNOSIS — I513 Intracardiac thrombosis, not elsewhere classified: Secondary | ICD-10-CM

## 2020-04-17 DIAGNOSIS — Z720 Tobacco use: Secondary | ICD-10-CM

## 2020-04-17 DIAGNOSIS — I248 Other forms of acute ischemic heart disease: Secondary | ICD-10-CM

## 2020-04-17 DIAGNOSIS — I10 Essential (primary) hypertension: Secondary | ICD-10-CM

## 2020-04-17 DIAGNOSIS — E782 Mixed hyperlipidemia: Secondary | ICD-10-CM

## 2020-04-17 DIAGNOSIS — E785 Hyperlipidemia, unspecified: Secondary | ICD-10-CM

## 2020-04-17 DIAGNOSIS — I5041 Acute combined systolic (congestive) and diastolic (congestive) heart failure: Secondary | ICD-10-CM

## 2020-04-17 LAB — CBC
HCT: 39.2 % (ref 39.0–52.0)
Hemoglobin: 13 g/dL (ref 13.0–17.0)
MCH: 31.5 pg (ref 26.0–34.0)
MCHC: 33.2 g/dL (ref 30.0–36.0)
MCV: 94.9 fL (ref 80.0–100.0)
Platelets: 233 10*3/uL (ref 150–400)
RBC: 4.13 MIL/uL — ABNORMAL LOW (ref 4.22–5.81)
RDW: 13.2 % (ref 11.5–15.5)
WBC: 12.2 10*3/uL — ABNORMAL HIGH (ref 4.0–10.5)
nRBC: 0 % (ref 0.0–0.2)

## 2020-04-17 LAB — LIPID PANEL
Cholesterol: 144 mg/dL (ref 0–200)
HDL: 28 mg/dL — ABNORMAL LOW (ref >40)
LDL Cholesterol: 97 mg/dL (ref 0–99)
Total CHOL/HDL Ratio: 5.1 RATIO
Triglycerides: 93 mg/dL (ref <150)
VLDL: 19 mg/dL (ref 0–40)

## 2020-04-17 LAB — HEPATIC FUNCTION PANEL
ALT: 17 U/L (ref 0–44)
AST: 36 U/L (ref 15–41)
Albumin: 3 g/dL — ABNORMAL LOW (ref 3.5–5.0)
Alkaline Phosphatase: 74 U/L (ref 38–126)
Bilirubin, Direct: 0.2 mg/dL (ref 0.0–0.2)
Indirect Bilirubin: 0.5 mg/dL (ref 0.3–0.9)
Total Bilirubin: 0.7 mg/dL (ref 0.3–1.2)
Total Protein: 5.4 g/dL — ABNORMAL LOW (ref 6.5–8.1)

## 2020-04-17 LAB — BASIC METABOLIC PANEL
Anion gap: 9 (ref 5–15)
BUN: 10 mg/dL (ref 8–23)
CO2: 20 mmol/L — ABNORMAL LOW (ref 22–32)
Calcium: 8.7 mg/dL — ABNORMAL LOW (ref 8.9–10.3)
Chloride: 109 mmol/L (ref 98–111)
Creatinine, Ser: 0.84 mg/dL (ref 0.61–1.24)
GFR calc Af Amer: >60 mL/min (ref >60)
GFR calc non Af Amer: >60 mL/min (ref >60)
Glucose, Bld: 106 mg/dL — ABNORMAL HIGH (ref 70–99)
Potassium: 3.6 mmol/L (ref 3.5–5.1)
Sodium: 138 mmol/L (ref 135–145)

## 2020-04-17 LAB — TROPONIN I (HIGH SENSITIVITY): Troponin I (High Sensitivity): 5179 ng/L (ref <18)

## 2020-04-17 LAB — HEMOGLOBIN A1C
Hgb A1c MFr Bld: 5.2 % (ref 4.8–5.6)
Mean Plasma Glucose: 102.54 mg/dL

## 2020-04-17 LAB — ECHOCARDIOGRAM COMPLETE
Height: 66 in
Weight: 2474.8 oz

## 2020-04-17 LAB — HEPARIN LEVEL (UNFRACTIONATED): Heparin Unfractionated: 0.32 IU/mL (ref 0.30–0.70)

## 2020-04-17 MED ORDER — CLOPIDOGREL BISULFATE 75 MG PO TABS
300.0000 mg | ORAL_TABLET | Freq: Once | ORAL | Status: AC
  Administered 2020-04-17: 300 mg via ORAL
  Filled 2020-04-17: qty 4

## 2020-04-17 MED ORDER — APIXABAN 5 MG PO TABS: 5.0000 mg | ORAL_TABLET | Freq: Two times a day (BID) | ORAL | 3 refills | Status: DC

## 2020-04-17 MED ORDER — DULOXETINE HCL 30 MG PO CPEP
30.0000 mg | ORAL_CAPSULE | Freq: Every day | ORAL | Status: DC
  Administered 2020-04-17: 30 mg via ORAL
  Filled 2020-04-17: qty 1

## 2020-04-17 MED ORDER — PERFLUTREN LIPID MICROSPHERE
1.0000 mL | INTRAVENOUS | Status: AC | PRN
  Filled 2020-04-17: qty 10

## 2020-04-17 MED ORDER — CLOPIDOGREL BISULFATE 75 MG PO TABS: 75.0000 mg | ORAL_TABLET | Freq: Every day | ORAL | Status: DC

## 2020-04-17 MED ORDER — POTASSIUM CHLORIDE CRYS ER 20 MEQ PO TBCR
40.0000 meq | EXTENDED_RELEASE_TABLET | Freq: Once | ORAL | Status: AC
  Administered 2020-04-17: 40 meq via ORAL
  Filled 2020-04-17: qty 2

## 2020-04-17 MED ORDER — NITROGLYCERIN 0.4 MG SL SUBL: 0.4000 mg | SUBLINGUAL_TABLET | SUBLINGUAL | 3 refills | Status: AC | PRN

## 2020-04-17 MED ORDER — LIVING BETTER WITH HEART FAILURE BOOK: Freq: Once | Status: AC

## 2020-04-17 MED ORDER — APIXABAN 5 MG PO TABS
5.0000 mg | ORAL_TABLET | Freq: Two times a day (BID) | ORAL | Status: DC
  Administered 2020-04-17: 5 mg via ORAL
  Filled 2020-04-17: qty 1

## 2020-04-17 MED ORDER — METOPROLOL SUCCINATE ER 25 MG PO TB24: 25.0000 mg | ORAL_TABLET | Freq: Every day | ORAL | 3 refills | Status: DC

## 2020-04-17 MED ORDER — METOPROLOL SUCCINATE ER 25 MG PO TB24: 25.0000 mg | ORAL_TABLET | Freq: Every day | ORAL | Status: DC

## 2020-04-17 MED ORDER — CLOPIDOGREL BISULFATE 75 MG PO TABS: 75.0000 mg | ORAL_TABLET | Freq: Every day | ORAL | 3 refills | Status: DC

## 2020-04-17 MED ORDER — PERFLUTREN LIPID MICROSPHERE
INTRAVENOUS | Status: AC
  Administered 2020-04-17: 5 mL via INTRAVENOUS
  Filled 2020-04-17: qty 10

## 2020-04-17 MED ORDER — TOPIRAMATE 100 MG PO TABS: 100.0000 mg | ORAL_TABLET | Freq: Every day | ORAL | Status: DC

## 2020-04-17 MED ORDER — ATORVASTATIN CALCIUM 80 MG PO TABS: 80.0000 mg | ORAL_TABLET | Freq: Every day | ORAL | 3 refills | Status: DC

## 2020-04-17 MED FILL — NITROGLYCERIN 0.4 MG TAB SL: 0.4 | 7 days supply | Qty: 25 | Fill #0

## 2020-04-17 MED FILL — CLOPIDOGREL 75 MG TABLET: 75 | 90 days supply | Qty: 90 | Fill #0

## 2020-04-17 MED FILL — METOPROLOL SUCCINATE ER 25: 25 | 90 days supply | Qty: 90 | Fill #0

## 2020-04-17 MED FILL — ELIQUIS 5 MG TABLET: 5 | 30 days supply | Qty: 60 | Fill #0

## 2020-04-17 MED FILL — ATORVASTATIN CALCIUM 80 MG: 80 | 90 days supply | Qty: 90 | Fill #0

## 2020-04-17 NOTE — TOC Benefit Eligibility Note (Signed)
Transition of Care [facility]) Benefit Eligibility Note    Patient Details  Name: JEANMICHEL FORSELL MRN: [id number] Date of Birth: 10-Jul-1956   Medication/Dose: Arne Cleveland  5 MG BID  Covered?: Yes  Tier: 3 Drug  Prescription Coverage Preferred Pharmacy: [facility] with Person/Company/Phone Number:: JENNIFER @   [employer] [id number] # [phone]  Co-Pay: $ 40.00  Prior Approval: No  Deductible: Unmet(OUT-OF-POCKET:UNMET)       Memory Argue Phone Number: 04/17/2020, 9:52 AM

## 2020-04-17 NOTE — Progress Notes (Signed)
  Echocardiogram 2D Echocardiogram has been performed.  Jim Martin 04/17/2020, 9:13 AM

## 2020-04-17 NOTE — Progress Notes (Signed)
[id number] Held ambulation after talking with pt's RN. MI education completed with pt and daughter who voiced understanding. Stressed importance of brilinta with stent. Reviewed NTG use, MI restrictions, heart healthy food choices, smoking cessation, walking for ex, and CRP 2. Pt quit smoking once for about 15 years. Gave smoking cessation handout and encouraged to call 1800quitnow if needed. Pt used hard candies to help quit last time. Discussed referral to [facility] CRP 2. Pt has back issues that might affect participation of program.  Graylon Good RN BSN 04/17/2020 10:12 AM

## 2020-04-17 NOTE — Discharge Instructions (Signed)
PLEASE REMEMBER TO BRING ALL OF YOUR MEDICATIONS TO EACH OF YOUR FOLLOW-UP OFFICE VISITS.  PLEASE ATTEND ALL SCHEDULED FOLLOW-UP APPOINTMENTS.   Activity: Increase activity slowly as tolerated. You may shower, but no soaking baths (or swimming) for 1 week. No driving for 24 hours. No lifting over 5 lbs for 1 week. No sexual activity for 1 week.   You May Return to Work: in 1 week (if applicable)  Wound Care: You may wash cath site gently with soap and water. Keep cath site clean and dry. If you notice pain, swelling, bleeding or pus at your cath site, please call [phone].  ________________________________________________________________________ Heart Failure Education: 1. Weigh yourself EVERY morning after you go to the bathroom but before you eat or drink anything. Write this number down in a weight log/diary. If you gain 3 pounds overnight or 5 pounds in a week, call the office. 2. Take your medicines as prescribed. If you have concerns about your medications, please call us before you stop taking them.  3. Eat low salt foods--Limit salt (sodium) to 2000 mg per day. This will help prevent your body from holding onto fluid. Read food labels as many processed foods have a lot of sodium, especially canned goods and prepackaged meats. If you would like some assistance choosing low sodium foods, we would be happy to set you up with a nutritionist. 4. Stay as active as you can everyday. Staying active will give you more energy and make your muscles stronger. Start with 5 minutes at a time and work your way up to 30 minutes a day. Break up your activities--do some in the morning and some in the afternoon. Start with 3 days per week and work your way up to 5 days as you can.  If you have chest pain, feel short of breath, dizzy, or lightheaded, STOP. If you don't feel better after a short rest, call 911. If you do feel better, call the office to let us know you have symptoms with exercise. 5. Limit all  fluids for the day to less than 2 liters. Fluid includes all drinks, coffee, juice, ice chips, soup, jello, and all other liquids.  ____________________________________________________________________  It is important that you do not miss any doses of your aspirin and plavix as these medications work to keep your stent open. You should stop taking aspirin in 1 month (05/18/20) in order to minimize your bleeding risk given need for anticoagulation with eliquis going forward.   At this time you should avoid taking Relafen (nabumetone) to minimize bleeding risk. Please avoid medications in the NSAID family (ibuprofen, motrin, naproxen, celebrex, etc) as these medications can increase your bleeding risk. You can follow-up with your pain management doctor for alternative medications if pain not well controlled with tylenol

## 2020-04-17 NOTE — Care Management (Signed)
[id number] 04-17-20 Benefits check submitted for Eliquis. Case Manager will follow for cost. Graves-Bigelow, Ocie Cornfield, RN,BSN Case Manager

## 2020-04-17 NOTE — Progress Notes (Signed)
ANTICOAGULATION CONSULT NOTE - Follow Up Consult  Pharmacy Consult for Heparin Indication: ACS/STEMI  Assessment: Pt with left sided chest pain since Friday that radiates left arm and left neck. Troponins elevated. Reviewed home medications and not on any oral anticoagulation. Pharmacy asked to start heparin.    Patient went to the cath lab today for PCI w/ DES to LAD, ticagrelor was started and heparin was discontinued.  This evening, patient developed chest pain and pressure, EKG with T wave inversions in V2 this was present prior to procedure.  No ST elevation.  Pharmacy re-consulted to dose heparin 6hr after sheath pulled (12:18 per cath report). Initial heparin level 0.32 units/ml  Goal of Therapy:  Heparin level 0.3-0.7 units/ml Monitor platelets by anticoagulation protocol: Yes   Plan:   Continue heparin IV infusion 1300 units/hr  Check heparin level in 6hrs  Daily heparin level and CBC  Thanks for allowing pharmacy to be a part of this patient's care.  Excell Seltzer, PharmD Clinical Pharmacist  04/17/2020,4:43 AM

## 2020-04-17 NOTE — Discharge Summary (Addendum)
Discharge Summary    Patient ID: Jim Martin MRN: [id number]; DOB: 13-Oct-1956  Admit date: 04/15/2020 Discharge date: 04/17/2020  Primary Care Provider: Nolene Ebbs, MD  Primary Cardiologist: Jim Dolly, MD  Primary Electrophysiologist:  Jim Martin   Discharge Diagnoses    Principal Problem:   NSTEMI (non-ST elevated myocardial infarction) [facility]) Active Problems:   Hypertension   Chronic low back pain   Hyperlipidemia   Left ventricular apical thrombus   Acute combined systolic and diastolic CHF, NYHA class 1 ([place])    Diagnostic Studies/Procedures    Left heart catheterization 04/16/20:  Mid LAD-1 lesion is 75% stenosed.  Mid LAD-2 lesion is 99% stenosed.  Mid LAD to Dist LAD lesion is 95% stenosed.  Dist LAD lesion is 70% stenosed.  Post intervention, there is a 0% residual stenosis.  Post intervention, there is a 0% residual stenosis.  A stent was successfully placed.  Post intervention, there is a 0% residual stenosis.   Acute coronary syndrome/non-STEMI secondary to 3 tandem stenoses in a large LAD system with 70% proximal at site of ulcerated plaque, 99% in the proximal to mid and 95% eccentric mid stenosis, and 70% smooth distal LAD stenosis; relatively normal left circumflex system; RCA with 70% proximal and 20% mid stenoses.  LVEDP 21 mmHg.  Successful percutaneous coronary intervention of the tandem LAD lesions with ultimate insertion of a 2.75 x 38 mm stent from distal to proximal and 3.0 x 12 mm Resolute Onyx DES stent with stent overlap to cover the most proximal stenosis.  All stenoses were reduced to 0%.  There was brisk TIMI-3 flow.  There was no change in the distal 70% LAD stenosis.  RECOMMENDATION: DAPT for minimum of 1 year.  Smoking cessation is essential.  Aggressive lipid intervention with target LDL less than 70.  Optimal blood pressure control.  An echo Doppler study will be done to assess LV function.  Echocardiogram 04/17/20: 1. LV  function is preserved at the base, but the mid to distal anterior,  anteroseptal, anterolateral, and distal inferior/inferolateral walls are  hypokinteic. The apex and anteroseptal/anterior apical regions are  akinetic. There is a prominent apical LV  thrombus seen both with and without contrast, measureing approximately 1  cm x 1.8 cm.. Left ventricular ejection fraction, by estimation, is 35 to  40%. The left ventricle has moderately decreased function. The left  ventricle demonstrates regional wall  motion abnormalities (see scoring diagram/findings for description). Left  ventricular diastolic parameters are indeterminate.  2. Right ventricular systolic function is low normal. The right  ventricular size is normal.  3. The mitral valve is normal in structure. No evidence of mitral valve  regurgitation. No evidence of mitral stenosis.  4. The aortic valve is grossly normal. Aortic valve regurgitation is not  visualized. No aortic stenosis is present.  5. The inferior vena cava is normal in size with greater than 50%  respiratory variability, suggesting right atrial pressure of 3 mmHg.  6. Cannot exclude intraatrial shunt.  _____________   History of Present Illness     Jim Martin is a 64 y.o. male with  history of essential hypertension, chronic lower back pain, migraines and anxiety.  Jim Martin states that he experienced mid scapular upper back discomfort while talking to a friend on the phone on Friday.  Symptoms moved into his chest and left arm, describes it as a  "sharp" discomfort.  This resolved after several minutes and on Saturday he states that he felt washed out,  did not do very much during the day.  On Sunday he got up to go to church and had recurring discomfort in his chest and left arm prompting evaluation in the [place].  He does not report any recent exertional symptoms, states that he has a history of reflux but that the symptoms were worse.  Evaluation  in the ER includes peak high-sensitivity troponin I of 1258, ECG with deep anterior/anterolateral T wave inversions following initial tracing with ventricular bigeminy, all new in comparison to previous tracings from 2017.  He has been treated with aspirin, heparin infusion, and has nitroglycerin by ER staff.  States that he is generally comfortable at rest, however has recurring symptoms when he is out of bed to use the bathroom.  Record review finds previous cardiology evaluation by Jim Martin in 2017 for chest discomfort.  Lexiscan Cardiolite at that time was low risk with LVEF 55 to 65% and suspected diaphragmatic attenuation.   Hospital Course     Consultants: Jim Martin   1. NSTEMI: patient presented with intermittent chest pain for the past few days. He was found to have new anterolateral TWIs on EKG. HsTrop peaked at 5179. He was taken to the cath lab 04/16/20 and found to have severe single vessel CAD in the LAD managed with overlapping PCI/DES to the p-mLAD lesions with residual 70% dLAD stenosis medically managed. Initially recommended for DAPT with aspirin and brilinta. He noted recurrent chest discomfort overnight and heparin/nitro gtt restarted. Apical thrombus on echo and was transitioned from brilinta to plavix to minimize bleeding risk. Symptoms improved.  - Continue aspirin and plavix with plans to stop aspirin after 1 month - Continue statin - Continue metoprolol  2. Apical thrombus: noted on echo this admission. Started on eliquis 5mg  BID.  - Continue eliquis 5mg  BID - Anticipate surveillance echo in 3 months to monitor for resolution  3. Acute combined CHF: EF 35-40%, with akinesis of the anteroseptal/anterior apical regions, prominent apical thrombus, indeterminate LV diastolic function, and no significant valvular abnormalities. He appeared euvolemic on exam. He was start on metoprolol tartrate and consolidated to succinate at discharge. Intermittently soft BP's limited addition  of ACEi/ARB/ARNI at this time.  - Continue metoprolol succinate 25mg  daiily - Consider addition of ACEi/ARB/ARNI outpatient as BP will allow.  - Encouraged low sodium diet and daily weights.  - Ancipitate surveillance echo in 3 months  4. HTN: BP stable this admission - Managed in the context of #1 and 3  5. HLD: LDL 97 this admission. Started on atorvastatin 80mg  daily in light of #1 - Continue atorvastatin - Plan to repeat FLP/LFTs in 6-8 weeks.   6. Chronic back pain: home regimen continued during admission - Continue management per pain management outpatient  7. Tobacco abuse: - Smoking cessation encouraged. Information on health risks of smoking and tips for quitting provided at discharge.       Did the patient have an acute coronary syndrome (MI, NSTEMI, STEMI, etc) this admission?:  Yes                               AHA/ACC Clinical Performance & Quality Measures: 1. Aspirin prescribed? - Yes 2. ADP Receptor Inhibitor (Plavix/Clopidogrel, Brilinta/Ticagrelor or Effient/Prasugrel) prescribed (includes medically managed patients)? - Yes 3. Beta Blocker prescribed? - Yes 4. High Intensity Statin (Lipitor 40-80mg  or Crestor 20-40mg ) prescribed? - Yes 5. EF assessed during THIS hospitalization? - Yes 6. For EF <  40%, was ACEI/ARB prescribed? - No - Reason:  BP soft 7. For EF <40%, Aldosterone Antagonist (Spironolactone or Eplerenone) prescribed? - No - Reason:  BP soft 8. Cardiac Rehab Phase II ordered (Included Medically managed Patients)? - Yes   _____________  Discharge Vitals Blood pressure 131/85, pulse 63, temperature 98.7 F (37.1 C), temperature source Oral, resp. rate 12, height 5\' 6"  (1.676 m), weight 70.2 kg, SpO2 97 %.  Filed Weights   04/15/20 1416 04/16/20 1300 04/17/20 0410  Weight: 71.7 kg 69.3 kg 70.2 kg    Labs & Radiologic Studies    CBC Recent Labs    04/16/20 0521 04/17/20 0331  WBC 12.1* 12.2*  NEUTROABS 8.1*  --   HGB 15.0 13.0  HCT 45.8  39.2  MCV 95.0 94.9  PLT 221 0000000   Basic Metabolic Panel Recent Labs    04/16/20 0521 04/17/20 0331  NA 138 138  K 3.3* 3.6  CL 107 109  CO2 23 20*  GLUCOSE 108* 106*  BUN 13 10  CREATININE 0.90 0.84  CALCIUM 8.8* 8.7*  MG 1.9  --   PHOS 3.0  --    Liver Function Tests Recent Labs    04/17/20 0331  AST 36  ALT 17  ALKPHOS 74  BILITOT 0.7  PROT 5.4*  ALBUMIN 3.0*   No results for input(s): LIPASE, AMYLASE in the last 72 hours. High Sensitivity Troponin:   Recent Labs  Lab 04/16/20 0725 04/16/20 1328 04/16/20 1520 04/16/20 2051 04/17/20 0331  TROPONINIHS 680* 737* 898* 2,897* 5,179*    BNP Invalid input(s): POCBNP D-Dimer No results for input(s): DDIMER in the last 72 hours. Hemoglobin A1C Recent Labs    04/17/20 0331  HGBA1C 5.2   Fasting Lipid Panel Recent Labs    04/17/20 0331  CHOL 144  HDL 28*  LDLCALC 97  TRIG 93  CHOLHDL 5.1   Thyroid Function Tests No results for input(s): TSH, T4TOTAL, T3FREE, THYROIDAB in the last 72 hours.  Invalid input(s): FREET3 _____________  CARDIAC CATHETERIZATION  Result Date: 04/16/2020  Mid LAD-1 lesion is 75% stenosed.  Mid LAD-2 lesion is 99% stenosed.  Mid LAD to Dist LAD lesion is 95% stenosed.  Dist LAD lesion is 70% stenosed.  Post intervention, there is a 0% residual stenosis.  Post intervention, there is a 0% residual stenosis.  A stent was successfully placed.  Post intervention, there is a 0% residual stenosis.  Acute coronary syndrome/non-STEMI secondary to 3 tandem stenoses in a large LAD system with 70% proximal at site of ulcerated plaque, 99% in the proximal to mid and 95% eccentric mid stenosis, and 70% smooth distal LAD stenosis; relatively normal left circumflex system; RCA with 70% proximal and 20% mid stenoses. LVEDP 21 mmHg. Successful percutaneous coronary intervention of the tandem LAD lesions with ultimate insertion of a 2.75 x 38 mm stent from distal to proximal and 3.0 x 12 mm  Resolute Onyx DES stent with stent overlap to cover the most proximal stenosis.  All stenoses were reduced to 0%.  There was brisk TIMI-3 flow.  There was no change in the distal 70% LAD stenosis. RECOMMENDATION: DAPT for minimum of 1 year.  Smoking cessation is essential.  Aggressive lipid intervention with target LDL less than 70.  Optimal blood pressure control.  An echo Doppler study will be done to assess LV function.   DG Chest Portable 1 View  Result Date: 04/15/2020 CLINICAL DATA:  Pt with left sided cp since Friday that radiates  left arm and left neck. EXAM: PORTABLE CHEST 1 VIEW COMPARISON:  Chest radiograph 11/10/2016 FINDINGS: Stable cardiomediastinal contours. A spinal stimulator projects over the lower mediastinum. The lungs are clear. No pneumothorax or significant pleural effusion. No acute finding in the visualized skeleton. IMPRESSION: No acute cardiopulmonary finding. Electronically Signed   By: Audie Pinto M.D.   On: 04/15/2020 14:43   ECHOCARDIOGRAM COMPLETE  Result Date: 04/17/2020    ECHOCARDIOGRAM REPORT   Patient Name:   Jim Martin Date of Exam: 04/17/2020 Medical Rec #:  [id number]      Height:       66.0 in Accession #:    [id number]     Weight:       154.7 lb Date of Birth:  1956/05/31     BSA:          1.793 m Patient Age:    25 years       BP:           117/73 mmHg Patient Gender: M              HR:           65 bpm. Exam Location:  Inpatient Procedure: 2D Echo and Intracardiac Opacification Agent STAT ECHO Indications:    acute coronary syndrome  History:        Patient has no prior history of Echocardiogram examinations.                 Signs/Symptoms:Chest Pain.  Sonographer:    Johny Chess Referring Phys: [id number] THOMAS A KELLY IMPRESSIONS  1. LV function is preserved at the base, but the mid to distal anterior, anteroseptal, anterolateral, and distal inferior/inferolateral walls are hypokinteic. The apex and anteroseptal/anterior apical regions are akinetic. There  is a prominent apical LV  thrombus seen both with and without contrast, measureing approximately 1 cm x 1.8 cm.. Left ventricular ejection fraction, by estimation, is 35 to 40%. The left ventricle has moderately decreased function. The left ventricle demonstrates regional wall motion abnormalities (see scoring diagram/findings for description). Left ventricular diastolic parameters are indeterminate.  2. Right ventricular systolic function is low normal. The right ventricular size is normal.  3. The mitral valve is normal in structure. No evidence of mitral valve regurgitation. No evidence of mitral stenosis.  4. The aortic valve is grossly normal. Aortic valve regurgitation is not visualized. No aortic stenosis is present.  5. The inferior vena cava is normal in size with greater than 50% respiratory variability, suggesting right atrial pressure of 3 mmHg.  6. Cannot exclude intraatrial shunt. Comparison(s): No prior Echocardiogram. Conclusion(s)/Recommendation(s): Hypokinetic to akinetic in the mid to distal LAD territory, including apical inferior segments. LV thrombus visualized. Findings communicated with Teodoro Kil, PA. FINDINGS  Left Ventricle: LV function is preserved at the base, but the mid to distal anterior, anteroseptal, anterolateral, and distal inferior/inferolateral walls are hypokinteic. The apex and anteroseptal/anterior apical regions are akinetic. There is a prominent apical LV thrombus seen both with and without contrast, measureing approximately 1 cm x 1.8 cm. Left ventricular ejection fraction, by estimation, is 35 to 40%. The left ventricle has moderately decreased function. The left ventricle demonstrates regional wall motion abnormalities. Definity contrast agent was given IV to delineate the left ventricular endocardial borders. The left ventricular internal cavity size was normal in size. There is no left ventricular hypertrophy. Left ventricular diastolic parameters are indeterminate.   LV Wall Scoring: The apical septal segment, apical anterior segment, and apex are  akinetic. The mid anteroseptal segment, apical lateral segment, mid anterolateral segment, mid inferoseptal segment, mid anterior segment, and apical inferior segment are hypokinetic. The inferior wall, posterior wall, basal anteroseptal segment, basal anterolateral segment, basal anterior segment, and basal inferoseptal segment are normal. Right Ventricle: The right ventricular size is normal. No increase in right ventricular wall thickness. Right ventricular systolic function is low normal. Left Atrium: Left atrial size was normal in size. Right Atrium: Right atrial size was normal in size. Pericardium: A small pericardial effusion is present. Mitral Valve: The mitral valve is normal in structure. No evidence of mitral valve regurgitation. No evidence of mitral valve stenosis. Tricuspid Valve: The tricuspid valve is normal in structure. Tricuspid valve regurgitation is trivial. No evidence of tricuspid stenosis. Aortic Valve: The aortic valve is grossly normal. Aortic valve regurgitation is not visualized. No aortic stenosis is present. Pulmonic Valve: The pulmonic valve was grossly normal. Pulmonic valve regurgitation is not visualized. No evidence of pulmonic stenosis. Aorta: The aortic root, ascending aorta and aortic arch are all structurally normal, with no evidence of dilitation or obstruction. Pulmonary Artery: The pulmonary artery is not well seen. Venous: The inferior vena cava is normal in size with greater than 50% respiratory variability, suggesting right atrial pressure of 3 mmHg. IAS/Shunts: Cannot exclude intraatrial shunt.  LEFT VENTRICLE PLAX 2D LVIDd:         5.10 cm  Diastology LVIDs:         3.30 cm  LV e' lateral:   4.24 cm/s LV PW:         1.30 cm  LV E/e' lateral: 12.9 LV IVS:        1.10 cm  LV e' medial:    6.20 cm/s LVOT diam:     2.20 cm  LV E/e' medial:  8.8 LV SV:         69 LV SV Index:   38 LVOT Area:      3.80 cm  RIGHT VENTRICLE             IVC RV S prime:     12.30 cm/s  IVC diam: 2.00 cm TAPSE (M-mode): 1.9 cm LEFT ATRIUM             Index       RIGHT ATRIUM           Index LA diam:        3.50 cm 1.95 cm/m  RA Area:     11.40 cm LA Vol (A2C):   57.3 ml 31.96 ml/m RA Volume:   24.00 ml  13.39 ml/m LA Vol (A4C):   39.2 ml 21.86 ml/m LA Biplane Vol: 47.4 ml 26.44 ml/m  AORTIC VALVE LVOT Vmax:   86.20 cm/s LVOT Vmean:  60.200 cm/s LVOT VTI:    0.181 m  AORTA Ao Root diam: 3.30 cm MITRAL VALVE MV Area (PHT): 2.80 cm    SHUNTS MV Decel Time: 271 msec    Systemic VTI:  0.18 m MV E velocity: 54.80 cm/s  Systemic Diam: 2.20 cm MV A velocity: 76.70 cm/s MV E/A ratio:  0.71 Buford Dresser MD Electronically signed by Buford Dresser MD Signature Date/Time: 04/17/2020/9:37:07 AM    Final    Disposition   Pt is being discharged home today in good condition.  Follow-up Plans & Appointments    Follow-up Information    Imogene Burn, PA-C Follow up on 05/01/2020.   Specialty: Cardiology Why: Please arrive 15 minutes early for your 2pm  post-hospital cardiology follow-up appointment Contact information: [place] [address] [phone]          Discharge Instructions    Amb Referral to Cardiac Rehabilitation   Complete by: As directed    Diagnosis:  NSTEMI Coronary Stents     After initial evaluation and assessments completed: Virtual Based Care may be provided alone or in conjunction with Phase 2 Cardiac Rehab based on patient barriers.: Yes   Diet - low sodium heart healthy   Complete by: As directed    Increase activity slowly   Complete by: As directed       Discharge Medications   Allergies as of 04/17/2020   No Known Allergies     Medication List    STOP taking these medications   amLODipine 10 MG tablet Commonly known as: NORVASC     TAKE these medications   apixaban 5 MG Tabs tablet Commonly known as: ELIQUIS Take 1 tablet (5 mg total)  by mouth 2 (two) times daily.   aspirin EC 81 MG tablet Take 81 mg by mouth daily.   atorvastatin 80 MG tablet Commonly known as: LIPITOR Take 1 tablet (80 mg total) by mouth daily. Start taking on: Apr 18, 2020   clopidogrel 75 MG tablet Commonly known as: PLAVIX Take 1 tablet (75 mg total) by mouth daily. Start taking on: Apr 18, 2020   DULoxetine 30 MG capsule Commonly known as: CYMBALTA Take 30 mg by mouth daily.   famotidine 20 MG tablet Commonly known as: PEPCID Take 1 tablet (20 mg total) by mouth 2 (two) times daily.   folic acid 1 MG tablet Commonly known as: FOLVITE Take 1 mg by mouth daily.   gabapentin 300 MG capsule Commonly known as: NEURONTIN Take 300 mg by mouth 3 (three) times daily.   methocarbamol 750 MG tablet Commonly known as: ROBAXIN Take 750 mg by mouth 3 (three) times daily.   metoprolol succinate 25 MG 24 hr tablet Commonly known as: TOPROL-XL Take 1 tablet (25 mg total) by mouth daily. Start taking on: Apr 18, 2020   nabumetone 750 MG tablet Commonly known as: RELAFEN Take 750 mg by mouth 2 (two) times daily.   nitroGLYCERIN 0.4 MG SL tablet Commonly known as: NITROSTAT Place 1 tablet (0.4 mg total) under the tongue every 5 (five) minutes as needed for chest pain (CP or SOB).   oxyCODONE 15 MG immediate release tablet Commonly known as: ROXICODONE Take 15 mg by mouth 5 (five) times daily.   pantoprazole 40 MG tablet Commonly known as: PROTONIX Take 40 mg by mouth daily.   topiramate 100 MG tablet Commonly known as: TOPAMAX TAKE ONE TABLET BY MOUTH AT BEDTIME.   zolpidem 10 MG tablet Commonly known as: AMBIEN Take 10 mg by mouth at bedtime as needed for sleep.          Outstanding Labs/Studies   Repeat FLP/LFTs in 6-8 weeks  Surveillance echo in 3 months  Duration of Discharge Encounter   Greater than 30 minutes including physician time.  Signed, Abigail Butts, PA-C 04/17/2020, 12:46 PM   I have examined the  patient and reviewed assessment and plan and discussed with patient.  Agree with above as stated.    GEN: Well nourished, well developed, in no acute distress  HEENT: normal  Neck: no JVD, carotid bruits, or masses Cardiac: RRR; no murmurs, rubs, or gallops,no edema  Respiratory:  clear to auscultation bilaterally, normal work of breathing GI: soft,  nontender, nondistended,  MS: no deformity or atrophy ; no radial hematoma Skin: warm and dry, no rash Neuro:  Strength and sensation are intact Psych: euthymic mood, full affect  I personally reviewed the echo images and there does appear to be an apical thrombus. Will have to treat with aspirin, clopidogrel and ELiquis for 3 months.  After 1 month, drop aspirin.  Brilinta changed to Plavix to lower bleeding risk.   COntinue beta blocker.  No ACE-I due to borderline BP.  If BP higher at f/u, could be started at that time.  F/u with Jim Martin.   Larae Grooms

## 2020-04-17 NOTE — Progress Notes (Signed)
Reviewed information on purpose of Plavix and Eliquis. Need for 2 blood thinners and how each one works. Need for aspirin initially. Precautions, medication interactions, bleeding risk and importance of taking medication as scheduled.  Reviewed healing process of heart after MI and activity, medication importance in the healing process.  Heart failure booklet given and reviewed heart failure education with patient and daughter with good verbalized understanding.  Patient was encouraged to f/u with cardiac rehab. Instructed to get instructions from provider in f/u visit and take it easy until then. Patient agrees to follow above instructions.

## 2020-04-20 MED FILL — Verapamil HCl IV Soln 2.5 MG/ML: INTRAVENOUS | Qty: 2 | Status: AC

## 2020-04-24 NOTE — Progress Notes (Signed)
Cardiology Office Note    Date:  05/01/2020   ID:  Jim Martin, DOB 10-Jan-1956, MRN [id number]  PCP:  Nolene Ebbs, MD  Cardiologist: Carlyle Dolly, MD EPS: None  Chief Complaint  Patient presents with  . Hospitalization Follow-up    History of Present Illness:  Jim Martin is a 64 y.o. male with history of hypertension, chronic back pain, migraines, anxiety, pain with low risk Lexiscan 2017  Patient discharged 04/17/20 after he had NSTEMI peak troponin of 5179 treated with DES to the LAD with residual 70% distal LAD managed medically.  Plan for DAPT with aspirin and Brilinta.  Recurrent chest pain after cath and heparin and nitroglycerin restarted.  There was apical thrombus on echo and Eliquis was started and he was transitioned from Brilinta to Plavix in addition to aspirin to minimize bleeding risk.  Plan to stop aspirin after 1 month.  Plan for surveillance echo in 3 months.  LVEF 35 to 40%  Patient comes in for f/u. Denies chest pain, dyspnea, dizziness, palpitations, edema. 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 Waking a lot on his property. He quit smoking!  Considering cardiac rehab.  Past Medical History:  Diagnosis Date  . Anxiety   . 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 Chronic low back pain   . Coronary artery disease   . Hypertension     Past Surgical History:  Procedure Laterality Date  . BRAIN SURGERY    . COLONOSCOPY  11/2009   Dr. Oletta Lamas: medium-sized internal hemorrhoids, normal   . CORONARY STENT INTERVENTION N/A 04/16/2020   Procedure: CORONARY STENT INTERVENTION;  Surgeon: Troy Sine, MD;  Location: [facility];  Service: Cardiovascular;  Laterality: N/A;  . LEFT HEART CATH AND CORONARY ANGIOGRAPHY N/A 04/16/2020   Procedure: LEFT HEART CATH AND CORONARY ANGIOGRAPHY;  Surgeon: Troy Sine, MD;  Location: [facility];  Service: Cardiovascular;  Laterality: N/A;  . Lumbosacral spine surgery    . SPINAL CORD  STIMULATOR IMPLANT      Current Medications: Current Meds  Medication Sig  . apixaban (ELIQUIS) 5 MG TABS tablet Take 1 tablet (5 mg total) by mouth 2 (two) times daily.  Marland Kitchen aspirin EC 81 MG tablet Take 81 mg by mouth daily.  Marland Kitchen atorvastatin (LIPITOR) 80 MG tablet Take 1 tablet (80 mg total) by mouth daily.  . clopidogrel (PLAVIX) 75 MG tablet Take 1 tablet (75 mg total) by mouth daily.  . DULoxetine (CYMBALTA) 30 MG capsule Take 30 mg by mouth daily.  . famotidine (PEPCID) 20 MG tablet Take 1 tablet (20 mg total) by mouth 2 (two) times daily.  . folic acid (FOLVITE) 1 MG tablet Take 1 mg by mouth daily.  Marland Kitchen gabapentin (NEURONTIN) 300 MG capsule Take 300 mg by mouth 3 (three) times daily.  . methocarbamol (ROBAXIN) 750 MG tablet Take 750 mg by mouth 3 (three) times daily.  . metoprolol succinate (TOPROL-XL) 25 MG 24 hr tablet Take 1 tablet (25 mg total) by mouth daily.  . nitroGLYCERIN (NITROSTAT) 0.4 MG SL tablet Place 1 tablet (0.4 mg total) under the tongue every 5 (five) minutes as needed for chest pain (CP or SOB).  Marland Kitchen oxyCODONE (ROXICODONE) 15 MG immediate release tablet Take 15 mg by mouth 5 (five) times daily.   . pantoprazole (PROTONIX) 40 MG tablet Take 40 mg by mouth daily.  Marland Kitchen topiramate (TOPAMAX) 100 MG tablet TAKE ONE TABLET BY MOUTH AT BEDTIME.  Marland Kitchen  zolpidem (AMBIEN) 10 MG tablet Take 10 mg by mouth at bedtime as needed for sleep.     Allergies:   Patient has no known allergies.   Social History   Socioeconomic History  . Marital status: Divorced    Spouse name: Not on file  . Number of children: 1  . Years of education: 8th  . Highest education level: Not on file  Occupational History  . Occupation: disabilty  Tobacco Use  . Smoking status: Former Smoker    Packs/day: 1.50    Types: Cigarettes    Quit date: 04/16/2020    Years since quitting: 0.0  . Smokeless tobacco: Never Used  Substance and Sexual Activity  . Alcohol use: No    Alcohol/week: 0.0 standard drinks    . Drug use: No  . Sexual activity: Not on file  Other Topics Concern  . Not on file  Social History Narrative   Patient is single with one child.   Patient is left handed.   Patient has an 8 th grade education.   Patient drinks 5 or more sodas daily.   Social Determinants of Health   Financial Resource Strain:   . Difficulty of Paying Living Expenses:   Food Insecurity:   . Worried About Charity fundraiser in the Last Year:   . Arboriculturist in the Last Year:   Transportation Needs:   . Film/video editor (Medical):   Marland Kitchen Lack of Transportation (Non-Medical):   Physical Activity:   . Days of Exercise per Week:   . Minutes of Exercise per Session:   Stress:   . Feeling of Stress :   Social Connections:   . Frequency of Communication with Friends and Family:   . Frequency of Social Gatherings with Friends and Family:   . Attends Religious Services:   . Active Member of Clubs or Organizations:   . Attends Archivist Meetings:   Marland Kitchen Marital Status:      Family History:  The patient's   family history includes Cancer in his mother; Heart attack in his brother and father.   ROS:   Please see the history of present illness.    ROS All other systems reviewed and are negative.   PHYSICAL EXAM:   VS:  BP 138/80   Pulse (!) 48   Ht 5\' 6"  (1.676 m)   Wt 166 lb 9.6 oz (75.6 kg)   SpO2 98%   BMI 26.89 kg/m   Physical Exam  GEN: Well nourished, well developed, in no acute distress  Neck: no JVD, carotid bruits, or masses Cardiac:RRR; no murmurs, rubs, or gallops  Respiratory: Decreased breath sounds throughout but clear GI: soft, nontender, nondistended, + BS Ext: Right arm at cath site has small knot but no hematoma or hemorrhage good radial brachial pulses, lower extremities without cyanosis, clubbing, or edema, Good distal pulses bilaterally Neuro:  Alert and Oriented x 3 Psych: euthymic mood, full affect  Wt Readings from Last 3 Encounters:  05/01/20 166  lb 9.6 oz (75.6 kg)  04/17/20 154 lb 10.8 oz (70.2 kg)  09/05/19 164 lb 9.6 oz (74.7 kg)      Studies/Labs Reviewed:   EKG:  EKG is not ordered today.   Recent Labs: 04/16/2020: Magnesium 1.9 04/17/2020: ALT 17; BUN 10; Creatinine, Ser 0.84; Hemoglobin 13.0; Platelets 233; Potassium 3.6; Sodium 138   Lipid Panel    Component Value Date/Time   CHOL 144 04/17/2020 0331   TRIG  93 04/17/2020 0331   HDL 28 (L) 04/17/2020 0331   CHOLHDL 5.1 04/17/2020 0331   VLDL 19 04/17/2020 0331   LDLCALC 97 04/17/2020 0331    Additional studies/ records that were reviewed today include:   Left heart catheterization 04/16/20:  Mid LAD-1 lesion is 75% stenosed.  Mid LAD-2 lesion is 99% stenosed.  Mid LAD to Dist LAD lesion is 95% stenosed.  Dist LAD lesion is 70% stenosed.  Post intervention, there is a 0% residual stenosis.  Post intervention, there is a 0% residual stenosis.  A stent was successfully placed.  Post intervention, there is a 0% residual stenosis.   Acute coronary syndrome/non-STEMI secondary to 3 tandem stenoses in a large LAD system with 70% proximal at site of ulcerated plaque, 99% in the proximal to mid and 95% eccentric mid stenosis, and 70% smooth distal LAD stenosis; relatively normal left circumflex system; RCA with 70% proximal and 20% mid stenoses.   LVEDP 21 mmHg.   Successful percutaneous coronary intervention of the tandem LAD lesions with ultimate insertion of a 2.75 x 38 mm stent from distal to proximal and 3.0 x 12 mm Resolute Onyx DES stent with stent overlap to cover the most proximal stenosis.  All stenoses were reduced to 0%.  There was brisk TIMI-3 flow.  There was no change in the distal 70% LAD stenosis.   RECOMMENDATION: DAPT for minimum of 1 year.  Smoking cessation is essential.  Aggressive lipid intervention with target LDL less than 70.  Optimal blood pressure control.  An echo Doppler study will be done to assess LV function.   Echocardiogram  04/17/20: 1. LV function is preserved at the base, but the mid to distal anterior,  anteroseptal, anterolateral, and distal inferior/inferolateral walls are  hypokinteic. The apex and anteroseptal/anterior apical regions are  akinetic. There is a prominent apical LV   thrombus seen both with and without contrast, measureing approximately 1  cm x 1.8 cm.. Left ventricular ejection fraction, by estimation, is 35 to  40%. The left ventricle has moderately decreased function. The left  ventricle demonstrates regional wall  motion abnormalities (see scoring diagram/findings for description). Left  ventricular diastolic parameters are indeterminate.   2. Right ventricular systolic function is low normal. The right  ventricular size is normal.   3. The mitral valve is normal in structure. No evidence of mitral valve  regurgitation. No evidence of mitral stenosis.   4. The aortic valve is grossly normal. Aortic valve regurgitation is not  visualized. No aortic stenosis is present.   5. The inferior vena cava is normal in size with greater than 50%  respiratory variability, suggesting right atrial pressure of 3 mmHg.   6. Cannot exclude intraatrial shunt.  _____________     ASSESSMENT:    1. Coronary artery disease involving native coronary artery of native heart without angina pectoris   2. Left ventricular apical thrombus   3. Ischemic cardiomyopathy   4. Essential hypertension   5. Hyperlipidemia, unspecified hyperlipidemia type   6. Tobacco abuse      PLAN:  In order of problems listed above:  CAD status post NSTEMI treated with DES to the mid LAD with residual 70% distal LAD to be managed medically on aspirin Plavix and Eliquis with plans to stop aspirin after 1 month which will be June 4.  No bleeding problems.  No angina.  Continue statin and metoprolol.  Heart rate 48 today.  He is asymptomatic.  He will keep a record  of his blood pressure and pulse at home and bring it at his  next visit.  To do cardiac rehab.  Apical thrombus on Eliquis 5 mg twice daily needs surveillance echo in 3 months  Ischemic cardiomyopathy ejection fraction 35 to 40% with akinesis of the anterior septal/anterior apical regions and prominent apical thrombus.  On metoprolol but blood pressure was soft so no ACE inhibitor or ARB was started  Essential hypertension running low in hospital but stable today.  Consider ACEI/ARB at next office visit if blood pressure remains stable and patient brings his readings in.  Hyperlipidemia follow-up fasting lipid panel and LFTs in 3 months  Tobacco abuse he quit smoking.    Medication Adjustments/Labs and Tests Ordered: Current medicines are reviewed at length with the patient today.  Concerns regarding medicines are outlined above.  Medication changes, Labs and Tests ordered today are listed in the Patient Instructions below. Patient Instructions  Medication Instructions:  Your physician has recommended you make the following change in your medication:  Stop Taking Aspirin on 05/18/20   *If you need a refill on your cardiac medications before your next appointment, please call your pharmacy*   Lab Work: Your physician recommends that you return for lab work in: 3 Months   If you have labs (blood work) drawn today and your tests are completely normal, you will receive your results only by: Marland Kitchen MyChart Message (if you have MyChart) OR . A paper copy in the mail If you have any lab test that is abnormal or we need to change your treatment, we will call you to review the results.   Testing/Procedures: Your physician has requested that you have an echocardiogram. Echocardiography is a painless test that uses sound waves to create images of your heart. It provides your doctor with information about the size and shape of your heart and how well your heart's chambers and valves are working. This procedure takes approximately one hour. There are no  restrictions for this procedure.     Follow-Up: At [facility], you and your health needs are our priority.  As part of our continuing mission to provide you with exceptional heart care, we have created designated Provider Care Teams.  These Care Teams include your primary Cardiologist (physician) and Advanced Practice Providers (APPs -  Physician Assistants and Nurse Practitioners) who all work together to provide you with the care you need, when you need it.  We recommend signing up for the patient portal called "MyChart".  Sign up information is provided on this After Visit Summary.  MyChart is used to connect with patients for Virtual Visits (Telemedicine).  Patients are able to view lab/test results, encounter notes, upcoming appointments, etc.  Non-urgent messages can be sent to your provider as well.   To learn more about what you can do with MyChart, go to [email or web address].    Your next appointment:   2 month(s)  The format for your next appointment:   In Person  Provider:   Carlyle Dolly, MD   Other Instructions Thank you for choosing [facility]!       Signed, Ermalinda Barrios, PA-C  05/01/2020 2:28 PM    [facility] [address], [place], [place]  [address] Phone: [phone]; Fax: [phone]

## 2020-04-30 DIAGNOSIS — Z6826 Body mass index (BMI) 26.0-26.9, adult: Secondary | ICD-10-CM | POA: Diagnosis not present

## 2020-04-30 DIAGNOSIS — M79621 Pain in right upper arm: Secondary | ICD-10-CM | POA: Diagnosis not present

## 2020-04-30 DIAGNOSIS — M79661 Pain in right lower leg: Secondary | ICD-10-CM | POA: Diagnosis not present

## 2020-04-30 DIAGNOSIS — Z79891 Long term (current) use of opiate analgesic: Secondary | ICD-10-CM | POA: Diagnosis not present

## 2020-04-30 DIAGNOSIS — M545 Low back pain: Secondary | ICD-10-CM | POA: Diagnosis not present

## 2020-04-30 DIAGNOSIS — M79605 Pain in left leg: Secondary | ICD-10-CM | POA: Diagnosis not present

## 2020-04-30 DIAGNOSIS — M79622 Pain in left upper arm: Secondary | ICD-10-CM | POA: Diagnosis not present

## 2020-04-30 DIAGNOSIS — G894 Chronic pain syndrome: Secondary | ICD-10-CM | POA: Diagnosis not present

## 2020-04-30 DIAGNOSIS — M79604 Pain in right leg: Secondary | ICD-10-CM | POA: Diagnosis not present

## 2020-04-30 DIAGNOSIS — R7303 Prediabetes: Secondary | ICD-10-CM | POA: Diagnosis not present

## 2020-04-30 DIAGNOSIS — M542 Cervicalgia: Secondary | ICD-10-CM | POA: Diagnosis not present

## 2020-04-30 DIAGNOSIS — G629 Polyneuropathy, unspecified: Secondary | ICD-10-CM | POA: Diagnosis not present

## 2020-04-30 DIAGNOSIS — M79662 Pain in left lower leg: Secondary | ICD-10-CM | POA: Diagnosis not present

## 2020-04-30 DIAGNOSIS — G8929 Other chronic pain: Secondary | ICD-10-CM | POA: Diagnosis not present

## 2020-04-30 DIAGNOSIS — F1120 Opioid dependence, uncomplicated: Secondary | ICD-10-CM | POA: Diagnosis not present

## 2020-05-01 ENCOUNTER — Encounter: Payer: Self-pay | Admitting: Physician Assistant

## 2020-05-01 ENCOUNTER — Other Ambulatory Visit: Payer: Self-pay

## 2020-05-01 ENCOUNTER — Ambulatory Visit: Payer: PPO | Admitting: Physician Assistant

## 2020-05-01 VITALS — BP 138/80 | HR 48 | Ht 66.0 in | Wt 166.6 lb

## 2020-05-01 DIAGNOSIS — I10 Essential (primary) hypertension: Secondary | ICD-10-CM

## 2020-05-01 DIAGNOSIS — I513 Intracardiac thrombosis, not elsewhere classified: Secondary | ICD-10-CM | POA: Diagnosis not present

## 2020-05-01 DIAGNOSIS — E785 Hyperlipidemia, unspecified: Secondary | ICD-10-CM

## 2020-05-01 DIAGNOSIS — Z720 Tobacco use: Secondary | ICD-10-CM

## 2020-05-01 DIAGNOSIS — I255 Ischemic cardiomyopathy: Secondary | ICD-10-CM | POA: Diagnosis not present

## 2020-05-01 DIAGNOSIS — I2510 Atherosclerotic heart disease of native coronary artery without angina pectoris: Secondary | ICD-10-CM

## 2020-05-01 NOTE — Patient Instructions (Signed)
Medication Instructions:  Your physician has recommended you make the following change in your medication:  Stop Taking Aspirin on 05/18/20   *If you need a refill on your cardiac medications before your next appointment, please call your pharmacy*   Lab Work: Your physician recommends that you return for lab work in: 3 Months   If you have labs (blood work) drawn today and your tests are completely normal, you will receive your results only by: Marland Kitchen MyChart Message (if you have MyChart) OR . A paper copy in the mail If you have any lab test that is abnormal or we need to change your treatment, we will call you to review the results.   Testing/Procedures: Your physician has requested that you have an echocardiogram. Echocardiography is a painless test that uses sound waves to create images of your heart. It provides your doctor with information about the size and shape of your heart and how well your heart's chambers and valves are working. This procedure takes approximately one hour. There are no restrictions for this procedure.     Follow-Up: At [facility], you and your health needs are our priority.  As part of our continuing mission to provide you with exceptional heart care, we have created designated Provider Care Teams.  These Care Teams include your primary Cardiologist (physician) and Advanced Practice Providers (APPs -  Physician Assistants and Nurse Practitioners) who all work together to provide you with the care you need, when you need it.  We recommend signing up for the patient portal called "MyChart".  Sign up information is provided on this After Visit Summary.  MyChart is used to connect with patients for Virtual Visits (Telemedicine).  Patients are able to view lab/test results, encounter notes, upcoming appointments, etc.  Non-urgent messages can be sent to your provider as well.   To learn more about what you can do with MyChart, go to [email or web address].    Your  next appointment:   2 month(s)  The format for your next appointment:   In Person  Provider:   Carlyle Dolly, MD   Other Instructions Thank you for choosing [facility]!

## 2020-06-07 DIAGNOSIS — Z6826 Body mass index (BMI) 26.0-26.9, adult: Secondary | ICD-10-CM | POA: Diagnosis not present

## 2020-06-07 DIAGNOSIS — M545 Low back pain: Secondary | ICD-10-CM | POA: Diagnosis not present

## 2020-06-07 DIAGNOSIS — M79605 Pain in left leg: Secondary | ICD-10-CM | POA: Diagnosis not present

## 2020-06-07 DIAGNOSIS — M79621 Pain in right upper arm: Secondary | ICD-10-CM | POA: Diagnosis not present

## 2020-06-07 DIAGNOSIS — F1120 Opioid dependence, uncomplicated: Secondary | ICD-10-CM | POA: Diagnosis not present

## 2020-06-07 DIAGNOSIS — M79662 Pain in left lower leg: Secondary | ICD-10-CM | POA: Diagnosis not present

## 2020-06-07 DIAGNOSIS — M79604 Pain in right leg: Secondary | ICD-10-CM | POA: Diagnosis not present

## 2020-06-07 DIAGNOSIS — G8929 Other chronic pain: Secondary | ICD-10-CM | POA: Diagnosis not present

## 2020-06-07 DIAGNOSIS — R7303 Prediabetes: Secondary | ICD-10-CM | POA: Diagnosis not present

## 2020-06-07 DIAGNOSIS — M79622 Pain in left upper arm: Secondary | ICD-10-CM | POA: Diagnosis not present

## 2020-06-07 DIAGNOSIS — G894 Chronic pain syndrome: Secondary | ICD-10-CM | POA: Diagnosis not present

## 2020-06-07 DIAGNOSIS — M542 Cervicalgia: Secondary | ICD-10-CM | POA: Diagnosis not present

## 2020-06-07 DIAGNOSIS — G629 Polyneuropathy, unspecified: Secondary | ICD-10-CM | POA: Diagnosis not present

## 2020-06-07 DIAGNOSIS — M79661 Pain in right lower leg: Secondary | ICD-10-CM | POA: Diagnosis not present

## 2020-06-07 DIAGNOSIS — Z79891 Long term (current) use of opiate analgesic: Secondary | ICD-10-CM | POA: Diagnosis not present

## 2020-07-03 ENCOUNTER — Ambulatory Visit: Payer: PPO | Admitting: Student

## 2020-07-03 ENCOUNTER — Encounter: Payer: Self-pay | Admitting: Student

## 2020-07-03 ENCOUNTER — Other Ambulatory Visit: Payer: Self-pay

## 2020-07-03 VITALS — BP 120/58 | HR 60 | Ht 68.0 in | Wt 166.2 lb

## 2020-07-03 DIAGNOSIS — M79604 Pain in right leg: Secondary | ICD-10-CM | POA: Diagnosis not present

## 2020-07-03 DIAGNOSIS — M79605 Pain in left leg: Secondary | ICD-10-CM | POA: Diagnosis not present

## 2020-07-03 DIAGNOSIS — M79621 Pain in right upper arm: Secondary | ICD-10-CM | POA: Diagnosis not present

## 2020-07-03 DIAGNOSIS — Z6826 Body mass index (BMI) 26.0-26.9, adult: Secondary | ICD-10-CM | POA: Diagnosis not present

## 2020-07-03 DIAGNOSIS — G894 Chronic pain syndrome: Secondary | ICD-10-CM | POA: Diagnosis not present

## 2020-07-03 DIAGNOSIS — I255 Ischemic cardiomyopathy: Secondary | ICD-10-CM | POA: Diagnosis not present

## 2020-07-03 DIAGNOSIS — I2510 Atherosclerotic heart disease of native coronary artery without angina pectoris: Secondary | ICD-10-CM

## 2020-07-03 DIAGNOSIS — G629 Polyneuropathy, unspecified: Secondary | ICD-10-CM | POA: Diagnosis not present

## 2020-07-03 DIAGNOSIS — M542 Cervicalgia: Secondary | ICD-10-CM | POA: Diagnosis not present

## 2020-07-03 DIAGNOSIS — R7303 Prediabetes: Secondary | ICD-10-CM | POA: Diagnosis not present

## 2020-07-03 DIAGNOSIS — Z79891 Long term (current) use of opiate analgesic: Secondary | ICD-10-CM | POA: Diagnosis not present

## 2020-07-03 DIAGNOSIS — F1120 Opioid dependence, uncomplicated: Secondary | ICD-10-CM | POA: Diagnosis not present

## 2020-07-03 DIAGNOSIS — M545 Low back pain: Secondary | ICD-10-CM | POA: Diagnosis not present

## 2020-07-03 DIAGNOSIS — I513 Intracardiac thrombosis, not elsewhere classified: Secondary | ICD-10-CM

## 2020-07-03 DIAGNOSIS — M79662 Pain in left lower leg: Secondary | ICD-10-CM | POA: Diagnosis not present

## 2020-07-03 DIAGNOSIS — E785 Hyperlipidemia, unspecified: Secondary | ICD-10-CM

## 2020-07-03 DIAGNOSIS — M79622 Pain in left upper arm: Secondary | ICD-10-CM | POA: Diagnosis not present

## 2020-07-03 DIAGNOSIS — M79661 Pain in right lower leg: Secondary | ICD-10-CM | POA: Diagnosis not present

## 2020-07-03 DIAGNOSIS — G8929 Other chronic pain: Secondary | ICD-10-CM | POA: Diagnosis not present

## 2020-07-03 NOTE — Patient Instructions (Signed)
Medication Instructions:  Your physician recommends that you continue on your current medications as directed. Please refer to the Current Medication list given to you today.  *If you need a refill on your cardiac medications before your next appointment, please call your pharmacy*   Lab Work: Your physician recommends that you return for lab work in: Fasting   If you have labs (blood work) drawn today and your tests are completely normal, you will receive your results only by:  [place] (if you have [place]) OR  A paper copy in the mail If you have any lab test that is abnormal or we need to change your treatment, we will call you to review the results.   Testing/Procedures: Your physician has requested that you have an echocardiogram. Echocardiography is a painless test that uses sound waves to create images of your heart. It provides your doctor with information about the size and shape of your heart and how well your hearts chambers and valves are working. This procedure takes approximately one hour. There are no restrictions for this procedure.  Follow-Up: At [facility], you and your health needs are our priority.  As part of our continuing mission to provide you with exceptional heart care, we have created designated Provider Care Teams.  These Care Teams include your primary Cardiologist (physician) and Advanced Practice Providers (APPs -  Physician Assistants and Nurse Practitioners) who all work together to provide you with the care you need, when you need it.  We recommend signing up for the patient portal called "MyChart".  Sign up information is provided on this After Visit Summary.  MyChart is used to connect with patients for Virtual Visits (Telemedicine).  Patients are able to view lab/test results, encounter notes, upcoming appointments, etc.  Non-urgent messages can be sent to your provider as well.   To learn more about what you can do with MyChart, go to  [email or web address].    Your next appointment:   3 month(s)  The format for your next appointment:   In Person  Provider:   Carlyle Dolly, MD or Bernerd Pho, PA-C   Other Instructions Thank you for choosing [facility]!

## 2020-07-03 NOTE — Progress Notes (Signed)
Cardiology Office Note    Date:  07/03/2020   ID:  HERO KULISH, DOB 1956/02/20, MRN [id number]  PCP:  Nolene Ebbs, MD  Cardiologist: Carlyle Dolly, MD    Chief Complaint  Patient presents with  . Follow-up    2 month visit    History of Present Illness:    Jim Martin is a 64 y.o. male past medical history of CAD (s/p NSTEMI in 04/2020 with DES x2 to LAD and residual 70% distal LAD stenosis), chronic systolic CHF/ICM (EF 78-29% by echo in 04/2020), apical thrombus (by echo in 04/2020) HTN, HLD, tobacco use and chronic back pain who presents to the office today for 81-month follow-up.  He was last examined by Ermalinda Barrios, PA-C in 04/2020 following his recent admission and denied any recent chest pain or dyspnea at that time. He was walking a lot while at work and had quit smoking. It was recommended to stop ASA 30 days following stent placement given the need for Plavix and Eliquis. He was continued on Toprol-XL but was not able to be started on an ACE-I or ARB at that time given his soft BP. A follow-up echocardiogram was ordered for reassessment of his EF in 3 months following revascularization.   In talking with the patient today, he reports "feeling great" since his last office visit. 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 He denies any recurrent chest pain or dyspnea on exertion.  No recent orthopnea, PND, lower extremity edema or palpitations. He does report some fatigue in the evening hours and questions if this is due to his medications.  He had previously quit smoking at the time of his last office visit and denies any recurrent tobacco use.    Past Medical History:  Diagnosis Date  . Anxiety   . 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 CAD (coronary artery disease)    a. s/p NSTEMI in 04/2020 with DES x2 to  LAD and residual 70% distal LAD stenosis  . Chronic low back pain   . Coronary artery disease   . Hypertension   . Ischemic cardiomyopathy    a. EF 35-40% by echo in 04/2020 with apical thrombus noted    Past Surgical History:  Procedure Laterality Date  . BRAIN SURGERY    . COLONOSCOPY  11/2009   Dr. Oletta Lamas: medium-sized internal hemorrhoids, normal   . CORONARY STENT INTERVENTION N/A 04/16/2020   Procedure: CORONARY STENT INTERVENTION;  Surgeon: Troy Sine, MD;  Location: [facility];  Service: Cardiovascular;  Laterality: N/A;  . LEFT HEART CATH AND CORONARY ANGIOGRAPHY N/A 04/16/2020   Procedure: LEFT HEART CATH AND CORONARY ANGIOGRAPHY;  Surgeon: Troy Sine, MD;  Location: [facility];  Service: Cardiovascular;  Laterality: N/A;  . Lumbosacral spine surgery    . SPINAL CORD STIMULATOR IMPLANT      Current Medications: Outpatient Medications Prior to Visit  Medication Sig Dispense Refill  . apixaban (ELIQUIS) 5 MG TABS tablet Take 1 tablet (5 mg total) by mouth 2 (two) times daily. 180 tablet 3  . atorvastatin (LIPITOR) 80 MG tablet Take 1 tablet (80 mg total) by mouth daily. 90 tablet 3  . clopidogrel (PLAVIX) 75 MG tablet Take 1 tablet (75 mg total) by mouth daily. 90 tablet 3  . DULoxetine (CYMBALTA) 30 MG  capsule Take 30 mg by mouth daily.    . famotidine (PEPCID) 20 MG tablet Take 1 tablet (20 mg total) by mouth 2 (two) times daily. 30 tablet 0  . folic acid (FOLVITE) 1 MG tablet Take 1 mg by mouth daily.    Marland Kitchen gabapentin (NEURONTIN) 300 MG capsule Take 300 mg by mouth 3 (three) times daily.    . methocarbamol (ROBAXIN) 750 MG tablet Take 750 mg by mouth 3 (three) times daily.    . metoprolol succinate (TOPROL-XL) 25 MG 24 hr tablet Take 1 tablet (25 mg total) by mouth daily. 90 tablet 3  . nitroGLYCERIN (NITROSTAT) 0.4 MG SL tablet Place 1 tablet (0.4 mg total) under the tongue every 5 (five) minutes as needed for chest pain (CP or SOB). 25 tablet 3  .  oxyCODONE (ROXICODONE) 15 MG immediate release tablet Take 15 mg by mouth 5 (five) times daily.   0  . pantoprazole (PROTONIX) 40 MG tablet Take 40 mg by mouth daily.    Marland Kitchen topiramate (TOPAMAX) 100 MG tablet TAKE ONE TABLET BY MOUTH AT BEDTIME. 90 tablet 3  . zolpidem (AMBIEN) 10 MG tablet Take 10 mg by mouth at bedtime as needed for sleep.    Marland Kitchen aspirin EC 81 MG tablet Take 81 mg by mouth daily.     No facility-administered medications prior to visit.     Allergies:   Patient has no known allergies.   Social History   Socioeconomic History  . Marital status: Divorced    Spouse name: Not on file  . Number of children: 1  . Years of education: 8th  . Highest education level: Not on file  Occupational History  . Occupation: disabilty  Tobacco Use  . Smoking status: Former Smoker    Packs/day: 1.50    Types: Cigarettes    Quit date: 04/16/2020    Years since quitting: 0.2  . Smokeless tobacco: Never Used  Vaping Use  . Vaping Use: Never used  Substance and Sexual Activity  . Alcohol use: No    Alcohol/week: 0.0 standard drinks  . Drug use: No  . Sexual activity: Not on file  Other Topics Concern  . Not on file  Social History Narrative   Patient is single with one child.   Patient is left handed.   Patient has an 8 th grade education.   Patient drinks 5 or more sodas daily.   Social Determinants of Health   Financial Resource Strain:   . Difficulty of Paying Living Expenses:   Food Insecurity:   . Worried About Charity fundraiser in the Last Year:   . Arboriculturist in the Last Year:   Transportation Needs:   . Film/video editor (Medical):   Marland Kitchen Lack of Transportation (Non-Medical):   Physical Activity:   . Days of Exercise per Week:   . Minutes of Exercise per Session:   Stress:   . Feeling of Stress :   Social Connections:   . Frequency of Communication with Friends and Family:   . Frequency of Social Gatherings with Friends and Family:   . Attends  Religious Services:   . Active Member of Clubs or Organizations:   . Attends Archivist Meetings:   Marland Kitchen Marital Status:      Family History:  The patient's family history includes Cancer in his mother; Heart attack in his brother and father.   Review of Systems:   Please see the history of  present illness.     General:  No chills, fever, night sweats or weight changes. Positive for fatigue.  Cardiovascular:  No chest pain, dyspnea on exertion, edema, orthopnea, palpitations, paroxysmal nocturnal dyspnea. Dermatological: No rash, lesions/masses Respiratory: No cough, dyspnea Urologic: No hematuria, dysuria Abdominal:   No nausea, vomiting, diarrhea, bright red blood per rectum, melena, or hematemesis Neurologic:  No visual changes, wkns, changes in mental status. All other systems reviewed and are otherwise negative except as noted above.   Physical Exam:    VS:  BP (!) 120/58   Pulse 60   Ht 5\' 8"  (1.727 m)   Wt 166 lb 3.2 oz (75.4 kg)   SpO2 99%   BMI 25.27 kg/m    General: Well developed, well nourished,male appearing in no acute distress. Head: Normocephalic, atraumatic, sclera non-icteric.  Neck: No carotid bruits. JVD not elevated.  Lungs: Respirations regular and unlabored, without wheezes or rales.  Heart: Regular rate and rhythm. No S3 or S4.  No murmur, no rubs, or gallops appreciated. Abdomen: Soft, non-tender, non-distended. No obvious abdominal masses. Msk:  Strength and tone appear normal for age. No obvious joint deformities or effusions. Extremities: No clubbing or cyanosis. No lower extremity edema.  Distal pedal pulses are 2+ bilaterally. Neuro: Alert and oriented X 3. Moves all extremities spontaneously. No focal deficits noted. Psych:  Responds to questions appropriately with a normal affect. Skin: No rashes or lesions noted  Wt Readings from Last 3 Encounters:  07/03/20 166 lb 3.2 oz (75.4 kg)  05/01/20 166 lb 9.6 oz (75.6 kg)  04/17/20 154 lb  10.8 oz (70.2 kg)     Studies/Labs Reviewed:   EKG:  EKG is not ordered today.   Recent Labs: 04/16/2020: Magnesium 1.9 04/17/2020: ALT 17; BUN 10; Creatinine, Ser 0.84; Hemoglobin 13.0; Platelets 233; Potassium 3.6; Sodium 138   Lipid Panel    Component Value Date/Time   CHOL 144 04/17/2020 0331   TRIG 93 04/17/2020 0331   HDL 28 (L) 04/17/2020 0331   CHOLHDL 5.1 04/17/2020 0331   VLDL 19 04/17/2020 0331   LDLCALC 97 04/17/2020 0331    Additional studies/ records that were reviewed today include:   Echocardiogram: 04/2020 IMPRESSIONS    1. LV function is preserved at the base, but the mid to distal anterior,  anteroseptal, anterolateral, and distal inferior/inferolateral walls are  hypokinteic. The apex and anteroseptal/anterior apical regions are  akinetic. There is a prominent apical LV  thrombus seen both with and without contrast, measureing approximately 1  cm x 1.8 cm.. Left ventricular ejection fraction, by estimation, is 35 to  40%. The left ventricle has moderately decreased function. The left  ventricle demonstrates regional wall  motion abnormalities (see scoring diagram/findings for description). Left  ventricular diastolic parameters are indeterminate.  2. Right ventricular systolic function is low normal. The right  ventricular size is normal.  3. The mitral valve is normal in structure. No evidence of mitral valve  regurgitation. No evidence of mitral stenosis.  4. The aortic valve is grossly normal. Aortic valve regurgitation is not  visualized. No aortic stenosis is present.  5. The inferior vena cava is normal in size with greater than 50%  respiratory variability, suggesting right atrial pressure of 3 mmHg.  6. Cannot exclude intraatrial shunt.   Cardiac Catheterization: 04/2020  Mid LAD-1 lesion is 75% stenosed.  Mid LAD-2 lesion is 99% stenosed.  Mid LAD to Dist LAD lesion is 95% stenosed.  Dist LAD lesion is 70%  stenosed.  Post  intervention, there is a 0% residual stenosis.  Post intervention, there is a 0% residual stenosis.  A stent was successfully placed.  Post intervention, there is a 0% residual stenosis.   Acute coronary syndrome/non-STEMI secondary to 3 tandem stenoses in a large LAD system with 70% proximal at site of ulcerated plaque, 99% in the proximal to mid and 95% eccentric mid stenosis, and 70% smooth distal LAD stenosis; relatively normal left circumflex system; RCA with 70% proximal and 20% mid stenoses.  LVEDP 21 mmHg.  Successful percutaneous coronary intervention of the tandem LAD lesions with ultimate insertion of a 2.75 x 38 mm stent from distal to proximal and 3.0 x 12 mm Resolute Onyx DES stent with stent overlap to cover the most proximal stenosis.  All stenoses were reduced to 0%.  There was brisk TIMI-3 flow.  There was no change in the distal 70% LAD stenosis.  RECOMMENDATION: DAPT for minimum of 1 year.  Smoking cessation is essential.  Aggressive lipid intervention with target LDL less than 70.  Optimal blood pressure control.  An echo Doppler study will be done to assess LV function.   Assessment:    1. Coronary artery disease involving native coronary artery of native heart without angina pectoris   2. Ischemic cardiomyopathy   3. Left ventricular apical thrombus   4. Hyperlipidemia LDL goal <70      Plan:   In order of problems listed above:  1. CAD - He is s/p NSTEMI in 04/2020 with DES x2 to LAD and residual 70% distal LAD stenosis. He denies any recent chest pain or dyspnea on exertion.  - Continue Plavix 75 mg daily, Atorvastatin 80 mg daily and Toprol-XL 25 mg daily. He is no longer on ASA given the need for anticoagulation. If Eliquis can be discontinued based off his echocardiogram results, would recommend resuming ASA 81 mg daily given recent stent placement.  2. Ischemic Cardiomyopathy - EF was reduced at 35 to 40% by echocardiogram in 04/2020. He denies any  recent orthopnea, PND or lower extremity edema. Appears euvolemic by examination today. He remains on Toprol-XL 25 mg daily. He has not been on an ACE-I/ARB/ARNI due to soft BP. His blood pressure is actually improved at 120/58 during today's visit. Given that he is scheduled for his echocardiogram next week, he wishes to wait for reassessment of his EF prior to adjusting medications. If EF remains reduced, would plan to add Losartan 12.5 mg daily. I doubt his BP would allow for the addition of Entresto as SBP has been in the low-100's at times.   3. Apical Thrombus - Echocardiogram in 04/2020 showed a prominent apical LV thrombus as outlined above. He remains on Eliquis 5 mg twice daily for anticoagulation and denies any evidence of active bleeding. He is scheduled for a repeat echocardiogram next week.  4. HLD - LDL was elevated to 97 when checked in 04/2020.  He remains on Atorvastatin 80 mg daily. Will plan for upcoming FLP and LFT's when here for his echocardiogram.    Medication Adjustments/Labs and Tests Ordered: Current medicines are reviewed at length with the patient today.  Concerns regarding medicines are outlined above.  Medication changes, Labs and Tests ordered today are listed in the Patient Instructions below. Patient Instructions  Medication Instructions:  Your physician recommends that you continue on your current medications as directed. Please refer to the Current Medication list given to you today.  *If you need a refill on your  cardiac medications before your next appointment, please call your pharmacy*   Lab Work: Your physician recommends that you return for lab work in: Fasting   If you have labs (blood work) drawn today and your tests are completely normal, you will receive your results only by: Marland Kitchen MyChart Message (if you have MyChart) OR . A paper copy in the mail If you have any lab test that is abnormal or we need to change your treatment, we will call you to  review the results.   Testing/Procedures: Your physician has requested that you have an echocardiogram. Echocardiography is a painless test that uses sound waves to create images of your heart. It provides your doctor with information about the size and shape of your heart and how well your heart's chambers and valves are working. This procedure takes approximately one hour. There are no restrictions for this procedure.  Follow-Up: At [facility], you and your health needs are our priority.  As part of our continuing mission to provide you with exceptional heart care, we have created designated Provider Care Teams.  These Care Teams include your primary Cardiologist (physician) and Advanced Practice Providers (APPs -  Physician Assistants and Nurse Practitioners) who all work together to provide you with the care you need, when you need it.  We recommend signing up for the patient portal called "MyChart".  Sign up information is provided on this After Visit Summary.  MyChart is used to connect with patients for Virtual Visits (Telemedicine).  Patients are able to view lab/test results, encounter notes, upcoming appointments, etc.  Non-urgent messages can be sent to your provider as well.   To learn more about what you can do with MyChart, go to [email or web address].    Your next appointment:   3 month(s)  The format for your next appointment:   In Person  Provider:   Carlyle Dolly, MD or Bernerd Pho, PA-C   Other Instructions Thank you for choosing [facility]!     Signed, Erma Heritage, PA-C  07/03/2020 4:57 PM    [facility] [address] [place], [place] [address] Phone: [phone] Fax: [phone]

## 2020-07-13 ENCOUNTER — Ambulatory Visit (HOSPITAL_COMMUNITY)
Admission: RE | Admit: 2020-07-13 | Discharge: 2020-07-13 | Disposition: A | Discharge status: Home / Self Care | Payer: PPO | Source: Ambulatory Visit | Attending: Physician Assistant | Admitting: Physician Assistant

## 2020-07-13 ENCOUNTER — Other Ambulatory Visit: Payer: Self-pay

## 2020-07-13 ENCOUNTER — Other Ambulatory Visit (HOSPITAL_COMMUNITY)
Admission: RE | Admit: 2020-07-13 | Discharge: 2020-07-13 | Disposition: A | Discharge status: Home / Self Care | Payer: PPO | Source: Ambulatory Visit

## 2020-07-13 DIAGNOSIS — I513 Intracardiac thrombosis, not elsewhere classified: Secondary | ICD-10-CM | POA: Insufficient documentation

## 2020-07-13 DIAGNOSIS — E785 Hyperlipidemia, unspecified: Secondary | ICD-10-CM | POA: Diagnosis not present

## 2020-07-13 LAB — LIPID PANEL
Cholesterol: 103 mg/dL (ref 0–200)
HDL: 35 mg/dL — ABNORMAL LOW (ref >40)
LDL Cholesterol: 49 mg/dL (ref 0–99)
Total CHOL/HDL Ratio: 2.9 RATIO
Triglycerides: 94 mg/dL (ref <150)
VLDL: 19 mg/dL (ref 0–40)

## 2020-07-13 LAB — HEPATIC FUNCTION PANEL
ALT: 31 U/L (ref 0–44)
AST: 22 U/L (ref 15–41)
Albumin: 3.9 g/dL (ref 3.5–5.0)
Alkaline Phosphatase: 102 U/L (ref 38–126)
Bilirubin, Direct: <0.1 mg/dL (ref 0.0–0.2)
Total Bilirubin: 0.6 mg/dL (ref 0.3–1.2)
Total Protein: 6.5 g/dL (ref 6.5–8.1)

## 2020-07-13 LAB — ECHOCARDIOGRAM COMPLETE
Area-P 1/2: 1.67 cm2
S' Lateral: 3.73 cm

## 2020-07-13 MED ORDER — PERFLUTREN LIPID MICROSPHERE
1.0000 mL | INTRAVENOUS | Status: DC | PRN
  Administered 2020-07-13: 1 mL via INTRAVENOUS
  Filled 2020-07-13: qty 10

## 2020-07-13 MED ORDER — PERFLUTREN LIPID MICROSPHERE
1.0000 mL | INTRAVENOUS | Status: DC | PRN
  Filled 2020-07-13: qty 10

## 2020-07-13 NOTE — Progress Notes (Signed)
*  PRELIMINARY RESULTS* Echocardiogram 2D Echocardiogram with definity has been performed.  Leavy Cella 07/13/2020, 2:25 PM

## 2020-07-31 DIAGNOSIS — G8929 Other chronic pain: Secondary | ICD-10-CM | POA: Diagnosis not present

## 2020-07-31 DIAGNOSIS — M542 Cervicalgia: Secondary | ICD-10-CM | POA: Diagnosis not present

## 2020-07-31 DIAGNOSIS — M79604 Pain in right leg: Secondary | ICD-10-CM | POA: Diagnosis not present

## 2020-07-31 DIAGNOSIS — M79605 Pain in left leg: Secondary | ICD-10-CM | POA: Diagnosis not present

## 2020-07-31 DIAGNOSIS — M545 Low back pain: Secondary | ICD-10-CM | POA: Diagnosis not present

## 2020-07-31 DIAGNOSIS — G629 Polyneuropathy, unspecified: Secondary | ICD-10-CM | POA: Diagnosis not present

## 2020-07-31 DIAGNOSIS — M79622 Pain in left upper arm: Secondary | ICD-10-CM | POA: Diagnosis not present

## 2020-07-31 DIAGNOSIS — M79621 Pain in right upper arm: Secondary | ICD-10-CM | POA: Diagnosis not present

## 2020-07-31 DIAGNOSIS — R7309 Other abnormal glucose: Secondary | ICD-10-CM | POA: Diagnosis not present

## 2020-07-31 DIAGNOSIS — Z6826 Body mass index (BMI) 26.0-26.9, adult: Secondary | ICD-10-CM | POA: Diagnosis not present

## 2020-07-31 DIAGNOSIS — G894 Chronic pain syndrome: Secondary | ICD-10-CM | POA: Diagnosis not present

## 2020-07-31 DIAGNOSIS — M79661 Pain in right lower leg: Secondary | ICD-10-CM | POA: Diagnosis not present

## 2020-07-31 DIAGNOSIS — Z79891 Long term (current) use of opiate analgesic: Secondary | ICD-10-CM | POA: Diagnosis not present

## 2020-07-31 DIAGNOSIS — F1120 Opioid dependence, uncomplicated: Secondary | ICD-10-CM | POA: Diagnosis not present

## 2020-07-31 DIAGNOSIS — M79662 Pain in left lower leg: Secondary | ICD-10-CM | POA: Diagnosis not present

## 2020-08-28 DIAGNOSIS — M79605 Pain in left leg: Secondary | ICD-10-CM | POA: Diagnosis not present

## 2020-08-28 DIAGNOSIS — M542 Cervicalgia: Secondary | ICD-10-CM | POA: Diagnosis not present

## 2020-08-28 DIAGNOSIS — M79621 Pain in right upper arm: Secondary | ICD-10-CM | POA: Diagnosis not present

## 2020-08-28 DIAGNOSIS — Z6826 Body mass index (BMI) 26.0-26.9, adult: Secondary | ICD-10-CM | POA: Diagnosis not present

## 2020-08-28 DIAGNOSIS — F1120 Opioid dependence, uncomplicated: Secondary | ICD-10-CM | POA: Diagnosis not present

## 2020-08-28 DIAGNOSIS — M79622 Pain in left upper arm: Secondary | ICD-10-CM | POA: Diagnosis not present

## 2020-08-28 DIAGNOSIS — G894 Chronic pain syndrome: Secondary | ICD-10-CM | POA: Diagnosis not present

## 2020-08-28 DIAGNOSIS — M79604 Pain in right leg: Secondary | ICD-10-CM | POA: Diagnosis not present

## 2020-08-28 DIAGNOSIS — M79661 Pain in right lower leg: Secondary | ICD-10-CM | POA: Diagnosis not present

## 2020-08-28 DIAGNOSIS — G8929 Other chronic pain: Secondary | ICD-10-CM | POA: Diagnosis not present

## 2020-08-28 DIAGNOSIS — R7303 Prediabetes: Secondary | ICD-10-CM | POA: Diagnosis not present

## 2020-08-28 DIAGNOSIS — M545 Low back pain: Secondary | ICD-10-CM | POA: Diagnosis not present

## 2020-08-28 DIAGNOSIS — G629 Polyneuropathy, unspecified: Secondary | ICD-10-CM | POA: Diagnosis not present

## 2020-08-28 DIAGNOSIS — M79662 Pain in left lower leg: Secondary | ICD-10-CM | POA: Diagnosis not present

## 2020-08-28 DIAGNOSIS — Z79891 Long term (current) use of opiate analgesic: Secondary | ICD-10-CM | POA: Diagnosis not present

## 2020-09-04 ENCOUNTER — Encounter: Payer: Self-pay | Admitting: Adult Health

## 2020-09-04 ENCOUNTER — Ambulatory Visit: Payer: PPO | Admitting: Adult Health

## 2020-09-04 VITALS — BP 132/80 | HR 58 | Ht 68.0 in | Wt 171.4 lb

## 2020-09-04 DIAGNOSIS — G43109 Migraine with aura, not intractable, without status migrainosus: Secondary | ICD-10-CM

## 2020-09-04 MED ORDER — TOPIRAMATE 100 MG PO TABS: 100.0000 mg | ORAL_TABLET | Freq: Every day | ORAL | 3 refills | Status: DC

## 2020-09-04 NOTE — Progress Notes (Signed)
I have read the note, and I agree with the clinical assessment and plan.  Robyn Nohr K Aquanetta Schwarz   

## 2020-09-04 NOTE — Patient Instructions (Signed)
Your Plan:  Continue Topamax If your symptoms worsen or you develop new symptoms please let us know.   Thank you for coming to see us at [facility]. I hope we have been able to provide you high quality care today.  You may receive a patient satisfaction survey over the next few weeks. We would appreciate your feedback and comments so that we may continue to improve ourselves and the health of our patients.  

## 2020-09-04 NOTE — Progress Notes (Signed)
PATIENT: Jim Martin DOB: 1956-10-15  REASON FOR VISIT: follow up HISTORY FROM: patient  HISTORY OF PRESENT ILLNESS: Today 09/04/20: Mr. Petrovic is a 64 year old male with a history of basilar migraines.  He returns today for follow-up.  He remains on Topamax 100 mg at bedtime.  He denies any true migraine headaches.  He reports that in May he suffered a heart attack.  Reports that his 2 stents had to be placed.  Since then he has quit smoking and started exercising.  He returns today for an evaluation.  HISTORY 09/05/19:  Mr. Welton is a 64 year old male with a history of basilar migraines.  He returns today for follow-up.  He remains on Topamax 100 mg at bedtime.  He reports that this is working well for him.  He states that is been quite a while since he had a headache.  He still states that looking up will sometimes induce his headaches.  He is currently on oxycodone due to chronic neck and low back pain.  This is not managed by our office.  He returns today for an evaluation.   REVIEW OF SYSTEMS: Out of a complete 14 system review of symptoms, the patient complains only of the following symptoms, and all other reviewed systems are negative.  See HPI  ALLERGIES: No Known Allergies  HOME MEDICATIONS: Outpatient Medications Prior to Visit  Medication Sig Dispense Refill  . apixaban (ELIQUIS) 5 MG TABS tablet Take 1 tablet (5 mg total) by mouth 2 (two) times daily. 180 tablet 3  . atorvastatin (LIPITOR) 80 MG tablet Take 1 tablet (80 mg total) by mouth daily. 90 tablet 3  . clopidogrel (PLAVIX) 75 MG tablet Take 1 tablet (75 mg total) by mouth daily. 90 tablet 3  . DULoxetine (CYMBALTA) 30 MG capsule Take 30 mg by mouth daily.    . famotidine (PEPCID) 20 MG tablet Take 1 tablet (20 mg total) by mouth 2 (two) times daily. 30 tablet 0  . folic acid (FOLVITE) 1 MG tablet Take 1 mg by mouth daily.    Marland Kitchen gabapentin (NEURONTIN) 300 MG capsule Take 300 mg by mouth 3 (three) times daily.     . methocarbamol (ROBAXIN) 750 MG tablet Take 750 mg by mouth 3 (three) times daily.    . metoprolol succinate (TOPROL-XL) 25 MG 24 hr tablet Take 1 tablet (25 mg total) by mouth daily. 90 tablet 3  . nitroGLYCERIN (NITROSTAT) 0.4 MG SL tablet Place 1 tablet (0.4 mg total) under the tongue every 5 (five) minutes as needed for chest pain (CP or SOB). 25 tablet 3  . oxyCODONE (ROXICODONE) 15 MG immediate release tablet Take 15 mg by mouth 5 (five) times daily.   0  . pantoprazole (PROTONIX) 40 MG tablet Take 40 mg by mouth daily.    Marland Kitchen topiramate (TOPAMAX) 100 MG tablet TAKE ONE TABLET BY MOUTH AT BEDTIME. 90 tablet 3  . zolpidem (AMBIEN) 10 MG tablet Take 10 mg by mouth at bedtime as needed for sleep.     No facility-administered medications prior to visit.    PAST MEDICAL HISTORY: Past Medical History:  Diagnosis Date  . Anxiety   . 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 CAD (coronary artery disease)    a. s/p NSTEMI in 04/2020 with DES x2 to LAD and residual 70% distal LAD stenosis  . Chronic low back pain   . Coronary artery disease   . Hypertension   . Ischemic cardiomyopathy    a. EF  35-40% by echo in 04/2020 with apical thrombus noted    PAST SURGICAL HISTORY: Past Surgical History:  Procedure Laterality Date  . BRAIN SURGERY    . COLONOSCOPY  11/2009   Dr. Oletta Lamas: medium-sized internal hemorrhoids, normal   . CORONARY STENT INTERVENTION N/A 04/16/2020   Procedure: CORONARY STENT INTERVENTION;  Surgeon: Troy Sine, MD;  Location: [facility];  Service: Cardiovascular;  Laterality: N/A;  . LEFT HEART CATH AND CORONARY ANGIOGRAPHY N/A 04/16/2020   Procedure: LEFT HEART CATH AND CORONARY ANGIOGRAPHY;  Surgeon: Troy Sine, MD;  Location: [facility];  Service: Cardiovascular;  Laterality: N/A;  . Lumbosacral spine surgery    . SPINAL CORD STIMULATOR IMPLANT      FAMILY HISTORY: Family History  Problem Relation Age of Onset  . Cancer Mother   . Heart attack  Father   . Heart attack Brother   . Colon cancer Neg Hx     SOCIAL HISTORY: Social History   Socioeconomic History  . Marital status: Divorced    Spouse name: Not on file  . Number of children: 1  . Years of education: 8th  . Highest education level: Not on file  Occupational History  . Occupation: disabilty  Tobacco Use  . Smoking status: Former Smoker    Packs/day: 1.50    Types: Cigarettes    Quit date: 04/16/2020    Years since quitting: 0.3  . Smokeless tobacco: Never Used  Vaping Use  . Vaping Use: Never used  Substance and Sexual Activity  . Alcohol use: No    Alcohol/week: 0.0 standard drinks  . Drug use: No  . Sexual activity: Not on file  Other Topics Concern  . Not on file  Social History Narrative   Patient is single with one child.   Patient is left handed.   Patient has an 8 th grade education.   Patient drinks 5 or more sodas daily.   Social Determinants of Health   Financial Resource Strain:   . Difficulty of Paying Living Expenses: Not on file  Food Insecurity:   . Worried About Charity fundraiser in the Last Year: Not on file  . Ran Out of Food in the Last Year: Not on file  Transportation Needs:   . Lack of Transportation (Medical): Not on file  . Lack of Transportation (Non-Medical): Not on file  Physical Activity:   . Days of Exercise per Week: Not on file  . Minutes of Exercise per Session: Not on file  Stress:   . Feeling of Stress : Not on file  Social Connections:   . Frequency of Communication with Friends and Family: Not on file  . Frequency of Social Gatherings with Friends and Family: Not on file  . Attends Religious Services: Not on file  . Active Member of Clubs or Organizations: Not on file  . Attends Archivist Meetings: Not on file  . Marital Status: Not on file  Intimate Partner Violence:   . Fear of Current or Ex-Partner: Not on file  . Emotionally Abused: Not on file  . Physically Abused: Not on file  .  Sexually Abused: Not on file      PHYSICAL EXAM  Vitals:   09/04/20 0818  BP: 132/80  Pulse: (!) 58  Weight: 171 lb 6.4 oz (77.7 kg)  Height: 5\' 8"  (1.727 m)   Body mass index is 26.06 kg/m.  Generalized: Well developed, in no acute distress  Neurological examination  Mentation: Alert oriented to time, place, history taking. Follows all commands speech and language fluent Cranial nerve II-XII: Pupils were equal round reactive to light. Extraocular movements were full, visual field were full on confrontational test.  Head turning and shoulder shrug  were normal and symmetric. Motor: The motor testing reveals 5 over 5 strength of all 4 extremities. Good symmetric motor tone is noted throughout.  Sensory: Sensory testing is intact to soft touch on all 4 extremities. No evidence of extinction is noted.  Coordination: Cerebellar testing reveals good finger-nose-finger and heel-to-shin bilaterally.  Gait and station: Gait is normal.  Reflexes: Deep tendon reflexes are symmetric and normal bilaterally.   DIAGNOSTIC DATA (LABS, IMAGING, TESTING) - I reviewed patient records, labs, notes, testing and imaging myself where available.  Lab Results  Component Value Date   WBC 12.2 (H) 04/17/2020   HGB 13.0 04/17/2020   HCT 39.2 04/17/2020   MCV 94.9 04/17/2020   PLT 233 04/17/2020      Component Value Date/Time   NA 138 04/17/2020 0331   K 3.6 04/17/2020 0331   CL 109 04/17/2020 0331   CO2 20 (L) 04/17/2020 0331   GLUCOSE 106 (H) 04/17/2020 0331   BUN 10 04/17/2020 0331   CREATININE 0.84 04/17/2020 0331   CALCIUM 8.7 (L) 04/17/2020 0331   PROT 6.5 07/13/2020 1242   ALBUMIN 3.9 07/13/2020 1242   AST 22 07/13/2020 1242   ALT 31 07/13/2020 1242   ALKPHOS 102 07/13/2020 1242   BILITOT 0.6 07/13/2020 1242   GFRNONAA >60 04/17/2020 0331   GFRAA >60 04/17/2020 0331   Lab Results  Component Value Date   CHOL 103 07/13/2020   HDL 35 (L) 07/13/2020   LDLCALC 49 07/13/2020    TRIG 94 07/13/2020   CHOLHDL 2.9 07/13/2020   Lab Results  Component Value Date   HGBA1C 5.2 04/17/2020     ASSESSMENT AND PLAN 64 y.o. year old male  has a past medical history of Anxiety, Basilar migraine (10/05/2013), CAD (coronary artery disease), Chronic low back pain, Coronary artery disease, Hypertension, and Ischemic cardiomyopathy. here with:  1.  Basilar migraines  Continue Topamax 100 mg daily Follow-up in 1 year or sooner if needed   I spent 20 minutes of face-to-face and non-face-to-face time with patient.  This included previsit chart review, lab review, study review, order entry, electronic health record documentation, patient education.  Ward Givens, MSN, NP-C 09/04/2020, 8:35 AM [facility] [address] [place], [place] [address] [phone]

## 2020-09-24 DIAGNOSIS — M179 Osteoarthritis of knee, unspecified: Secondary | ICD-10-CM | POA: Diagnosis not present

## 2020-09-24 DIAGNOSIS — G5600 Carpal tunnel syndrome, unspecified upper limb: Secondary | ICD-10-CM | POA: Diagnosis not present

## 2020-09-24 DIAGNOSIS — I10 Essential (primary) hypertension: Secondary | ICD-10-CM | POA: Diagnosis not present

## 2020-09-24 DIAGNOSIS — Q245 Malformation of coronary vessels: Secondary | ICD-10-CM | POA: Diagnosis not present

## 2020-09-24 DIAGNOSIS — Z7189 Other specified counseling: Secondary | ICD-10-CM | POA: Diagnosis not present

## 2020-09-24 DIAGNOSIS — G894 Chronic pain syndrome: Secondary | ICD-10-CM | POA: Diagnosis not present

## 2020-09-25 DIAGNOSIS — M79661 Pain in right lower leg: Secondary | ICD-10-CM | POA: Diagnosis not present

## 2020-09-25 DIAGNOSIS — M79621 Pain in right upper arm: Secondary | ICD-10-CM | POA: Diagnosis not present

## 2020-09-25 DIAGNOSIS — G894 Chronic pain syndrome: Secondary | ICD-10-CM | POA: Diagnosis not present

## 2020-09-25 DIAGNOSIS — M542 Cervicalgia: Secondary | ICD-10-CM | POA: Diagnosis not present

## 2020-09-25 DIAGNOSIS — M79622 Pain in left upper arm: Secondary | ICD-10-CM | POA: Diagnosis not present

## 2020-09-25 DIAGNOSIS — F1120 Opioid dependence, uncomplicated: Secondary | ICD-10-CM | POA: Diagnosis not present

## 2020-09-25 DIAGNOSIS — G8929 Other chronic pain: Secondary | ICD-10-CM | POA: Diagnosis not present

## 2020-09-25 DIAGNOSIS — M79604 Pain in right leg: Secondary | ICD-10-CM | POA: Diagnosis not present

## 2020-09-25 DIAGNOSIS — M79662 Pain in left lower leg: Secondary | ICD-10-CM | POA: Diagnosis not present

## 2020-09-25 DIAGNOSIS — Z79891 Long term (current) use of opiate analgesic: Secondary | ICD-10-CM | POA: Diagnosis not present

## 2020-09-25 DIAGNOSIS — M79605 Pain in left leg: Secondary | ICD-10-CM | POA: Diagnosis not present

## 2020-10-04 ENCOUNTER — Ambulatory Visit: Payer: PPO | Admitting: Cardiology

## 2020-10-04 ENCOUNTER — Other Ambulatory Visit: Payer: Self-pay

## 2020-10-04 ENCOUNTER — Encounter: Payer: Self-pay | Admitting: Cardiology

## 2020-10-04 VITALS — BP 120/74 | HR 58 | Ht 68.0 in | Wt 175.0 lb

## 2020-10-04 DIAGNOSIS — I5022 Chronic systolic (congestive) heart failure: Secondary | ICD-10-CM | POA: Diagnosis not present

## 2020-10-04 DIAGNOSIS — I513 Intracardiac thrombosis, not elsewhere classified: Secondary | ICD-10-CM

## 2020-10-04 DIAGNOSIS — I2510 Atherosclerotic heart disease of native coronary artery without angina pectoris: Secondary | ICD-10-CM | POA: Diagnosis not present

## 2020-10-04 MED ORDER — ATORVASTATIN CALCIUM 40 MG PO TABS: 40.0000 mg | ORAL_TABLET | Freq: Every day | ORAL | 3 refills | Status: DC

## 2020-10-04 MED ORDER — ASPIRIN EC 81 MG PO TBEC
81.0000 mg | DELAYED_RELEASE_TABLET | Freq: Every day | ORAL | 3 refills | Status: AC
Start: 2020-10-04 — End: ?

## 2020-10-04 NOTE — Progress Notes (Signed)
Clinical Summary Jim Martin is Martin 64 y.o.male seen today for follow up of the following medical problems.   1. CAD - NSTEMI 04/2020, received DES to LAD. Distal LAD 70% treated medically - no recent chest pains. No sob/doe - compliant with meds   2. Chronic systolic HF - echo 03/4009 LVEF 35-40% - 06/2020 echo LVEF 50-55%,   - no SOb/DOE, no LE edmea. - medical therapy limited by intermittent soft bp's  3. Apical thrombus - noted by 04/2020 echo - from 06/2020 no evidence of ongoing thrombus  4. LEg pains - bilateral leg pains and muscle aches, pain swelling in hands.  - of note high dose statin was new during 04/2020 admission   Has not had covid vaccine.   Past Medical History:  Diagnosis Date   Anxiety    Basilar migraine 10/05/2013   CAD (coronary artery disease)    Martin. s/p NSTEMI in 04/2020 with DES x2 to LAD and residual 70% distal LAD stenosis   Chronic low back pain    Coronary artery disease    Hypertension    Ischemic cardiomyopathy    Martin. EF 35-40% by echo in 04/2020 with apical thrombus noted     No Known Allergies   Current Outpatient Medications  Medication Sig Dispense Refill   apixaban (ELIQUIS) 5 MG TABS tablet Take 1 tablet (5 mg total) by mouth 2 (two) times daily. 180 tablet 3   atorvastatin (LIPITOR) 80 MG tablet Take 1 tablet (80 mg total) by mouth daily. 90 tablet 3   clopidogrel (PLAVIX) 75 MG tablet Take 1 tablet (75 mg total) by mouth daily. 90 tablet 3   DULoxetine (CYMBALTA) 30 MG capsule Take 30 mg by mouth daily.     famotidine (PEPCID) 20 MG tablet Take 1 tablet (20 mg total) by mouth 2 (two) times daily. 30 tablet 0   folic acid (FOLVITE) 1 MG tablet Take 1 mg by mouth daily.     gabapentin (NEURONTIN) 300 MG capsule Take 300 mg by mouth 3 (three) times daily.     methocarbamol (ROBAXIN) 750 MG tablet Take 750 mg by mouth 3 (three) times daily.     metoprolol succinate (TOPROL-XL) 25 MG 24 hr tablet Take 1 tablet (25  mg total) by mouth daily. 90 tablet 3   nitroGLYCERIN (NITROSTAT) 0.4 MG SL tablet Place 1 tablet (0.4 mg total) under the tongue every 5 (five) minutes as needed for chest pain (CP or SOB). 25 tablet 3   oxyCODONE (ROXICODONE) 15 MG immediate release tablet Take 15 mg by mouth 5 (five) times daily.   0   pantoprazole (PROTONIX) 40 MG tablet Take 40 mg by mouth daily.     topiramate (TOPAMAX) 100 MG tablet Take 1 tablet (100 mg total) by mouth at bedtime. 90 tablet 3   zolpidem (AMBIEN) 10 MG tablet Take 10 mg by mouth at bedtime as needed for sleep.     No current facility-administered medications for this visit.     Past Surgical History:  Procedure Laterality Date   BRAIN SURGERY     COLONOSCOPY  11/2009   Dr. Oletta Lamas: medium-sized internal hemorrhoids, normal    CORONARY STENT INTERVENTION N/Martin 04/16/2020   Procedure: CORONARY STENT INTERVENTION;  Surgeon: Jim Sine, MD;  Location: [facility];  Service: Cardiovascular;  Laterality: N/Martin;   LEFT HEART CATH AND CORONARY ANGIOGRAPHY N/Martin 04/16/2020   Procedure: LEFT HEART CATH AND CORONARY ANGIOGRAPHY;  Surgeon: Jim Majestic  A, MD;  Location: [facility];  Service: Cardiovascular;  Laterality: N/Martin;   Lumbosacral spine surgery     SPINAL CORD STIMULATOR IMPLANT       No Known Allergies    Family History  Problem Relation Age of Onset   Cancer Mother    Heart attack Father    Heart attack Brother    Colon cancer Neg Hx      Social History Jim Martin reports that he quit smoking about 5 months ago. His smoking use included cigarettes. He smoked 1.50 packs per day. He has never used smokeless tobacco. Jim Martin reports no history of alcohol use.   Review of Systems CONSTITUTIONAL: No weight loss, fever, chills, weakness or fatigue.  HEENT: Eyes: No visual loss, blurred vision, double vision or yellow sclerae.No hearing loss, sneezing, congestion, runny nose or sore throat.  SKIN: No rash or  itching.  CARDIOVASCULAR: per hpi RESPIRATORY: No shortness of breath, cough or sputum.  GASTROINTESTINAL: No anorexia, nausea, vomiting or diarrhea. No abdominal pain or blood.  GENITOURINARY: No burning on urination, no polyuria NEUROLOGICAL: No headache, dizziness, syncope, paralysis, ataxia, numbness or tingling in the extremities. No change in bowel or bladder control.  MUSCULOSKELETAL: No muscle, back pain, joint pain or stiffness.  LYMPHATICS: No enlarged nodes. No history of splenectomy.  PSYCHIATRIC: No history of depression or anxiety.  ENDOCRINOLOGIC: No reports of sweating, cold or heat intolerance. No polyuria or polydipsia.  Marland Kitchen   Physical Examination Today's Vitals   10/04/20 1349  BP: 120/74  Pulse: (!) 58  SpO2: 97%  Weight: 175 lb (79.4 kg)  Height: 5\' 8"  (1.727 m)   Body mass index is 26.61 kg/m.  Gen: resting comfortably, no acute distress HEENT: no scleral icterus, pupils equal round and reactive, no palptable cervical adenopathy,  CV: RRR, no m/r/g, no jvd Resp: Clear to auscultation bilaterally GI: abdomen is soft, non-tender, non-distended, normal bowel sounds, no hepatosplenomegaly MSK: extremities are warm, no edema.  Skin: warm, no rash Neuro:  no focal deficits Psych: appropriate affect   Diagnostic Studies 06/2020 echo IMPRESSIONS    1. Left ventricular ejection fraction, by estimation, is 50 to 55%. The  left ventricle has low normal function. The left ventricle demonstrates  mild global hypokinesis. Left ventricular diastolic parameters are  consistent with Grade I diastolic  dysfunction (impaired relaxation).  2. No definite LV mural thrombus with or without Definity contrast.  3. Right ventricular systolic function is normal. The right ventricular  size is normal. Tricuspid regurgitation signal is inadequate for assessing  PA pressure.  4. Left atrial size was upper normal.  5. The mitral valve is grossly normal. Mild mitral valve  regurgitation.  6. The aortic valve is tricuspid. Aortic valve regurgitation is not  visualized.  7. The inferior vena cava is normal in size with greater than 50%  respiratory variability, suggesting right atrial pressure of 3 mmHg.     Jan 2017 nuclear stress  There was no ST segment deviation noted during stress.  This is Martin low risk study.  The left ventricular ejection fraction is normal (55-65%).  Inferior defect likely secondary to subdiaphragmatic attenuation and gut radiotracer uptake artifact. Cannot rule prior inferior infarct. No evidence of any myocardium currently at jeopardy. Overall findings remain low risk.    04/2020 cath  Mid LAD-1 lesion is 75% stenosed.  Mid LAD-2 lesion is 99% stenosed.  Mid LAD to Dist LAD lesion is 95% stenosed.  Dist LAD lesion  is 70% stenosed.  Post intervention, there is Martin 0% residual stenosis.  Post intervention, there is Martin 0% residual stenosis.  Martin stent was successfully placed.  Post intervention, there is Martin 0% residual stenosis.   Acute coronary syndrome/non-STEMI secondary to 3 tandem stenoses in Martin large LAD system with 70% proximal at site of ulcerated plaque, 99% in the proximal to mid and 95% eccentric mid stenosis, and 70% smooth distal LAD stenosis; relatively normal left circumflex system; RCA with 70% proximal and 20% mid stenoses.  LVEDP 21 mmHg.  Successful percutaneous coronary intervention of the tandem LAD lesions with ultimate insertion of Martin 2.75 x 38 mm stent from distal to proximal and 3.0 x 12 mm Resolute Onyx DES stent with stent overlap to cover the most proximal stenosis.  All stenoses were reduced to 0%.  There was brisk TIMI-3 flow.  There was no change in the distal 70% LAD stenosis.  POST CATH RECOMMENDATION: DAPT for minimum of 1 year.  Smoking cessation is essential.  Aggressive lipid intervention with target LDL less than 70.  Optimal blood pressure control.  An echo Doppler study will be done to  assess LV function.     Assessment and Plan   1. CAD - no recent chest pain - will start ASA along with plavix now that stopping eliquis as reported below - medical therapy limited by interminttent soft bp's, has not been on ACE/ARB. May consider trial low dose in the future.   2. Apical thrombus - resolved by recent echo, we will d/c eliquis  3. Chronic systolic HF - LVEF has since normalized by recent echo - no symptoms - continue current meds  4. Muscle aches - try lowering atorvastatin to 40mg  daily   Arnoldo Lenis, M.D.

## 2020-10-04 NOTE — Patient Instructions (Signed)
Medication Instructions:  STOP Eliquis   START Enteric Coated aspirin 81 mg daily   LOWER Atorvastatin dose to 40 mg daily   *If you need a refill on your cardiac medications before your next appointment, please call your pharmacy*   Lab Work: None today If you have labs (blood work) drawn today and your tests are completely normal, you will receive your results only by: Marland Kitchen MyChart Message (if you have MyChart) OR . A paper copy in the mail If you have any lab test that is abnormal or we need to change your treatment, we will call you to review the results.   Testing/Procedures: None today   Follow-Up: At [facility], you and your health needs are our priority.  As part of our continuing mission to provide you with exceptional heart care, we have created designated Provider Care Teams.  These Care Teams include your primary Cardiologist (physician) and Advanced Practice Providers (APPs -  Physician Assistants and Nurse Practitioners) who all work together to provide you with the care you need, when you need it.  We recommend signing up for the patient portal called "MyChart".  Sign up information is provided on this After Visit Summary.  MyChart is used to connect with patients for Virtual Visits (Telemedicine).  Patients are able to view lab/test results, encounter notes, upcoming appointments, etc.  Non-urgent messages can be sent to your provider as well.   To learn more about what you can do with MyChart, go to [email or web address].    Your next appointment:   6 month(s)  The format for your next appointment:   In Person  Provider:   Carlyle Dolly, MD   Other Instructions None      Thank you for choosing [facility] !

## 2020-10-23 DIAGNOSIS — Z6826 Body mass index (BMI) 26.0-26.9, adult: Secondary | ICD-10-CM | POA: Diagnosis not present

## 2020-10-23 DIAGNOSIS — M79621 Pain in right upper arm: Secondary | ICD-10-CM | POA: Diagnosis not present

## 2020-10-23 DIAGNOSIS — M79605 Pain in left leg: Secondary | ICD-10-CM | POA: Diagnosis not present

## 2020-10-23 DIAGNOSIS — G894 Chronic pain syndrome: Secondary | ICD-10-CM | POA: Diagnosis not present

## 2020-10-23 DIAGNOSIS — M545 Low back pain, unspecified: Secondary | ICD-10-CM | POA: Diagnosis not present

## 2020-10-23 DIAGNOSIS — M79662 Pain in left lower leg: Secondary | ICD-10-CM | POA: Diagnosis not present

## 2020-10-23 DIAGNOSIS — G8929 Other chronic pain: Secondary | ICD-10-CM | POA: Diagnosis not present

## 2020-10-23 DIAGNOSIS — R7303 Prediabetes: Secondary | ICD-10-CM | POA: Diagnosis not present

## 2020-10-23 DIAGNOSIS — G629 Polyneuropathy, unspecified: Secondary | ICD-10-CM | POA: Diagnosis not present

## 2020-10-23 DIAGNOSIS — Z79891 Long term (current) use of opiate analgesic: Secondary | ICD-10-CM | POA: Diagnosis not present

## 2020-10-23 DIAGNOSIS — M79604 Pain in right leg: Secondary | ICD-10-CM | POA: Diagnosis not present

## 2020-10-23 DIAGNOSIS — F1120 Opioid dependence, uncomplicated: Secondary | ICD-10-CM | POA: Diagnosis not present

## 2020-10-23 DIAGNOSIS — M542 Cervicalgia: Secondary | ICD-10-CM | POA: Diagnosis not present

## 2020-10-23 DIAGNOSIS — M79622 Pain in left upper arm: Secondary | ICD-10-CM | POA: Diagnosis not present

## 2020-10-23 DIAGNOSIS — M79661 Pain in right lower leg: Secondary | ICD-10-CM | POA: Diagnosis not present

## 2020-12-04 DIAGNOSIS — G894 Chronic pain syndrome: Secondary | ICD-10-CM | POA: Diagnosis not present

## 2020-12-04 DIAGNOSIS — M79604 Pain in right leg: Secondary | ICD-10-CM | POA: Diagnosis not present

## 2020-12-04 DIAGNOSIS — R7303 Prediabetes: Secondary | ICD-10-CM | POA: Diagnosis not present

## 2020-12-04 DIAGNOSIS — M545 Low back pain, unspecified: Secondary | ICD-10-CM | POA: Diagnosis not present

## 2020-12-04 DIAGNOSIS — M79661 Pain in right lower leg: Secondary | ICD-10-CM | POA: Diagnosis not present

## 2020-12-04 DIAGNOSIS — G8929 Other chronic pain: Secondary | ICD-10-CM | POA: Diagnosis not present

## 2020-12-04 DIAGNOSIS — G629 Polyneuropathy, unspecified: Secondary | ICD-10-CM | POA: Diagnosis not present

## 2020-12-04 DIAGNOSIS — M79621 Pain in right upper arm: Secondary | ICD-10-CM | POA: Diagnosis not present

## 2020-12-04 DIAGNOSIS — M542 Cervicalgia: Secondary | ICD-10-CM | POA: Diagnosis not present

## 2020-12-04 DIAGNOSIS — Z79891 Long term (current) use of opiate analgesic: Secondary | ICD-10-CM | POA: Diagnosis not present

## 2020-12-04 DIAGNOSIS — M79622 Pain in left upper arm: Secondary | ICD-10-CM | POA: Diagnosis not present

## 2020-12-04 DIAGNOSIS — M79605 Pain in left leg: Secondary | ICD-10-CM | POA: Diagnosis not present

## 2020-12-04 DIAGNOSIS — F1120 Opioid dependence, uncomplicated: Secondary | ICD-10-CM | POA: Diagnosis not present

## 2020-12-04 DIAGNOSIS — M79662 Pain in left lower leg: Secondary | ICD-10-CM | POA: Diagnosis not present

## 2021-01-01 DIAGNOSIS — M542 Cervicalgia: Secondary | ICD-10-CM | POA: Diagnosis not present

## 2021-01-01 DIAGNOSIS — G894 Chronic pain syndrome: Secondary | ICD-10-CM | POA: Diagnosis not present

## 2021-01-01 DIAGNOSIS — F1120 Opioid dependence, uncomplicated: Secondary | ICD-10-CM | POA: Diagnosis not present

## 2021-01-01 DIAGNOSIS — M79622 Pain in left upper arm: Secondary | ICD-10-CM | POA: Diagnosis not present

## 2021-01-01 DIAGNOSIS — M79621 Pain in right upper arm: Secondary | ICD-10-CM | POA: Diagnosis not present

## 2021-01-01 DIAGNOSIS — M79605 Pain in left leg: Secondary | ICD-10-CM | POA: Diagnosis not present

## 2021-01-01 DIAGNOSIS — R7303 Prediabetes: Secondary | ICD-10-CM | POA: Diagnosis not present

## 2021-01-01 DIAGNOSIS — G629 Polyneuropathy, unspecified: Secondary | ICD-10-CM | POA: Diagnosis not present

## 2021-01-01 DIAGNOSIS — G8929 Other chronic pain: Secondary | ICD-10-CM | POA: Diagnosis not present

## 2021-01-01 DIAGNOSIS — Z6826 Body mass index (BMI) 26.0-26.9, adult: Secondary | ICD-10-CM | POA: Diagnosis not present

## 2021-01-01 DIAGNOSIS — Z79891 Long term (current) use of opiate analgesic: Secondary | ICD-10-CM | POA: Diagnosis not present

## 2021-01-01 DIAGNOSIS — M79661 Pain in right lower leg: Secondary | ICD-10-CM | POA: Diagnosis not present

## 2021-01-01 DIAGNOSIS — M79604 Pain in right leg: Secondary | ICD-10-CM | POA: Diagnosis not present

## 2021-01-01 DIAGNOSIS — M79662 Pain in left lower leg: Secondary | ICD-10-CM | POA: Diagnosis not present

## 2021-01-01 DIAGNOSIS — M545 Low back pain, unspecified: Secondary | ICD-10-CM | POA: Diagnosis not present

## 2021-01-07 ENCOUNTER — Other Ambulatory Visit: Payer: Self-pay | Admitting: Internal Medicine

## 2021-01-07 DIAGNOSIS — Q245 Malformation of coronary vessels: Secondary | ICD-10-CM | POA: Diagnosis not present

## 2021-01-07 DIAGNOSIS — Z20828 Contact with and (suspected) exposure to other viral communicable diseases: Secondary | ICD-10-CM | POA: Diagnosis not present

## 2021-01-07 DIAGNOSIS — I10 Essential (primary) hypertension: Secondary | ICD-10-CM | POA: Diagnosis not present

## 2021-01-07 DIAGNOSIS — Z20822 Contact with and (suspected) exposure to covid-19: Secondary | ICD-10-CM | POA: Diagnosis not present

## 2021-01-07 DIAGNOSIS — B9789 Other viral agents as the cause of diseases classified elsewhere: Secondary | ICD-10-CM | POA: Diagnosis not present

## 2021-01-08 LAB — SARS-COV-2 RNA,(COVID-19) QUALITATIVE NAAT: SARS CoV2 RNA: NOT DETECTED

## 2021-01-16 DIAGNOSIS — J069 Acute upper respiratory infection, unspecified: Secondary | ICD-10-CM | POA: Diagnosis not present

## 2021-01-16 DIAGNOSIS — I10 Essential (primary) hypertension: Secondary | ICD-10-CM | POA: Diagnosis not present

## 2021-01-16 DIAGNOSIS — Q245 Malformation of coronary vessels: Secondary | ICD-10-CM | POA: Diagnosis not present

## 2021-01-29 DIAGNOSIS — M79622 Pain in left upper arm: Secondary | ICD-10-CM | POA: Diagnosis not present

## 2021-01-29 DIAGNOSIS — Z6826 Body mass index (BMI) 26.0-26.9, adult: Secondary | ICD-10-CM | POA: Diagnosis not present

## 2021-01-29 DIAGNOSIS — M79604 Pain in right leg: Secondary | ICD-10-CM | POA: Diagnosis not present

## 2021-01-29 DIAGNOSIS — G894 Chronic pain syndrome: Secondary | ICD-10-CM | POA: Diagnosis not present

## 2021-01-29 DIAGNOSIS — M545 Low back pain, unspecified: Secondary | ICD-10-CM | POA: Diagnosis not present

## 2021-01-29 DIAGNOSIS — M542 Cervicalgia: Secondary | ICD-10-CM | POA: Diagnosis not present

## 2021-01-29 DIAGNOSIS — Z79891 Long term (current) use of opiate analgesic: Secondary | ICD-10-CM | POA: Diagnosis not present

## 2021-01-29 DIAGNOSIS — M79661 Pain in right lower leg: Secondary | ICD-10-CM | POA: Diagnosis not present

## 2021-01-29 DIAGNOSIS — M79662 Pain in left lower leg: Secondary | ICD-10-CM | POA: Diagnosis not present

## 2021-01-29 DIAGNOSIS — M79605 Pain in left leg: Secondary | ICD-10-CM | POA: Diagnosis not present

## 2021-01-29 DIAGNOSIS — G629 Polyneuropathy, unspecified: Secondary | ICD-10-CM | POA: Diagnosis not present

## 2021-01-29 DIAGNOSIS — M79621 Pain in right upper arm: Secondary | ICD-10-CM | POA: Diagnosis not present

## 2021-01-29 DIAGNOSIS — G8929 Other chronic pain: Secondary | ICD-10-CM | POA: Diagnosis not present

## 2021-01-29 DIAGNOSIS — R7303 Prediabetes: Secondary | ICD-10-CM | POA: Diagnosis not present

## 2021-01-29 DIAGNOSIS — F1120 Opioid dependence, uncomplicated: Secondary | ICD-10-CM | POA: Diagnosis not present

## 2021-02-26 DIAGNOSIS — G8929 Other chronic pain: Secondary | ICD-10-CM | POA: Diagnosis not present

## 2021-02-26 DIAGNOSIS — G894 Chronic pain syndrome: Secondary | ICD-10-CM | POA: Diagnosis not present

## 2021-02-26 DIAGNOSIS — G629 Polyneuropathy, unspecified: Secondary | ICD-10-CM | POA: Diagnosis not present

## 2021-02-26 DIAGNOSIS — M542 Cervicalgia: Secondary | ICD-10-CM | POA: Diagnosis not present

## 2021-02-26 DIAGNOSIS — M79605 Pain in left leg: Secondary | ICD-10-CM | POA: Diagnosis not present

## 2021-02-26 DIAGNOSIS — Z79891 Long term (current) use of opiate analgesic: Secondary | ICD-10-CM | POA: Diagnosis not present

## 2021-02-26 DIAGNOSIS — Z6826 Body mass index (BMI) 26.0-26.9, adult: Secondary | ICD-10-CM | POA: Diagnosis not present

## 2021-02-26 DIAGNOSIS — M79662 Pain in left lower leg: Secondary | ICD-10-CM | POA: Diagnosis not present

## 2021-02-26 DIAGNOSIS — M79604 Pain in right leg: Secondary | ICD-10-CM | POA: Diagnosis not present

## 2021-02-26 DIAGNOSIS — M79622 Pain in left upper arm: Secondary | ICD-10-CM | POA: Diagnosis not present

## 2021-02-26 DIAGNOSIS — F1120 Opioid dependence, uncomplicated: Secondary | ICD-10-CM | POA: Diagnosis not present

## 2021-02-26 DIAGNOSIS — M79661 Pain in right lower leg: Secondary | ICD-10-CM | POA: Diagnosis not present

## 2021-02-26 DIAGNOSIS — R7303 Prediabetes: Secondary | ICD-10-CM | POA: Diagnosis not present

## 2021-02-26 DIAGNOSIS — M545 Low back pain, unspecified: Secondary | ICD-10-CM | POA: Diagnosis not present

## 2021-04-03 DIAGNOSIS — Z13220 Encounter for screening for lipoid disorders: Secondary | ICD-10-CM | POA: Diagnosis not present

## 2021-04-03 DIAGNOSIS — Z716 Tobacco abuse counseling: Secondary | ICD-10-CM | POA: Diagnosis not present

## 2021-04-03 DIAGNOSIS — G894 Chronic pain syndrome: Secondary | ICD-10-CM | POA: Diagnosis not present

## 2021-04-03 DIAGNOSIS — I10 Essential (primary) hypertension: Secondary | ICD-10-CM | POA: Diagnosis not present

## 2021-04-03 DIAGNOSIS — Q245 Malformation of coronary vessels: Secondary | ICD-10-CM | POA: Diagnosis not present

## 2021-04-03 DIAGNOSIS — Z131 Encounter for screening for diabetes mellitus: Secondary | ICD-10-CM | POA: Diagnosis not present

## 2021-04-03 DIAGNOSIS — Z125 Encounter for screening for malignant neoplasm of prostate: Secondary | ICD-10-CM | POA: Diagnosis not present

## 2021-04-03 DIAGNOSIS — Z0000 Encounter for general adult medical examination without abnormal findings: Secondary | ICD-10-CM | POA: Diagnosis not present

## 2021-04-03 DIAGNOSIS — F17210 Nicotine dependence, cigarettes, uncomplicated: Secondary | ICD-10-CM | POA: Diagnosis not present

## 2021-04-04 ENCOUNTER — Ambulatory Visit: Payer: PPO | Admitting: Cardiology

## 2021-04-19 DIAGNOSIS — M542 Cervicalgia: Secondary | ICD-10-CM | POA: Diagnosis not present

## 2021-04-19 DIAGNOSIS — G629 Polyneuropathy, unspecified: Secondary | ICD-10-CM | POA: Diagnosis not present

## 2021-04-19 DIAGNOSIS — M79621 Pain in right upper arm: Secondary | ICD-10-CM | POA: Diagnosis not present

## 2021-04-19 DIAGNOSIS — G894 Chronic pain syndrome: Secondary | ICD-10-CM | POA: Diagnosis not present

## 2021-04-19 DIAGNOSIS — Z6826 Body mass index (BMI) 26.0-26.9, adult: Secondary | ICD-10-CM | POA: Diagnosis not present

## 2021-04-19 DIAGNOSIS — M79661 Pain in right lower leg: Secondary | ICD-10-CM | POA: Diagnosis not present

## 2021-04-19 DIAGNOSIS — M79604 Pain in right leg: Secondary | ICD-10-CM | POA: Diagnosis not present

## 2021-04-19 DIAGNOSIS — M79662 Pain in left lower leg: Secondary | ICD-10-CM | POA: Diagnosis not present

## 2021-04-19 DIAGNOSIS — Z79891 Long term (current) use of opiate analgesic: Secondary | ICD-10-CM | POA: Diagnosis not present

## 2021-04-19 DIAGNOSIS — G8929 Other chronic pain: Secondary | ICD-10-CM | POA: Diagnosis not present

## 2021-04-19 DIAGNOSIS — M79622 Pain in left upper arm: Secondary | ICD-10-CM | POA: Diagnosis not present

## 2021-04-19 DIAGNOSIS — F1120 Opioid dependence, uncomplicated: Secondary | ICD-10-CM | POA: Diagnosis not present

## 2021-04-19 DIAGNOSIS — M545 Low back pain, unspecified: Secondary | ICD-10-CM | POA: Diagnosis not present

## 2021-04-19 DIAGNOSIS — M79605 Pain in left leg: Secondary | ICD-10-CM | POA: Diagnosis not present

## 2021-04-19 DIAGNOSIS — R7303 Prediabetes: Secondary | ICD-10-CM | POA: Diagnosis not present
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
## 2021-04-25 ENCOUNTER — Encounter: Payer: Self-pay | Admitting: Student

## 2021-04-25 ENCOUNTER — Other Ambulatory Visit: Payer: Self-pay

## 2021-04-25 ENCOUNTER — Ambulatory Visit: Payer: PPO | Admitting: Student

## 2021-04-25 ENCOUNTER — Ambulatory Visit (INDEPENDENT_AMBULATORY_CARE_PROVIDER_SITE_OTHER): Payer: PPO

## 2021-04-25 ENCOUNTER — Other Ambulatory Visit: Payer: Self-pay | Admitting: Student

## 2021-04-25 VITALS — BP 134/80 | HR 86 | Ht 67.0 in | Wt 180.0 lb

## 2021-04-25 DIAGNOSIS — E785 Hyperlipidemia, unspecified: Secondary | ICD-10-CM

## 2021-04-25 DIAGNOSIS — I2510 Atherosclerotic heart disease of native coronary artery without angina pectoris: Secondary | ICD-10-CM

## 2021-04-25 DIAGNOSIS — R55 Syncope and collapse: Secondary | ICD-10-CM

## 2021-04-25 DIAGNOSIS — I255 Ischemic cardiomyopathy: Secondary | ICD-10-CM | POA: Diagnosis not present

## 2021-04-25 DIAGNOSIS — I10 Essential (primary) hypertension: Secondary | ICD-10-CM | POA: Diagnosis not present

## 2021-04-25 MED ORDER — ATORVASTATIN CALCIUM 40 MG PO TABS: 40.0000 mg | ORAL_TABLET | Freq: Every day | ORAL | 3 refills | Status: DC

## 2021-04-25 MED ORDER — CLOPIDOGREL BISULFATE 75 MG PO TABS: 75.0000 mg | ORAL_TABLET | Freq: Every day | ORAL | 3 refills | Status: DC

## 2021-04-25 MED ORDER — METOPROLOL SUCCINATE ER 25 MG PO TB24: 25.0000 mg | ORAL_TABLET | Freq: Every day | ORAL | 3 refills | Status: DC

## 2021-04-25 NOTE — Patient Instructions (Signed)
Medication Instructions:  Your physician recommends that you continue on your current medications as directed. Please refer to the Current Medication list given to you today.  *If you need a refill on your cardiac medications before your next appointment, please call your pharmacy*   Lab Work: NONE   If you have labs (blood work) drawn today and your tests are completely normal, you will receive your results only by: Marland Kitchen MyChart Message (if you have MyChart) OR . A paper copy in the mail If you have any lab test that is abnormal or we need to change your treatment, we will call you to review the results.   Testing/Procedures: Bryn Gulling- Long Term Monitor Instructions   Your physician has requested you wear your ZIO patch monitor___14____days.   This is a single patch monitor.  [employer] supplies one patch monitor per enrollment.  Additional stickers are not available.   Please do not apply patch if you will be having a Nuclear Stress Test, Echocardiogram, Cardiac CT, MRI, or Chest Xray during the time frame you would be wearing the monitor. The patch cannot be worn during these tests.  You cannot remove and re-apply the ZIO XT patch monitor.   Your ZIO patch monitor will be sent USPS Priority mail from [facility] directly to your home address. The monitor may also be mailed to a PO BOX if home delivery is not available.   It may take 3-5 days to receive your monitor after you have been enrolled.   Once you have received you monitor, please review enclosed instructions.  Your monitor has already been registered assigning a specific monitor serial # to you.   Applying the monitor   Shave hair from upper left chest.   Hold abrader disc by orange tab.  Rub abrader in 40 strokes over left upper chest as indicated in your monitor instructions.   Clean area with 4 enclosed alcohol pads .  Use all pads to assure are is cleaned thoroughly.  Let dry.   Apply patch as indicated in monitor  instructions.  Patch will be place under collarbone on left side of chest with arrow pointing upward.   Rub patch adhesive wings for 2 minutes.Remove white label marked "1".  Remove white label marked "2".  Rub patch adhesive wings for 2 additional minutes.   While looking in a mirror, press and release button in center of patch.  A small green light will flash 3-4 times .  This will be your only indicator the monitor has been turned on.     Do not shower for the first 24 hours.  You may shower after the first 24 hours.   Press button if you feel a symptom. You will hear a small click.  Record Date, Time and Symptom in the Patient Log Book.   When you are ready to remove patch, follow instructions on last 2 pages of Patient Log Book.  Stick patch monitor onto last page of Patient Log Book.   Place Patient Log Book in [place] box.  Use locking tab on box and tape box closed securely.  The [employer] has [employer] on it.  Please place in mailbox as soon as possible.  Your physician should have your test results approximately 7 days after the monitor has been mailed back to [facility].   Call [place] at [phone] if you have questions regarding your ZIO XT patch monitor.  Call them immediately if you see  an orange light blinking on your monitor.   If your monitor falls off in less than 4 days contact our Monitor department at [phone].  If your monitor becomes loose or falls off after 4 days call [employer] at [phone] for suggestions on securing your monitor.     Follow-Up: At [facility] you and your health needs are our priority.  As part of our continuing mission to provide you with exceptional heart care, we have created designated Provider Care Teams.  These Care Teams include your primary Cardiologist (physician) and Advanced Practice Providers (APPs -  Physician Assistants and Nurse Practitioners) who all work together to provide you with  the care you need, when you need it.  We recommend signing up for the patient portal called "MyChart".  Sign up information is provided on this After Visit Summary.  MyChart is used to connect with patients for Virtual Visits (Telemedicine).  Patients are able to view lab/test results, encounter notes, upcoming appointments, etc.  Non-urgent messages can be sent to your provider as well.   To learn more about what you can do with MyChart, go to [email or web address].    Your next appointment:   6 month(s)  The format for your next appointment:   In Person  Provider:   Carlyle Dolly, MD or Bernerd Pho, PA-C   Other Instructions Thank you for choosing [facility]!

## 2021-04-30 ENCOUNTER — Telehealth: Payer: Self-pay

## 2021-04-30 NOTE — Telephone Encounter (Signed)
Attempt to reach, voicemail not set up.

## 2021-04-30 NOTE — Telephone Encounter (Signed)
-----   Message from Michele M Lenze, PA-C sent at 04/30/2021  8:31 AM EDT ----- Dr. Branch says this patient can stop plavix and be on ASA 81 mg daily thanks ----- Message ----- From: Branch, Jonathan F, MD Sent: 04/30/2021   8:22 AM EDT To: Brittany M Strader, PA-C    ----- Message ----- From: Strader, Brittany M, PA-C Sent: 04/25/2021   6:44 PM EDT To: Jonathan F Branch, MD  Hi Dr. Branch,   This patient just completed a year of DAPT from his NSTEMI in 04/2020 with DESx2 to the LAD. I wanted to get your input in regards to stopping or continuing Plavix as he did have a residual 70% distal LAD stenosis?  Thanks for your help!  Best,  Brittany   

## 2021-04-30 NOTE — Progress Notes (Signed)
DAPT for just a year would be fine, can go to just ASA  Zandra Abts MD

## 2021-05-01 NOTE — Telephone Encounter (Signed)
Returning call.

## 2021-05-01 NOTE — Telephone Encounter (Signed)
Spoke to pt who verbalized medication changes made by Dr. Harl Bowie. Medication list updated to reflect changes

## 2021-05-01 NOTE — Telephone Encounter (Signed)
Unsuccessful attempt to reach pt, will try again

## 2021-05-02 ENCOUNTER — Telehealth: Payer: Self-pay

## 2021-05-02 NOTE — Telephone Encounter (Signed)
-----   Message from Imogene Burn, PA-C sent at 04/30/2021  8:31 AM EDT ----- Dr. Harl Bowie says this patient can stop plavix and be on ASA 81 mg daily thanks ----- Message ----- From: Arnoldo Lenis, MD Sent: 04/30/2021   8:22 AM EDT To: Erma Heritage, PA-C    ----- Message ----- From: Erma Heritage, PA-C Sent: 04/25/2021   6:44 PM EDT To: Arnoldo Lenis, MD  Hi Dr. Harl Bowie,   This patient just completed a year of DAPT from his NSTEMI in 04/2020 with DESx2 to the LAD. I wanted to get your input in regards to stopping or continuing Plavix as he did have a residual 70% distal LAD stenosis?  Thanks for your help!  [place],  [place]

## 2021-05-02 NOTE — Telephone Encounter (Signed)
I spoke with patient and he will stop Plavix and begin ASA EC 81 mg daily.

## 2021-05-24 DIAGNOSIS — M79662 Pain in left lower leg: Secondary | ICD-10-CM | POA: Diagnosis not present

## 2021-05-24 DIAGNOSIS — G8929 Other chronic pain: Secondary | ICD-10-CM | POA: Diagnosis not present

## 2021-05-24 DIAGNOSIS — Z6826 Body mass index (BMI) 26.0-26.9, adult: Secondary | ICD-10-CM | POA: Diagnosis not present

## 2021-05-24 DIAGNOSIS — Z79891 Long term (current) use of opiate analgesic: Secondary | ICD-10-CM | POA: Diagnosis not present

## 2021-05-24 DIAGNOSIS — M79661 Pain in right lower leg: Secondary | ICD-10-CM | POA: Diagnosis not present

## 2021-05-24 DIAGNOSIS — M79622 Pain in left upper arm: Secondary | ICD-10-CM | POA: Diagnosis not present

## 2021-05-24 DIAGNOSIS — M79621 Pain in right upper arm: Secondary | ICD-10-CM | POA: Diagnosis not present

## 2021-05-24 DIAGNOSIS — G629 Polyneuropathy, unspecified: Secondary | ICD-10-CM | POA: Diagnosis not present

## 2021-05-24 DIAGNOSIS — M545 Low back pain, unspecified: Secondary | ICD-10-CM | POA: Diagnosis not present

## 2021-05-24 DIAGNOSIS — M79605 Pain in left leg: Secondary | ICD-10-CM | POA: Diagnosis not present

## 2021-05-24 DIAGNOSIS — F1120 Opioid dependence, uncomplicated: Secondary | ICD-10-CM | POA: Diagnosis not present

## 2021-05-24 DIAGNOSIS — M79604 Pain in right leg: Secondary | ICD-10-CM | POA: Diagnosis not present

## 2021-05-24 DIAGNOSIS — M542 Cervicalgia: Secondary | ICD-10-CM | POA: Diagnosis not present

## 2021-05-24 DIAGNOSIS — R7303 Prediabetes: Secondary | ICD-10-CM | POA: Diagnosis not present

## 2021-05-24 DIAGNOSIS — G894 Chronic pain syndrome: Secondary | ICD-10-CM | POA: Diagnosis not present

## 2021-05-29 DIAGNOSIS — R55 Syncope and collapse: Secondary | ICD-10-CM | POA: Diagnosis not present

## 2021-06-21 DIAGNOSIS — M79605 Pain in left leg: Secondary | ICD-10-CM | POA: Diagnosis not present

## 2021-06-21 DIAGNOSIS — M79604 Pain in right leg: Secondary | ICD-10-CM | POA: Diagnosis not present

## 2021-06-21 DIAGNOSIS — M79621 Pain in right upper arm: Secondary | ICD-10-CM | POA: Diagnosis not present

## 2021-06-21 DIAGNOSIS — Z79891 Long term (current) use of opiate analgesic: Secondary | ICD-10-CM | POA: Diagnosis not present

## 2021-06-21 DIAGNOSIS — R7303 Prediabetes: Secondary | ICD-10-CM | POA: Diagnosis not present

## 2021-06-21 DIAGNOSIS — M79662 Pain in left lower leg: Secondary | ICD-10-CM | POA: Diagnosis not present

## 2021-06-21 DIAGNOSIS — F1120 Opioid dependence, uncomplicated: Secondary | ICD-10-CM | POA: Diagnosis not present

## 2021-06-21 DIAGNOSIS — G629 Polyneuropathy, unspecified: Secondary | ICD-10-CM | POA: Diagnosis not present

## 2021-06-21 DIAGNOSIS — M542 Cervicalgia: Secondary | ICD-10-CM | POA: Diagnosis not present

## 2021-06-21 DIAGNOSIS — M79661 Pain in right lower leg: Secondary | ICD-10-CM | POA: Diagnosis not present

## 2021-06-21 DIAGNOSIS — M545 Low back pain, unspecified: Secondary | ICD-10-CM | POA: Diagnosis not present

## 2021-06-21 DIAGNOSIS — Z6826 Body mass index (BMI) 26.0-26.9, adult: Secondary | ICD-10-CM | POA: Diagnosis not present

## 2021-06-21 DIAGNOSIS — M79622 Pain in left upper arm: Secondary | ICD-10-CM | POA: Diagnosis not present

## 2021-06-21 DIAGNOSIS — G894 Chronic pain syndrome: Secondary | ICD-10-CM | POA: Diagnosis not present

## 2021-06-21 DIAGNOSIS — G8929 Other chronic pain: Secondary | ICD-10-CM | POA: Diagnosis not present

## 2021-07-19 DIAGNOSIS — M79605 Pain in left leg: Secondary | ICD-10-CM | POA: Diagnosis not present

## 2021-07-19 DIAGNOSIS — Z6826 Body mass index (BMI) 26.0-26.9, adult: Secondary | ICD-10-CM | POA: Diagnosis not present

## 2021-07-19 DIAGNOSIS — M79622 Pain in left upper arm: Secondary | ICD-10-CM | POA: Diagnosis not present

## 2021-07-19 DIAGNOSIS — M79661 Pain in right lower leg: Secondary | ICD-10-CM | POA: Diagnosis not present

## 2021-07-19 DIAGNOSIS — M545 Low back pain, unspecified: Secondary | ICD-10-CM | POA: Diagnosis not present

## 2021-07-19 DIAGNOSIS — R7303 Prediabetes: Secondary | ICD-10-CM | POA: Diagnosis not present

## 2021-07-19 DIAGNOSIS — M79604 Pain in right leg: Secondary | ICD-10-CM | POA: Diagnosis not present

## 2021-07-19 DIAGNOSIS — G629 Polyneuropathy, unspecified: Secondary | ICD-10-CM | POA: Diagnosis not present

## 2021-07-19 DIAGNOSIS — M79621 Pain in right upper arm: Secondary | ICD-10-CM | POA: Diagnosis not present

## 2021-07-19 DIAGNOSIS — Z79891 Long term (current) use of opiate analgesic: Secondary | ICD-10-CM | POA: Diagnosis not present

## 2021-07-19 DIAGNOSIS — G894 Chronic pain syndrome: Secondary | ICD-10-CM | POA: Diagnosis not present

## 2021-07-19 DIAGNOSIS — M79662 Pain in left lower leg: Secondary | ICD-10-CM | POA: Diagnosis not present

## 2021-07-19 DIAGNOSIS — M542 Cervicalgia: Secondary | ICD-10-CM | POA: Diagnosis not present

## 2021-08-16 DIAGNOSIS — G894 Chronic pain syndrome: Secondary | ICD-10-CM | POA: Diagnosis not present

## 2021-08-16 DIAGNOSIS — Z6826 Body mass index (BMI) 26.0-26.9, adult: Secondary | ICD-10-CM | POA: Diagnosis not present

## 2021-08-16 DIAGNOSIS — M545 Low back pain, unspecified: Secondary | ICD-10-CM | POA: Diagnosis not present

## 2021-08-16 DIAGNOSIS — M79605 Pain in left leg: Secondary | ICD-10-CM | POA: Diagnosis not present

## 2021-08-16 DIAGNOSIS — M79622 Pain in left upper arm: Secondary | ICD-10-CM | POA: Diagnosis not present

## 2021-08-16 DIAGNOSIS — M79604 Pain in right leg: Secondary | ICD-10-CM | POA: Diagnosis not present

## 2021-08-16 DIAGNOSIS — M79662 Pain in left lower leg: Secondary | ICD-10-CM | POA: Diagnosis not present

## 2021-08-16 DIAGNOSIS — G629 Polyneuropathy, unspecified: Secondary | ICD-10-CM | POA: Diagnosis not present

## 2021-08-16 DIAGNOSIS — M79621 Pain in right upper arm: Secondary | ICD-10-CM | POA: Diagnosis not present

## 2021-08-16 DIAGNOSIS — R7303 Prediabetes: Secondary | ICD-10-CM | POA: Diagnosis not present

## 2021-08-16 DIAGNOSIS — M542 Cervicalgia: Secondary | ICD-10-CM | POA: Diagnosis not present

## 2021-08-16 DIAGNOSIS — M79661 Pain in right lower leg: Secondary | ICD-10-CM | POA: Diagnosis not present

## 2021-08-16 DIAGNOSIS — Z79891 Long term (current) use of opiate analgesic: Secondary | ICD-10-CM | POA: Diagnosis not present

## 2021-09-04 ENCOUNTER — Ambulatory Visit: Payer: PPO | Admitting: Adult Health

## 2021-09-04 VITALS — BP 155/88 | HR 59 | Ht 67.0 in | Wt 177.0 lb

## 2021-09-04 DIAGNOSIS — G43109 Migraine with aura, not intractable, without status migrainosus: Secondary | ICD-10-CM | POA: Diagnosis not present

## 2021-09-04 MED ORDER — TOPIRAMATE 100 MG PO TABS: 100.0000 mg | ORAL_TABLET | Freq: Every day | ORAL | 3 refills | Status: DC

## 2021-09-04 NOTE — Progress Notes (Signed)
PATIENT: Jim Martin DOB: 1956/11/16  REASON FOR VISIT: follow up HISTORY FROM: patient  HISTORY OF PRESENT ILLNESS: Today 09/04/21: [name] is a 65 year old male with a history of basilar migraines.  He returns today for follow-up.  Topamax continues to work well for him.  He currently takes 100 mg at bedtime.  He denies any migraines.  Overall he feels that he is doing well.  Returns today for an evaluation.  09/04/20: [name] is a 65 year old male with a history of basilar migraines.  He returns today for follow-up.  He remains on Topamax 100 mg at bedtime.  He denies any true migraine headaches.  He reports that in May he suffered a heart attack.  Reports that his 2 stents had to be placed.  Since then he has quit smoking and started exercising.  He returns today for an evaluation.  HISTORY 09/05/19:   [name] is a 65 year old male with a history of basilar migraines.  He returns today for follow-up.  He remains on Topamax 100 mg at bedtime.  He reports that this is working well for him.  He states that is been quite a while since he had a headache.  He still states that looking up will sometimes induce his headaches.  He is currently on oxycodone due to chronic neck and low back pain.  This is not managed by our office.  He returns today for an evaluation.    REVIEW OF SYSTEMS: Out of a complete 14 system review of symptoms, the patient complains only of the following symptoms, and all other reviewed systems are negative.  See HPI  ALLERGIES: No Known Allergies  HOME MEDICATIONS: Outpatient Medications Prior to Visit  Medication Sig Dispense Refill   aspirin EC 81 MG tablet Take 1 tablet (81 mg total) by mouth daily. Swallow whole. 90 tablet 3   atorvastatin (LIPITOR) 40 MG tablet Take 1 tablet (40 mg total) by mouth daily. 90 tablet 3   DULoxetine (CYMBALTA) 30 MG capsule Take 30 mg by mouth daily.     famotidine (PEPCID) 20 MG tablet Take 1 tablet (20 mg total) by  mouth 2 (two) times daily. 30 tablet 0   folic acid (FOLVITE) 1 MG tablet Take 1 mg by mouth daily.     gabapentin (NEURONTIN) 300 MG capsule Take 300 mg by mouth 3 (three) times daily.     methocarbamol (ROBAXIN) 750 MG tablet Take 750 mg by mouth 3 (three) times daily.     metoprolol succinate (TOPROL-XL) 25 MG 24 hr tablet Take 1 tablet (25 mg total) by mouth daily. 90 tablet 3   nitroGLYCERIN (NITROSTAT) 0.4 MG SL tablet Place 1 tablet (0.4 mg total) under the tongue every 5 (five) minutes as needed for chest pain (CP or SOB). 25 tablet 3   oxyCODONE (ROXICODONE) 15 MG immediate release tablet Take 15 mg by mouth 5 (five) times daily.  0   pantoprazole (PROTONIX) 40 MG tablet Take 40 mg by mouth daily.     topiramate (TOPAMAX) 100 MG tablet Take 1 tablet (100 mg total) by mouth at bedtime. 90 tablet 3   zolpidem (AMBIEN) 10 MG tablet Take 10 mg by mouth at bedtime as needed for sleep.     No facility-administered medications prior to visit.    PAST MEDICAL HISTORY: Past Medical History:  Diagnosis Date   Anxiety    Basilar migraine 10/05/2013   CAD (coronary artery disease)    a. s/p NSTEMI  in 04/2020 with DES x2 to LAD and residual 70% distal LAD stenosis   Chronic low back pain    Coronary artery disease    Hypertension    Ischemic cardiomyopathy    a. EF 35-40% by echo in 04/2020 with apical thrombus noted b. Apical thrombus resolved and EF improved to 50-55% by echo in 06/2020    PAST SURGICAL HISTORY: Past Surgical History:  Procedure Laterality Date   BRAIN SURGERY     COLONOSCOPY  11/2009   Dr. Oletta Lamas: medium-sized internal hemorrhoids, normal    CORONARY STENT INTERVENTION N/A 04/16/2020   Procedure: CORONARY STENT INTERVENTION;  Surgeon: Troy Sine, MD;  Location: [facility];  Service: Cardiovascular;  Laterality: N/A;   LEFT HEART CATH AND CORONARY ANGIOGRAPHY N/A 04/16/2020   Procedure: LEFT HEART CATH AND CORONARY ANGIOGRAPHY;  Surgeon: Troy Sine,  MD;  Location: [facility];  Service: Cardiovascular;  Laterality: N/A;   Lumbosacral spine surgery     SPINAL CORD STIMULATOR IMPLANT      FAMILY HISTORY: Family History  Problem Relation Age of Onset   Cancer Mother    Heart attack Father    Heart attack Brother    Colon cancer Neg Hx     SOCIAL HISTORY: Social History   Socioeconomic History   Marital status: Divorced    Spouse name: Not on file   Number of children: 1   Years of education: 8th   Highest education level: Not on file  Occupational History   Occupation: disabilty  Tobacco Use   Smoking status: Former    Packs/day: 1.50    Types: Cigarettes    Quit date: 04/16/2020    Years since quitting: 1.3   Smokeless tobacco: Never  Vaping Use   Vaping Use: Never used  Substance and Sexual Activity   Alcohol use: No    Alcohol/week: 0.0 standard drinks   Drug use: No   Sexual activity: Not on file  Other Topics Concern   Not on file  Social History Narrative   Patient is single with one child.   Patient is left handed.   Patient has an 8 th grade education.   Patient drinks 5 or more sodas daily.   Social Determinants of Health   Financial Resource Strain: Not on file  Food Insecurity: Not on file  Transportation Needs: Not on file  Physical Activity: Not on file  Stress: Not on file  Social Connections: Not on file  Intimate Partner Violence: Not on file      PHYSICAL EXAM  Vitals:   09/04/21 0852  BP: (!) 155/88  Pulse: (!) 59  Weight: 177 lb (80.3 kg)  Height: 5\' 7"  (1.702 m)   Body mass index is 27.72 kg/m.  Generalized: Well developed, in no acute distress   Neurological examination  Mentation: Alert oriented to time, place, history taking. Follows all commands speech and language fluent Cranial nerve II-XII: Pupils were equal round reactive to light. Extraocular movements were full, visual field were full on confrontational test.  Significant and beat nystagmus noted with  horizontal gaze. head turning and shoulder shrug  were normal and symmetric. Motor: The motor testing reveals 5 over 5 strength of all 4 extremities. Good symmetric motor tone is noted throughout.  Sensory: Sensory testing is intact to soft touch on all 4 extremities. No evidence of extinction is noted.  Coordination: Cerebellar testing reveals good finger-nose-finger and heel-to-shin bilaterally.  Gait and station: Gait is  normal.  Reflexes: Deep tendon reflexes are symmetric and normal bilaterally.   DIAGNOSTIC DATA (LABS, IMAGING, TESTING) - I reviewed patient records, labs, notes, testing and imaging myself where available.  Lab Results  Component Value Date   WBC 12.2 (H) 04/17/2020   HGB 13.0 04/17/2020   HCT 39.2 04/17/2020   MCV 94.9 04/17/2020   PLT 233 04/17/2020      Component Value Date/Time   NA 138 04/17/2020 0331   K 3.6 04/17/2020 0331   CL 109 04/17/2020 0331   CO2 20 (L) 04/17/2020 0331   GLUCOSE 106 (H) 04/17/2020 0331   BUN 10 04/17/2020 0331   CREATININE 0.84 04/17/2020 0331   CALCIUM 8.7 (L) 04/17/2020 0331   PROT 6.5 07/13/2020 1242   ALBUMIN 3.9 07/13/2020 1242   AST 22 07/13/2020 1242   ALT 31 07/13/2020 1242   ALKPHOS 102 07/13/2020 1242   BILITOT 0.6 07/13/2020 1242   GFRNONAA >60 04/17/2020 0331   GFRAA >60 04/17/2020 0331   Lab Results  Component Value Date   CHOL 103 07/13/2020   HDL 35 (L) 07/13/2020   LDLCALC 49 07/13/2020   TRIG 94 07/13/2020   CHOLHDL 2.9 07/13/2020   Lab Results  Component Value Date   HGBA1C 5.2 04/17/2020     ASSESSMENT AND PLAN 65 y.o. year old male  has a past medical history of Anxiety, Basilar migraine (10/05/2013), CAD (coronary artery disease), Chronic low back pain, Coronary artery disease, Hypertension, and Ischemic cardiomyopathy. here with:  1.  Basilar migraines  Continue Topamax 100 mg daily Advised that he can get further refills from his PCP if they are amenable. Follow-up with our office on  an as-needed basis    Ward Givens, MSN, NP-C 09/04/2021, 9:12 AM [facility] [address], [place], [place] [address] [phone]

## 2021-09-04 NOTE — Patient Instructions (Signed)
Your Plan:  Continue Topamax 100 mg at bedtime.  Discuss with PCP about refilling topamax If your symptoms worsen or you develop new symptoms please let us know.       Thank you for coming to see [place] at [facility]. I hope we have been able to provide you high quality care today.  You may receive a patient satisfaction survey over the next few weeks. We would appreciate your feedback and comments so that we may continue to improve ourselves and the health of our patients. a

## 2021-09-04 NOTE — Progress Notes (Signed)
I have read the note, and I agree with the clinical assessment and plan.  Kenetra Hildenbrand K Rashod Gougeon   

## 2021-09-13 DIAGNOSIS — M542 Cervicalgia: Secondary | ICD-10-CM | POA: Diagnosis not present

## 2021-09-13 DIAGNOSIS — M79605 Pain in left leg: Secondary | ICD-10-CM | POA: Diagnosis not present

## 2021-09-13 DIAGNOSIS — G894 Chronic pain syndrome: Secondary | ICD-10-CM | POA: Diagnosis not present

## 2021-09-13 DIAGNOSIS — M79662 Pain in left lower leg: Secondary | ICD-10-CM | POA: Diagnosis not present

## 2021-09-13 DIAGNOSIS — G629 Polyneuropathy, unspecified: Secondary | ICD-10-CM | POA: Diagnosis not present

## 2021-09-13 DIAGNOSIS — M79622 Pain in left upper arm: Secondary | ICD-10-CM | POA: Diagnosis not present

## 2021-09-13 DIAGNOSIS — Z79891 Long term (current) use of opiate analgesic: Secondary | ICD-10-CM | POA: Diagnosis not present

## 2021-09-13 DIAGNOSIS — M545 Low back pain, unspecified: Secondary | ICD-10-CM | POA: Diagnosis not present

## 2021-09-13 DIAGNOSIS — R7303 Prediabetes: Secondary | ICD-10-CM | POA: Diagnosis not present

## 2021-09-13 DIAGNOSIS — M79661 Pain in right lower leg: Secondary | ICD-10-CM | POA: Diagnosis not present

## 2021-09-13 DIAGNOSIS — Z6826 Body mass index (BMI) 26.0-26.9, adult: Secondary | ICD-10-CM | POA: Diagnosis not present

## 2021-09-13 DIAGNOSIS — M79621 Pain in right upper arm: Secondary | ICD-10-CM | POA: Diagnosis not present

## 2021-09-13 DIAGNOSIS — M79604 Pain in right leg: Secondary | ICD-10-CM | POA: Diagnosis not present

## 2021-10-14 DIAGNOSIS — M79662 Pain in left lower leg: Secondary | ICD-10-CM | POA: Diagnosis not present

## 2021-10-14 DIAGNOSIS — M79622 Pain in left upper arm: Secondary | ICD-10-CM | POA: Diagnosis not present

## 2021-10-14 DIAGNOSIS — G894 Chronic pain syndrome: Secondary | ICD-10-CM | POA: Diagnosis not present

## 2021-10-14 DIAGNOSIS — Z6826 Body mass index (BMI) 26.0-26.9, adult: Secondary | ICD-10-CM | POA: Diagnosis not present

## 2021-10-14 DIAGNOSIS — M79604 Pain in right leg: Secondary | ICD-10-CM | POA: Diagnosis not present

## 2021-10-14 DIAGNOSIS — M79621 Pain in right upper arm: Secondary | ICD-10-CM | POA: Diagnosis not present

## 2021-10-14 DIAGNOSIS — G629 Polyneuropathy, unspecified: Secondary | ICD-10-CM | POA: Diagnosis not present

## 2021-10-14 DIAGNOSIS — M79605 Pain in left leg: Secondary | ICD-10-CM | POA: Diagnosis not present

## 2021-10-14 DIAGNOSIS — M542 Cervicalgia: Secondary | ICD-10-CM | POA: Diagnosis not present

## 2021-10-14 DIAGNOSIS — M79661 Pain in right lower leg: Secondary | ICD-10-CM | POA: Diagnosis not present

## 2021-10-14 DIAGNOSIS — M545 Low back pain, unspecified: Secondary | ICD-10-CM | POA: Diagnosis not present

## 2021-10-14 DIAGNOSIS — R7303 Prediabetes: Secondary | ICD-10-CM | POA: Diagnosis not present

## 2021-10-14 DIAGNOSIS — Z79891 Long term (current) use of opiate analgesic: Secondary | ICD-10-CM | POA: Diagnosis not present

## 2021-10-22 ENCOUNTER — Ambulatory Visit: Payer: PPO | Admitting: Student

## 2021-10-22 ENCOUNTER — Other Ambulatory Visit: Payer: Self-pay

## 2021-10-22 ENCOUNTER — Encounter: Payer: Self-pay | Admitting: Student

## 2021-10-22 VITALS — BP 148/90 | HR 70 | Ht 68.0 in | Wt 177.0 lb

## 2021-10-22 DIAGNOSIS — I10 Essential (primary) hypertension: Secondary | ICD-10-CM | POA: Diagnosis not present

## 2021-10-22 DIAGNOSIS — Z8679 Personal history of other diseases of the circulatory system: Secondary | ICD-10-CM

## 2021-10-22 DIAGNOSIS — R55 Syncope and collapse: Secondary | ICD-10-CM

## 2021-10-22 DIAGNOSIS — I2510 Atherosclerotic heart disease of native coronary artery without angina pectoris: Secondary | ICD-10-CM | POA: Diagnosis not present

## 2021-10-22 DIAGNOSIS — E785 Hyperlipidemia, unspecified: Secondary | ICD-10-CM | POA: Diagnosis not present

## 2021-10-22 NOTE — Progress Notes (Signed)
Cardiology Office Note    Date:  10/22/2021   ID:  Jim Martin, DOB 1956/06/25, MRN [id number]  PCP:  Jim Ebbs, MD  Cardiologist: Jim Dolly, MD    Chief Complaint  Patient presents with   Follow-up    6 month visit    History of Present Illness:    Jim Martin is a 65 y.o. male with past medical history of CAD (s/p NSTEMI in 04/2020 with DES x2 to LAD and residual 70% distal LAD stenosis), HFimpEF (EF 35-40% by echo in 04/2020, improved to 50-55% by echo in 06/2020), apical thrombus (by echo in 04/2020) HTN, HLD, migraine headaches and prior tobacco use who presents to the office today for 38-month follow-up.  He was last examined by myself in 04/2021 and reported brief syncopal episodes over the past several months but denied any recent anginal symptoms. A 2-week monitor was placed to assess for any significant arrhythmias and showed predominantly normal sinus rhythm with rare PAC's and PVC's with an overall burden of less than 1%  In talking with the patient today, he denies any recent chest pain or dyspnea on exertion. No recent orthopnea, PND, pitting edema or palpitations. His activity is limited secondary to his back and leg pain. He denies any recurrent syncopal episodes and says he was previously on Gabapentin and feels like this was contributing to his symptoms as it would make him very sleepy. He has noticed worsening neuropathy since stopping the medication.   Past Medical History:  Diagnosis Date   Anxiety    Basilar migraine 10/05/2013   CAD (coronary artery disease)    a. s/p NSTEMI in 04/2020 with DES x2 to LAD and residual 70% distal LAD stenosis   Chronic low back pain    Coronary artery disease    Hypertension    Ischemic cardiomyopathy    a. EF 35-40% by echo in 04/2020 with apical thrombus noted b. Apical thrombus resolved and EF improved to 50-55% by echo in 06/2020    Past Surgical History:  Procedure Laterality Date   BRAIN SURGERY      COLONOSCOPY  11/2009   Dr. Oletta Lamas: medium-sized internal hemorrhoids, normal    CORONARY STENT INTERVENTION N/A 04/16/2020   Procedure: CORONARY STENT INTERVENTION;  Surgeon: Troy Sine, MD;  Location: [facility];  Service: Cardiovascular;  Laterality: N/A;   LEFT HEART CATH AND CORONARY ANGIOGRAPHY N/A 04/16/2020   Procedure: LEFT HEART CATH AND CORONARY ANGIOGRAPHY;  Surgeon: Troy Sine, MD;  Location: [facility];  Service: Cardiovascular;  Laterality: N/A;   Lumbosacral spine surgery     SPINAL CORD STIMULATOR IMPLANT      Current Medications: Outpatient Medications Prior to Visit  Medication Sig Dispense Refill   amLODipine (NORVASC) 10 MG tablet Take 10 mg by mouth at bedtime.     aspirin EC 81 MG tablet Take 1 tablet (81 mg total) by mouth daily. Swallow whole. 90 tablet 3   atorvastatin (LIPITOR) 40 MG tablet Take 1 tablet (40 mg total) by mouth daily. 90 tablet 3   DULoxetine (CYMBALTA) 30 MG capsule Take 30 mg by mouth daily.     famotidine (PEPCID) 20 MG tablet Take 1 tablet (20 mg total) by mouth 2 (two) times daily. 30 tablet 0   folic acid (FOLVITE) 1 MG tablet Take 1 mg by mouth daily.     gabapentin (NEURONTIN) 300 MG capsule Take 300 mg by mouth 3 (three) times daily.  methocarbamol (ROBAXIN) 750 MG tablet Take 750 mg by mouth 3 (three) times daily.     metoprolol succinate (TOPROL-XL) 25 MG 24 hr tablet Take 1 tablet (25 mg total) by mouth daily. 90 tablet 3   nitroGLYCERIN (NITROSTAT) 0.4 MG SL tablet Place 1 tablet (0.4 mg total) under the tongue every 5 (five) minutes as needed for chest pain (CP or SOB). 25 tablet 3   oxyCODONE (ROXICODONE) 15 MG immediate release tablet Take 15 mg by mouth 5 (five) times daily.  0   pantoprazole (PROTONIX) 40 MG tablet Take 40 mg by mouth daily.     topiramate (TOPAMAX) 100 MG tablet Take 1 tablet (100 mg total) by mouth at bedtime. 90 tablet 3   zolpidem (AMBIEN) 10 MG tablet Take 10 mg by mouth at bedtime  as needed for sleep.     No facility-administered medications prior to visit.     Allergies:   Patient has no known allergies.   Social History   Socioeconomic History   Marital status: Divorced    Spouse name: Not on file   Number of children: 1   Years of education: 8th   Highest education level: Not on file  Occupational History   Occupation: disabilty  Tobacco Use   Smoking status: Former    Packs/day: 1.50    Types: Cigarettes    Quit date: 04/16/2020    Years since quitting: 1.5   Smokeless tobacco: Never  Vaping Use   Vaping Use: Never used  Substance and Sexual Activity   Alcohol use: No    Alcohol/week: 0.0 standard drinks   Drug use: No   Sexual activity: Not on file  Other Topics Concern   Not on file  Social History Narrative   Patient is single with one child.   Patient is left handed.   Patient has an 8 th grade education.   Patient drinks 5 or more sodas daily.   Social Determinants of Health   Financial Resource Strain: Not on file  Food Insecurity: Not on file  Transportation Needs: Not on file  Physical Activity: Not on file  Stress: Not on file  Social Connections: Not on file     Family History:  The patient's family history includes Cancer in his mother; Heart attack in his brother and father.   Review of Systems:    Please see the history of present illness.     All other systems reviewed and are otherwise negative except as noted above.   Physical Exam:    VS:  BP (!) 148/90   Pulse 70   Ht 5\' 8"  (1.727 m)   Wt 177 lb (80.3 kg)   SpO2 97%   BMI 26.91 kg/m    General: Well developed, well nourished,male appearing in no acute distress. Head: Normocephalic, atraumatic. Neck: No carotid bruits. JVD not elevated.  Lungs: Respirations regular and unlabored, without wheezes or rales.  Heart: Regular rate and rhythm. No S3 or S4.  No murmur, no rubs, or gallops appreciated. Abdomen: Appears non-distended. No obvious abdominal  masses. Msk:  Strength and tone appear normal for age. No obvious joint deformities or effusions. Extremities: No clubbing or cyanosis. No pitting edema.  Distal pedal pulses are 2+ bilaterally. Neuro: Alert and oriented X 3. Moves all extremities spontaneously. No focal deficits noted. Psych:  Responds to questions appropriately with a normal affect. Skin: No rashes or lesions noted  Wt Readings from Last 3 Encounters:  10/22/21 177 lb (80.3  kg)  09/04/21 177 lb (80.3 kg)  04/25/21 180 lb (81.6 kg)     Studies/Labs Reviewed:   EKG:  EKG is not ordered today.  Recent Labs: No results found for requested labs within last 8760 hours.   Lipid Panel    Component Value Date/Time   CHOL 103 07/13/2020 1242   TRIG 94 07/13/2020 1242   HDL 35 (L) 07/13/2020 1242   CHOLHDL 2.9 07/13/2020 1242   VLDL 19 07/13/2020 1242   LDLCALC 49 07/13/2020 1242    Additional studies/ records that were reviewed today include:   Cardiac Catheterization: 04/2020 Mid LAD-1 lesion is 75% stenosed. Mid LAD-2 lesion is 99% stenosed. Mid LAD to Dist LAD lesion is 95% stenosed. Dist LAD lesion is 70% stenosed. Post intervention, there is a 0% residual stenosis. Post intervention, there is a 0% residual stenosis. A stent was successfully placed. Post intervention, there is a 0% residual stenosis.   Acute coronary syndrome/non-STEMI secondary to 3 tandem stenoses in a large LAD system with 70% proximal at site of ulcerated plaque, 99% in the proximal to mid and 95% eccentric mid stenosis, and 70% smooth distal LAD stenosis; relatively normal left circumflex system; RCA with 70% proximal and 20% mid stenoses.   LVEDP 21 mmHg.   Successful percutaneous coronary intervention of the tandem LAD lesions with ultimate insertion of a 2.75 x 38 mm stent from distal to proximal and 3.0 x 12 mm Resolute Onyx DES stent with stent overlap to cover the most proximal stenosis.  All stenoses were reduced to 0%.  There  was brisk TIMI-3 flow.  There was no change in the distal 70% LAD stenosis.   RECOMMENDATION: DAPT for minimum of 1 year.  Smoking cessation is essential.  Aggressive lipid intervention with target LDL less than 70.  Optimal blood pressure control.  An echo Doppler study will be done to assess LV function.  Echocardiogram: 04/2020 IMPRESSIONS     1. Left ventricular ejection fraction, by estimation, is 50 to 55%. The  left ventricle has low normal function. The left ventricle demonstrates  mild global hypokinesis. Left ventricular diastolic parameters are  consistent with Grade I diastolic  dysfunction (impaired relaxation).   2. No definite LV mural thrombus with or without Definity contrast.   3. Right ventricular systolic function is normal. The right ventricular  size is normal. Tricuspid regurgitation signal is inadequate for assessing  PA pressure.   4. Left atrial size was upper normal.   5. The mitral valve is grossly normal. Mild mitral valve regurgitation.   6. The aortic valve is tricuspid. Aortic valve regurgitation is not  visualized.   7. The inferior vena cava is normal in size with greater than 50%  respiratory variability, suggesting right atrial pressure of 3 mmHg.   Event Monitor: 05/2021 Rare supraventricular ectopy Rare ventricular ectopy No reported symptoms No significant arrhythmias    Assessment:    1. Coronary artery disease involving native coronary artery of native heart without angina pectoris   2. History of cardiomyopathy   3. Syncope, unspecified syncope type   4. Essential hypertension   5. Hyperlipidemia LDL goal <70      Plan:   In order of problems listed above:  1. CAD - He is s/p NSTEMI in 04/2020 with DES x2 to LAD and residual 70% distal LAD stenosis. He denies any recent chest pain or dyspnea on exertion. - Continue current medication regimen with ASA 81 mg daily, Atorvastatin 40 mg daily,  Toprol-XL 25 mg daily and PRN SL  NTG.  2. HFimpEF - His EF was previously at 35-40% by echo in 04/2020, improved to 50-55% by echo in 06/2020. He denies any recent orthopnea, PND or lower extremity edema and appears euvolemic by examination today. Remains on Toprol-XL 25 mg daily.  3. Syncope - His episodes had occurred earlier in the year and event monitor was unrevealing for a cardiac arrhythmia. He was taking Gabapentin at the time and reports significant fatigue with the medication and symptoms resolved upon discontinuing this.  He has unfortunately experienced worsening neuropathy and I encouraged him to review this with his PCP as he may benefit from taking Gabapentin just at night or trying a lower dose. He plans to discuss further with his PCP at his next visit.   4. HTN - His blood pressure is elevated to 148/90 during today's visit but he has been without his Amlodipine and Toprol-XL for the past 4 days. He is planning to pick up his prescriptions from the pharmacy today.   5. HLD - Followed by his PCP. Will request a copy of most recent labs. He remains on Atorvastatin 40 mg daily and LDL was previously at 49 in 06/2020.   Medication Adjustments/Labs and Tests Ordered: Current medicines are reviewed at length with the patient today.  Concerns regarding medicines are outlined above.  Medication changes, Labs and Tests ordered today are listed in the Patient Instructions below. Patient Instructions  Make sure you are taking Metoprolol Succinate (Toprol-XL) 25mg  daily and Atorvastatin (Lipitor 40mg  daily) as they are your HEART MEDICATIONS.   You DO NOT need to take Clopidogrel (Plavix).     Labwork: None today    Testing/Procedures:  None today  Follow-Up:  6 months with [place], PA-C, or Dr.Branch  Any Other Special Instructions Will Be Listed Below (If Applicable).  If you need a refill on your cardiac medications before your next appointment, please call your pharmacy.    Signed, Erma Heritage, PA-C  10/22/2021 4:36 PM    [facility] [address] [place], [place] [address] Phone: [phone] Fax: [phone]

## 2021-10-22 NOTE — Patient Instructions (Addendum)
Make sure you are taking Metoprolol Succinate (Toprol-XL) 25mg  daily and Atorvastatin (Lipitor 40mg  daily) as they are your HEART MEDICATIONS.   You DO NOT need to take Clopidogrel (Plavix).     Labwork: None today    Testing/Procedures:  None today  Follow-Up:  6 months with [place], PA-C, or Dr.Branch  Any Other Special Instructions Will Be Listed Below (If Applicable).  If you need a refill on your cardiac medications before your next appointment, please call your pharmacy.

## 2021-11-11 DIAGNOSIS — M79622 Pain in left upper arm: Secondary | ICD-10-CM | POA: Diagnosis not present

## 2021-11-11 DIAGNOSIS — M545 Low back pain, unspecified: Secondary | ICD-10-CM | POA: Diagnosis not present

## 2021-11-11 DIAGNOSIS — M79604 Pain in right leg: Secondary | ICD-10-CM | POA: Diagnosis not present

## 2021-11-11 DIAGNOSIS — Z79891 Long term (current) use of opiate analgesic: Secondary | ICD-10-CM | POA: Diagnosis not present

## 2021-11-11 DIAGNOSIS — Z6826 Body mass index (BMI) 26.0-26.9, adult: Secondary | ICD-10-CM | POA: Diagnosis not present

## 2021-11-11 DIAGNOSIS — M79661 Pain in right lower leg: Secondary | ICD-10-CM | POA: Diagnosis not present

## 2021-11-11 DIAGNOSIS — M79621 Pain in right upper arm: Secondary | ICD-10-CM | POA: Diagnosis not present

## 2021-11-11 DIAGNOSIS — M79605 Pain in left leg: Secondary | ICD-10-CM | POA: Diagnosis not present

## 2021-11-11 DIAGNOSIS — M79662 Pain in left lower leg: Secondary | ICD-10-CM | POA: Diagnosis not present

## 2021-11-11 DIAGNOSIS — G894 Chronic pain syndrome: Secondary | ICD-10-CM | POA: Diagnosis not present

## 2021-11-11 DIAGNOSIS — G629 Polyneuropathy, unspecified: Secondary | ICD-10-CM | POA: Diagnosis not present

## 2021-11-11 DIAGNOSIS — R7303 Prediabetes: Secondary | ICD-10-CM | POA: Diagnosis not present

## 2022-01-22 ENCOUNTER — Other Ambulatory Visit: Payer: Self-pay | Admitting: Student

## 2022-04-08 ENCOUNTER — Other Ambulatory Visit (HOSPITAL_COMMUNITY): Payer: Self-pay | Admitting: Anesthesiology

## 2022-04-08 ENCOUNTER — Other Ambulatory Visit: Payer: Self-pay | Admitting: Anesthesiology

## 2022-04-08 DIAGNOSIS — G43919 Migraine, unspecified, intractable, without status migrainosus: Secondary | ICD-10-CM

## 2022-04-08 DIAGNOSIS — R42 Dizziness and giddiness: Secondary | ICD-10-CM

## 2022-04-14 ENCOUNTER — Ambulatory Visit (HOSPITAL_COMMUNITY)
Admission: RE | Admit: 2022-04-14 | Discharge: 2022-04-14 | Disposition: A | Discharge status: Home / Self Care | Payer: Medicare HMO | Source: Ambulatory Visit | Attending: Anesthesiology | Admitting: Anesthesiology

## 2022-04-14 ENCOUNTER — Other Ambulatory Visit (HOSPITAL_COMMUNITY): Payer: Self-pay | Admitting: Anesthesiology

## 2022-04-14 DIAGNOSIS — M5136 Other intervertebral disc degeneration, lumbar region: Secondary | ICD-10-CM | POA: Diagnosis not present

## 2022-04-24 ENCOUNTER — Other Ambulatory Visit: Payer: Self-pay | Admitting: Internal Medicine

## 2022-04-24 ENCOUNTER — Other Ambulatory Visit (HOSPITAL_COMMUNITY): Payer: Self-pay | Admitting: Internal Medicine

## 2022-04-24 DIAGNOSIS — I739 Peripheral vascular disease, unspecified: Secondary | ICD-10-CM

## 2022-05-07 ENCOUNTER — Ambulatory Visit (INDEPENDENT_AMBULATORY_CARE_PROVIDER_SITE_OTHER): Payer: Medicare HMO

## 2022-05-07 DIAGNOSIS — I739 Peripheral vascular disease, unspecified: Secondary | ICD-10-CM | POA: Diagnosis not present

## 2022-05-22 DIAGNOSIS — M542 Cervicalgia: Secondary | ICD-10-CM | POA: Diagnosis not present

## 2022-05-22 DIAGNOSIS — M79605 Pain in left leg: Secondary | ICD-10-CM | POA: Diagnosis not present

## 2022-05-22 DIAGNOSIS — M79604 Pain in right leg: Secondary | ICD-10-CM | POA: Diagnosis not present

## 2022-05-22 DIAGNOSIS — M545 Low back pain, unspecified: Secondary | ICD-10-CM | POA: Diagnosis not present

## 2022-05-22 DIAGNOSIS — G894 Chronic pain syndrome: Secondary | ICD-10-CM | POA: Diagnosis not present

## 2022-05-22 DIAGNOSIS — M79661 Pain in right lower leg: Secondary | ICD-10-CM | POA: Diagnosis not present

## 2022-05-22 DIAGNOSIS — M79621 Pain in right upper arm: Secondary | ICD-10-CM | POA: Diagnosis not present

## 2022-05-22 DIAGNOSIS — M79662 Pain in left lower leg: Secondary | ICD-10-CM | POA: Diagnosis not present

## 2022-05-22 DIAGNOSIS — Z79891 Long term (current) use of opiate analgesic: Secondary | ICD-10-CM | POA: Diagnosis not present

## 2022-05-22 DIAGNOSIS — M79622 Pain in left upper arm: Secondary | ICD-10-CM | POA: Diagnosis not present

## 2022-06-09 DIAGNOSIS — M5126 Other intervertebral disc displacement, lumbar region: Secondary | ICD-10-CM | POA: Diagnosis not present

## 2022-06-09 DIAGNOSIS — R202 Paresthesia of skin: Secondary | ICD-10-CM | POA: Diagnosis not present

## 2022-06-09 DIAGNOSIS — I10 Essential (primary) hypertension: Secondary | ICD-10-CM | POA: Diagnosis not present

## 2022-06-09 DIAGNOSIS — G894 Chronic pain syndrome: Secondary | ICD-10-CM | POA: Diagnosis not present

## 2022-06-18 ENCOUNTER — Other Ambulatory Visit: Payer: Self-pay | Admitting: Adult Health

## 2022-06-19 DIAGNOSIS — M79661 Pain in right lower leg: Secondary | ICD-10-CM | POA: Diagnosis not present

## 2022-06-19 DIAGNOSIS — M79604 Pain in right leg: Secondary | ICD-10-CM | POA: Diagnosis not present

## 2022-06-19 DIAGNOSIS — M542 Cervicalgia: Secondary | ICD-10-CM | POA: Diagnosis not present

## 2022-06-19 DIAGNOSIS — M79605 Pain in left leg: Secondary | ICD-10-CM | POA: Diagnosis not present

## 2022-06-19 DIAGNOSIS — G894 Chronic pain syndrome: Secondary | ICD-10-CM | POA: Diagnosis not present

## 2022-06-19 DIAGNOSIS — M79662 Pain in left lower leg: Secondary | ICD-10-CM | POA: Diagnosis not present

## 2022-06-19 DIAGNOSIS — M79622 Pain in left upper arm: Secondary | ICD-10-CM | POA: Diagnosis not present

## 2022-06-19 DIAGNOSIS — Z79891 Long term (current) use of opiate analgesic: Secondary | ICD-10-CM | POA: Diagnosis not present

## 2022-06-19 DIAGNOSIS — M545 Low back pain, unspecified: Secondary | ICD-10-CM | POA: Diagnosis not present

## 2022-06-19 DIAGNOSIS — M79621 Pain in right upper arm: Secondary | ICD-10-CM | POA: Diagnosis not present

## 2022-07-16 DIAGNOSIS — M545 Low back pain, unspecified: Secondary | ICD-10-CM | POA: Diagnosis not present

## 2022-07-16 DIAGNOSIS — M79621 Pain in right upper arm: Secondary | ICD-10-CM | POA: Diagnosis not present

## 2022-07-16 DIAGNOSIS — Z79891 Long term (current) use of opiate analgesic: Secondary | ICD-10-CM | POA: Diagnosis not present

## 2022-07-16 DIAGNOSIS — M79622 Pain in left upper arm: Secondary | ICD-10-CM | POA: Diagnosis not present

## 2022-07-16 DIAGNOSIS — M79661 Pain in right lower leg: Secondary | ICD-10-CM | POA: Diagnosis not present

## 2022-07-16 DIAGNOSIS — M79604 Pain in right leg: Secondary | ICD-10-CM | POA: Diagnosis not present

## 2022-07-16 DIAGNOSIS — M79605 Pain in left leg: Secondary | ICD-10-CM | POA: Diagnosis not present

## 2022-07-16 DIAGNOSIS — M542 Cervicalgia: Secondary | ICD-10-CM | POA: Diagnosis not present

## 2022-07-16 DIAGNOSIS — G894 Chronic pain syndrome: Secondary | ICD-10-CM | POA: Diagnosis not present

## 2022-07-16 DIAGNOSIS — M79662 Pain in left lower leg: Secondary | ICD-10-CM | POA: Diagnosis not present

## 2022-08-13 DIAGNOSIS — M79622 Pain in left upper arm: Secondary | ICD-10-CM | POA: Diagnosis not present

## 2022-08-13 DIAGNOSIS — M545 Low back pain, unspecified: Secondary | ICD-10-CM | POA: Diagnosis not present

## 2022-08-13 DIAGNOSIS — M79605 Pain in left leg: Secondary | ICD-10-CM | POA: Diagnosis not present

## 2022-08-13 DIAGNOSIS — M79661 Pain in right lower leg: Secondary | ICD-10-CM | POA: Diagnosis not present

## 2022-08-13 DIAGNOSIS — M79604 Pain in right leg: Secondary | ICD-10-CM | POA: Diagnosis not present

## 2022-08-13 DIAGNOSIS — M542 Cervicalgia: Secondary | ICD-10-CM | POA: Diagnosis not present

## 2022-08-13 DIAGNOSIS — M79621 Pain in right upper arm: Secondary | ICD-10-CM | POA: Diagnosis not present

## 2022-08-13 DIAGNOSIS — Z79891 Long term (current) use of opiate analgesic: Secondary | ICD-10-CM | POA: Diagnosis not present

## 2022-08-13 DIAGNOSIS — M79662 Pain in left lower leg: Secondary | ICD-10-CM | POA: Diagnosis not present

## 2022-08-13 DIAGNOSIS — G894 Chronic pain syndrome: Secondary | ICD-10-CM | POA: Diagnosis not present

## 2022-08-13 DIAGNOSIS — M5136 Other intervertebral disc degeneration, lumbar region: Secondary | ICD-10-CM | POA: Diagnosis not present

## 2022-09-08 DIAGNOSIS — R202 Paresthesia of skin: Secondary | ICD-10-CM | POA: Diagnosis not present

## 2022-09-08 DIAGNOSIS — I10 Essential (primary) hypertension: Secondary | ICD-10-CM | POA: Diagnosis not present

## 2022-09-08 DIAGNOSIS — G43909 Migraine, unspecified, not intractable, without status migrainosus: Secondary | ICD-10-CM | POA: Diagnosis not present

## 2022-09-08 DIAGNOSIS — Q245 Malformation of coronary vessels: Secondary | ICD-10-CM | POA: Diagnosis not present

## 2022-09-08 DIAGNOSIS — Z23 Encounter for immunization: Secondary | ICD-10-CM | POA: Diagnosis not present

## 2022-09-08 DIAGNOSIS — G4700 Insomnia, unspecified: Secondary | ICD-10-CM | POA: Diagnosis not present

## 2022-09-10 DIAGNOSIS — M542 Cervicalgia: Secondary | ICD-10-CM | POA: Diagnosis not present

## 2022-09-10 DIAGNOSIS — M79621 Pain in right upper arm: Secondary | ICD-10-CM | POA: Diagnosis not present

## 2022-09-10 DIAGNOSIS — M79622 Pain in left upper arm: Secondary | ICD-10-CM | POA: Diagnosis not present

## 2022-09-10 DIAGNOSIS — M545 Low back pain, unspecified: Secondary | ICD-10-CM | POA: Diagnosis not present

## 2022-09-10 DIAGNOSIS — Z79891 Long term (current) use of opiate analgesic: Secondary | ICD-10-CM | POA: Diagnosis not present

## 2022-09-10 DIAGNOSIS — M79661 Pain in right lower leg: Secondary | ICD-10-CM | POA: Diagnosis not present

## 2022-09-10 DIAGNOSIS — M79605 Pain in left leg: Secondary | ICD-10-CM | POA: Diagnosis not present

## 2022-09-10 DIAGNOSIS — G894 Chronic pain syndrome: Secondary | ICD-10-CM | POA: Diagnosis not present

## 2022-09-10 DIAGNOSIS — M79662 Pain in left lower leg: Secondary | ICD-10-CM | POA: Diagnosis not present

## 2022-09-10 DIAGNOSIS — M79604 Pain in right leg: Secondary | ICD-10-CM | POA: Diagnosis not present

## 2022-10-03 DIAGNOSIS — M79605 Pain in left leg: Secondary | ICD-10-CM | POA: Diagnosis not present

## 2022-10-03 DIAGNOSIS — M79661 Pain in right lower leg: Secondary | ICD-10-CM | POA: Diagnosis not present

## 2022-10-03 DIAGNOSIS — M79622 Pain in left upper arm: Secondary | ICD-10-CM | POA: Diagnosis not present

## 2022-10-03 DIAGNOSIS — M79621 Pain in right upper arm: Secondary | ICD-10-CM | POA: Diagnosis not present

## 2022-10-03 DIAGNOSIS — Z79891 Long term (current) use of opiate analgesic: Secondary | ICD-10-CM | POA: Diagnosis not present

## 2022-10-03 DIAGNOSIS — M545 Low back pain, unspecified: Secondary | ICD-10-CM | POA: Diagnosis not present

## 2022-10-03 DIAGNOSIS — M79604 Pain in right leg: Secondary | ICD-10-CM | POA: Diagnosis not present

## 2022-10-03 DIAGNOSIS — G894 Chronic pain syndrome: Secondary | ICD-10-CM | POA: Diagnosis not present

## 2022-10-03 DIAGNOSIS — M79662 Pain in left lower leg: Secondary | ICD-10-CM | POA: Diagnosis not present

## 2022-10-03 DIAGNOSIS — M542 Cervicalgia: Secondary | ICD-10-CM | POA: Diagnosis not present

## 2022-10-16 ENCOUNTER — Ambulatory Visit: Payer: Medicare HMO | Attending: Cardiology | Admitting: Cardiology

## 2022-10-16 ENCOUNTER — Encounter: Payer: Self-pay | Admitting: Cardiology

## 2022-10-16 VITALS — BP 132/72 | HR 64 | Ht 67.0 in | Wt 183.0 lb

## 2022-10-16 DIAGNOSIS — I2510 Atherosclerotic heart disease of native coronary artery without angina pectoris: Secondary | ICD-10-CM | POA: Diagnosis not present

## 2022-10-16 DIAGNOSIS — I5022 Chronic systolic (congestive) heart failure: Secondary | ICD-10-CM | POA: Diagnosis not present

## 2022-10-16 NOTE — Patient Instructions (Signed)
Medication Instructions:  Your physician recommends that you continue on your current medications as directed. Please refer to the Current Medication list given to you today.   Labwork: None  Testing/Procedures: None  Follow-Up: Follow up in 6 months with Dr. Harl Bowie.  Any Other Special Instructions Will Be Listed Below (If Applicable).   Please call our office to clarify your medications.   If you need a refill on your cardiac medications before your next appointment, please call your pharmacy.

## 2022-10-16 NOTE — Progress Notes (Signed)
Clinical Summary Jim Martin is a 66 y.o.male seen today for follow up of the following medical problems.    1. CAD - NSTEMI 04/2020, received DES to LAD. Distal LAD 70% treated medically  - no chest pains, no SOB/DOE - taking home meds, unsure of names.    2. Chronic systolic HF - echo 02/8100 LVEF 35-40% - 06/2020 echo LVEF 50-55%,    - he is compliant meds.    3. Apical thrombus - noted by 04/2020 echo - from 06/2020 no evidence of ongoing thrombus   4. LEg pains - bilateral leg pains and muscle aches, pain swelling in hands.  - of note high dose statin was new during 04/2020 admission    5.Syncope - event monitor was benign - symptoms improved off gabapentin  - no recurrent episodes  6. HTN - compliant with meds  7. Hyperlipidemia   Past Medical History:  Diagnosis Date   Anxiety    Basilar migraine 10/05/2013   CAD (coronary artery disease)    a. s/p NSTEMI in 04/2020 with DES x2 to LAD and residual 70% distal LAD stenosis   Chronic low back pain    Coronary artery disease    Hypertension    Ischemic cardiomyopathy    a. EF 35-40% by echo in 04/2020 with apical thrombus noted b. Apical thrombus resolved and EF improved to 50-55% by echo in 06/2020     No Known Allergies   Current Outpatient Medications  Medication Sig Dispense Refill   amLODipine (NORVASC) 10 MG tablet Take 10 mg by mouth at bedtime.     aspirin EC 81 MG tablet Take 1 tablet (81 mg total) by mouth daily. Swallow whole. 90 tablet 3   atorvastatin (LIPITOR) 40 MG tablet TAKE (1) TABLET BY MOUTH ONCE A DAY.. 90 tablet 2   DULoxetine (CYMBALTA) 30 MG capsule Take 30 mg by mouth daily.     famotidine (PEPCID) 20 MG tablet Take 1 tablet (20 mg total) by mouth 2 (two) times daily. 30 tablet 0   folic acid (FOLVITE) 1 MG tablet Take 1 mg by mouth daily.     gabapentin (NEURONTIN) 300 MG capsule Take 300 mg by mouth 3 (three) times daily.     methocarbamol (ROBAXIN) 750 MG tablet Take 750 mg  by mouth 3 (three) times daily.     metoprolol succinate (TOPROL-XL) 25 MG 24 hr tablet TAKE (1) TABLET BY MOUTH ONCE A DAY. 90 tablet 2   nitroGLYCERIN (NITROSTAT) 0.4 MG SL tablet Place 1 tablet (0.4 mg total) under the tongue every 5 (five) minutes as needed for chest pain (CP or SOB). 25 tablet 3   oxyCODONE (ROXICODONE) 15 MG immediate release tablet Take 15 mg by mouth 5 (five) times daily.  0   pantoprazole (PROTONIX) 40 MG tablet Take 40 mg by mouth daily.     topiramate (TOPAMAX) 100 MG tablet Take 1 tablet (100 mg total) by mouth at bedtime. 90 tablet 3   zolpidem (AMBIEN) 10 MG tablet Take 10 mg by mouth at bedtime as needed for sleep.     No current facility-administered medications for this visit.     Past Surgical History:  Procedure Laterality Date   BRAIN SURGERY     COLONOSCOPY  11/2009   Dr. Oletta Lamas: medium-sized internal hemorrhoids, normal    CORONARY STENT INTERVENTION N/A 04/16/2020   Procedure: CORONARY STENT INTERVENTION;  Surgeon: Jim Sine, MD;  Location: [facility];  Service: Cardiovascular;  Laterality: N/A;   LEFT HEART CATH AND CORONARY ANGIOGRAPHY N/A 04/16/2020   Procedure: LEFT HEART CATH AND CORONARY ANGIOGRAPHY;  Surgeon: Jim Sine, MD;  Location: [facility];  Service: Cardiovascular;  Laterality: N/A;   Lumbosacral spine surgery     SPINAL CORD STIMULATOR IMPLANT       No Known Allergies    Family History  Problem Relation Age of Onset   Cancer Mother    Heart attack Father    Heart attack Brother    Colon cancer Neg Hx      Social History Jim Martin reports that he quit smoking about 2 years ago. His smoking use included cigarettes. He smoked an average of 1.5 packs per day. He has never used smokeless tobacco. Jim Martin reports no history of alcohol use.   Review of Systems CONSTITUTIONAL: No weight loss, fever, chills, weakness or fatigue.  HEENT: Eyes: No visual loss, blurred vision, double vision or yellow  sclerae.No hearing loss, sneezing, congestion, runny nose or sore throat.  SKIN: No rash or itching.  CARDIOVASCULAR: per hpi RESPIRATORY: No shortness of breath, cough or sputum.  GASTROINTESTINAL: No anorexia, nausea, vomiting or diarrhea. No abdominal pain or blood.  GENITOURINARY: No burning on urination, no polyuria NEUROLOGICAL: No headache, dizziness, syncope, paralysis, ataxia, numbness or tingling in the extremities. No change in bowel or bladder control.  MUSCULOSKELETAL: No muscle, back pain, joint pain or stiffness.  LYMPHATICS: No enlarged nodes. No history of splenectomy.  PSYCHIATRIC: No history of depression or anxiety.  ENDOCRINOLOGIC: No reports of sweating, cold or heat intolerance. No polyuria or polydipsia.  Marland Kitchen   Physical Examination Today's Vitals   10/16/22 1138  BP: 132/72  Pulse: 64  SpO2: 99%  Weight: 183 lb (83 kg)  Height: '5\' 7"'$  (1.702 m)   Body mass index is 28.66 kg/m.  Gen: resting comfortably, no acute distress HEENT: no scleral icterus, pupils equal round and reactive, no palptable cervical adenopathy,  CV: RRR, no m/rg no jvd Resp: Clear to auscultation bilaterally GI: abdomen is soft, non-tender, non-distended, normal bowel sounds, no hepatosplenomegaly MSK: extremities are warm, no edema.  Skin: warm, no rash Neuro:  no focal deficits Psych: appropriate affect   Diagnostic Studies  06/2020 echo IMPRESSIONS     1. Left ventricular ejection fraction, by estimation, is 50 to 55%. The  left ventricle has low normal function. The left ventricle demonstrates  mild global hypokinesis. Left ventricular diastolic parameters are  consistent with Grade I diastolic  dysfunction (impaired relaxation).   2. No definite LV mural thrombus with or without Definity contrast.   3. Right ventricular systolic function is normal. The right ventricular  size is normal. Tricuspid regurgitation signal is inadequate for assessing  PA pressure.   4. Left  atrial size was upper normal.   5. The mitral valve is grossly normal. Mild mitral valve regurgitation.   6. The aortic valve is tricuspid. Aortic valve regurgitation is not  visualized.   7. The inferior vena cava is normal in size with greater than 50%  respiratory variability, suggesting right atrial pressure of 3 mmHg.        Jan 2017 nuclear stress There was no ST segment deviation noted during stress. This is a low risk study. The left ventricular ejection fraction is normal (55-65%). Inferior defect likely secondary to subdiaphragmatic attenuation and gut radiotracer uptake artifact. Cannot rule prior inferior infarct. No evidence of any myocardium currently at jeopardy. Overall  findings remain low risk.     04/2020 cath Mid LAD-1 lesion is 75% stenosed. Mid LAD-2 lesion is 99% stenosed. Mid LAD to Dist LAD lesion is 95% stenosed. Dist LAD lesion is 70% stenosed. Post intervention, there is a 0% residual stenosis. Post intervention, there is a 0% residual stenosis. A stent was successfully placed. Post intervention, there is a 0% residual stenosis.   Acute coronary syndrome/non-STEMI secondary to 3 tandem stenoses in a large LAD system with 70% proximal at site of ulcerated plaque, 99% in the proximal to mid and 95% eccentric mid stenosis, and 70% smooth distal LAD stenosis; relatively normal left circumflex system; RCA with 70% proximal and 20% mid stenoses.   LVEDP 21 mmHg.   Successful percutaneous coronary intervention of the tandem LAD lesions with ultimate insertion of a 2.75 x 38 mm stent from distal to proximal and 3.0 x 12 mm Resolute Onyx DES stent with stent overlap to cover the most proximal stenosis.  All stenoses were reduced to 0%.  There was brisk TIMI-3 flow.  There was no change in the distal 70% LAD stenosis.   POST CATH RECOMMENDATION: DAPT for minimum of 1 year.  Smoking cessation is essential.  Aggressive lipid intervention with target LDL less than 70.   Optimal blood pressure control.  An echo Doppler study will be done to assess LV function.       Assessment and Plan   1. CAD - no symptoms, continue current meds   2. Apical thrombus - resolved by recent echo, he is now off eliquis   3. Chronic systolic HF - LVEF has since normalized by recent echo - no recent symptoms, continue current meds   He will call us from home to clarify his home meds    Arnoldo Lenis, M.D.

## 2022-10-20 ENCOUNTER — Encounter: Payer: Self-pay | Admitting: Internal Medicine

## 2022-10-31 ENCOUNTER — Other Ambulatory Visit: Payer: Self-pay | Admitting: Adult Health

## 2022-10-31 DIAGNOSIS — M79661 Pain in right lower leg: Secondary | ICD-10-CM | POA: Diagnosis not present

## 2022-10-31 DIAGNOSIS — M5136 Other intervertebral disc degeneration, lumbar region: Secondary | ICD-10-CM | POA: Diagnosis not present

## 2022-10-31 DIAGNOSIS — M79605 Pain in left leg: Secondary | ICD-10-CM | POA: Diagnosis not present

## 2022-10-31 DIAGNOSIS — Z79891 Long term (current) use of opiate analgesic: Secondary | ICD-10-CM | POA: Diagnosis not present

## 2022-10-31 DIAGNOSIS — R7303 Prediabetes: Secondary | ICD-10-CM | POA: Diagnosis not present

## 2022-10-31 DIAGNOSIS — M545 Low back pain, unspecified: Secondary | ICD-10-CM | POA: Diagnosis not present

## 2022-10-31 DIAGNOSIS — M542 Cervicalgia: Secondary | ICD-10-CM | POA: Diagnosis not present

## 2022-10-31 DIAGNOSIS — M79662 Pain in left lower leg: Secondary | ICD-10-CM | POA: Diagnosis not present

## 2022-10-31 DIAGNOSIS — M79604 Pain in right leg: Secondary | ICD-10-CM | POA: Diagnosis not present

## 2022-10-31 DIAGNOSIS — G894 Chronic pain syndrome: Secondary | ICD-10-CM | POA: Diagnosis not present

## 2022-11-04 NOTE — Telephone Encounter (Signed)
Spoke with patient. Per last office note, pt was advised he can get further refills from PCP and follow-up with neurology PRN. Pt states he will call his PCP and ask for refill. If he cannot get refill until he sees PCP again in 2 months then we could give a temporarily refill to hold him over. He will call us back if needed. Pt verbalized appreciation for the call.

## 2022-11-28 DIAGNOSIS — Z79891 Long term (current) use of opiate analgesic: Secondary | ICD-10-CM | POA: Diagnosis not present

## 2022-11-28 DIAGNOSIS — M545 Low back pain, unspecified: Secondary | ICD-10-CM | POA: Diagnosis not present

## 2022-11-28 DIAGNOSIS — M79605 Pain in left leg: Secondary | ICD-10-CM | POA: Diagnosis not present

## 2022-11-28 DIAGNOSIS — M542 Cervicalgia: Secondary | ICD-10-CM | POA: Diagnosis not present

## 2022-11-28 DIAGNOSIS — G894 Chronic pain syndrome: Secondary | ICD-10-CM | POA: Diagnosis not present

## 2022-11-28 DIAGNOSIS — M79604 Pain in right leg: Secondary | ICD-10-CM | POA: Diagnosis not present

## 2022-11-28 DIAGNOSIS — M79662 Pain in left lower leg: Secondary | ICD-10-CM | POA: Diagnosis not present

## 2022-11-28 DIAGNOSIS — M79621 Pain in right upper arm: Secondary | ICD-10-CM | POA: Diagnosis not present

## 2022-11-28 DIAGNOSIS — M79622 Pain in left upper arm: Secondary | ICD-10-CM | POA: Diagnosis not present

## 2022-11-28 DIAGNOSIS — M79661 Pain in right lower leg: Secondary | ICD-10-CM | POA: Diagnosis not present

## 2022-12-16 ENCOUNTER — Other Ambulatory Visit: Payer: Self-pay | Admitting: Adult Health

## 2022-12-17 NOTE — Telephone Encounter (Signed)
Spoke with patient. He states he reached out to PCP 2 months ago about taking over Topiramate refills and hasn't heard back yet. He ran out about 2 weeks ago. His headaches have returned but denies any other issues after running out of the medication. He appreciates a temporary refill until he sees PCP next month. Refill sent to pharmacy. Pt aware we will not be abel to continue sending in refills without seeing him. He will talk to his PCP.

## 2023-01-05 DIAGNOSIS — G4700 Insomnia, unspecified: Secondary | ICD-10-CM | POA: Diagnosis not present

## 2023-01-05 DIAGNOSIS — K219 Gastro-esophageal reflux disease without esophagitis: Secondary | ICD-10-CM | POA: Diagnosis not present

## 2023-01-05 DIAGNOSIS — G894 Chronic pain syndrome: Secondary | ICD-10-CM | POA: Diagnosis not present

## 2023-01-05 DIAGNOSIS — Q245 Malformation of coronary vessels: Secondary | ICD-10-CM | POA: Diagnosis not present

## 2023-01-05 DIAGNOSIS — I5022 Chronic systolic (congestive) heart failure: Secondary | ICD-10-CM | POA: Diagnosis not present

## 2023-01-05 DIAGNOSIS — G43909 Migraine, unspecified, not intractable, without status migrainosus: Secondary | ICD-10-CM | POA: Diagnosis not present

## 2023-01-05 DIAGNOSIS — I10 Essential (primary) hypertension: Secondary | ICD-10-CM | POA: Diagnosis not present

## 2023-01-09 DIAGNOSIS — M79661 Pain in right lower leg: Secondary | ICD-10-CM | POA: Diagnosis not present

## 2023-01-09 DIAGNOSIS — G894 Chronic pain syndrome: Secondary | ICD-10-CM | POA: Diagnosis not present

## 2023-01-09 DIAGNOSIS — M79604 Pain in right leg: Secondary | ICD-10-CM | POA: Diagnosis not present

## 2023-01-09 DIAGNOSIS — Z79891 Long term (current) use of opiate analgesic: Secondary | ICD-10-CM | POA: Diagnosis not present

## 2023-01-09 DIAGNOSIS — M79662 Pain in left lower leg: Secondary | ICD-10-CM | POA: Diagnosis not present

## 2023-01-09 DIAGNOSIS — M545 Low back pain, unspecified: Secondary | ICD-10-CM | POA: Diagnosis not present

## 2023-01-09 DIAGNOSIS — M542 Cervicalgia: Secondary | ICD-10-CM | POA: Diagnosis not present

## 2023-01-09 DIAGNOSIS — M79622 Pain in left upper arm: Secondary | ICD-10-CM | POA: Diagnosis not present

## 2023-01-09 DIAGNOSIS — M79605 Pain in left leg: Secondary | ICD-10-CM | POA: Diagnosis not present

## 2023-01-09 DIAGNOSIS — M79621 Pain in right upper arm: Secondary | ICD-10-CM | POA: Diagnosis not present

## 2023-02-06 DIAGNOSIS — G894 Chronic pain syndrome: Secondary | ICD-10-CM | POA: Diagnosis not present

## 2023-02-06 DIAGNOSIS — M79662 Pain in left lower leg: Secondary | ICD-10-CM | POA: Diagnosis not present

## 2023-02-06 DIAGNOSIS — M79604 Pain in right leg: Secondary | ICD-10-CM | POA: Diagnosis not present

## 2023-02-06 DIAGNOSIS — M542 Cervicalgia: Secondary | ICD-10-CM | POA: Diagnosis not present

## 2023-02-06 DIAGNOSIS — M79661 Pain in right lower leg: Secondary | ICD-10-CM | POA: Diagnosis not present

## 2023-02-06 DIAGNOSIS — M79605 Pain in left leg: Secondary | ICD-10-CM | POA: Diagnosis not present

## 2023-02-06 DIAGNOSIS — M545 Low back pain, unspecified: Secondary | ICD-10-CM | POA: Diagnosis not present

## 2023-02-06 DIAGNOSIS — M79622 Pain in left upper arm: Secondary | ICD-10-CM | POA: Diagnosis not present

## 2023-02-06 DIAGNOSIS — M79621 Pain in right upper arm: Secondary | ICD-10-CM | POA: Diagnosis not present

## 2023-02-06 DIAGNOSIS — Z79891 Long term (current) use of opiate analgesic: Secondary | ICD-10-CM | POA: Diagnosis not present

## 2023-04-15 ENCOUNTER — Encounter: Payer: Self-pay | Admitting: Student

## 2023-04-15 ENCOUNTER — Ambulatory Visit: Payer: Medicare HMO | Attending: Student | Admitting: Student

## 2023-04-15 ENCOUNTER — Encounter: Payer: Self-pay | Admitting: *Deleted

## 2023-04-15 VITALS — BP 154/100 | HR 82 | Ht 69.0 in | Wt 178.0 lb

## 2023-04-15 DIAGNOSIS — I2510 Atherosclerotic heart disease of native coronary artery without angina pectoris: Secondary | ICD-10-CM

## 2023-04-15 DIAGNOSIS — Z8679 Personal history of other diseases of the circulatory system: Secondary | ICD-10-CM | POA: Diagnosis not present

## 2023-04-15 DIAGNOSIS — E785 Hyperlipidemia, unspecified: Secondary | ICD-10-CM | POA: Diagnosis not present

## 2023-04-15 DIAGNOSIS — I10 Essential (primary) hypertension: Secondary | ICD-10-CM | POA: Diagnosis not present

## 2023-04-15 MED ORDER — METOPROLOL SUCCINATE ER 50 MG PO TB24: 50.0000 mg | ORAL_TABLET | Freq: Every day | ORAL | 3 refills | Status: AC

## 2023-04-15 NOTE — Patient Instructions (Signed)
Medication Instructions:   Increase Toprol-XL to 50mg  daily (can take two 25mg  tablets) until you pick up the new pills.   Return blood pressure log in 2-3 weeks.   *If you need a refill on your cardiac medications before your next appointment, please call your pharmacy*    Follow-Up: At [facility], you and your health needs are our priority.  As part of our continuing mission to provide you with exceptional heart care, we have created designated Provider Care Teams.  These Care Teams include your primary Cardiologist (physician) and Advanced Practice Providers (APPs -  Physician Assistants and Nurse Practitioners) who all work together to provide you with the care you need, when you need it.  We recommend signing up for the patient portal called "MyChart".  Sign up information is provided on this After Visit Summary.  MyChart is used to connect with patients for Virtual Visits (Telemedicine).  Patients are able to view lab/test results, encounter notes, upcoming appointments, etc.  Non-urgent messages can be sent to your provider as well.   To learn more about what you can do with MyChart, go to [email or web address].    Your next appointment:   6 month(s)  Provider:   You may see Dina Rich, MD or one of the following Advanced Practice Providers on your designated Care Team:   [place], PA-C  Jacolyn Reedy, [place]

## 2023-04-15 NOTE — Progress Notes (Signed)
Cardiology Office Note    Date:  04/15/2023  ID:  Jim Martin, DOB 1956-01-21, MRN [id number] Cardiologist: Dina Rich, MD    History of Present Illness:    Jim Martin is a 67 y.o. male with past medical history of CAD (s/p NSTEMI in 04/2020 with DES x2 to LAD and residual 70% distal LAD stenosis), HFimpEF (EF 35-40% by echo in 04/2020, improved to 50-55% by echo in 06/2020), apical thrombus (by echo in 04/2020 and resolved on follow-up imaging) HTN, HLD, migraine headaches and prior tobacco use who presents for 39-month follow-up.   He was last examined by Dr. Wyline Mood in 10/2022 and denied any recent anginal symptoms at that time. He was continued on his current cardiac medications with Amlodipine 10mg  daily, ASA 81mg  daily, Atorvastatin 40mg  daily, Toprol-XL 25mg  daily and PRN SL NTG.   In talking with the patient today, he reports his main limiting factor continues to be significant back pain which radiates into his legs bilaterally. He is being followed by Pain Management and is hopeful he can have his spinal stimulator removed in the future so he can have an MRI for follow-up imaging. From a cardiac perspective, he has overall been doing well and denies any recent exertional chest pain or progressive dyspnea. No specific orthopnea, PND or pitting edema. Reports his BP has been elevated when checked by his PCP and pain management but no adjustments have been made to medical therapy.  Studies Reviewed:   EKG: EKG is not ordered today. EKG from 10/16/2022 is reviewed and shows normal sinus rhythm, heart rate 67 with no acute ST changes.  LHC: 04/2020 Mid LAD-1 lesion is 75% stenosed. Mid LAD-2 lesion is 99% stenosed. Mid LAD to Dist LAD lesion is 95% stenosed. Dist LAD lesion is 70% stenosed. Post intervention, there is a 0% residual stenosis. Post intervention, there is a 0% residual stenosis. A stent was successfully placed. Post intervention, there is a 0% residual  stenosis.   Acute coronary syndrome/non-STEMI secondary to 3 tandem stenoses in a large LAD system with 70% proximal at site of ulcerated plaque, 99% in the proximal to mid and 95% eccentric mid stenosis, and 70% smooth distal LAD stenosis; relatively normal left circumflex system; RCA with 70% proximal and 20% mid stenoses.   LVEDP 21 mmHg.   Successful percutaneous coronary intervention of the tandem LAD lesions with ultimate insertion of a 2.75 x 38 mm stent from distal to proximal and 3.0 x 12 mm Resolute Onyx DES stent with stent overlap to cover the most proximal stenosis.  All stenoses were reduced to 0%.  There was brisk TIMI-3 flow.  There was no change in the distal 70% LAD stenosis.   RECOMMENDATION: DAPT for minimum of 1 year.  Smoking cessation is essential.  Aggressive lipid intervention with target LDL less than 70.  Optimal blood pressure control.  An echo Doppler study will be done to assess LV function.   Event Monitor: 05/2021 Rare supraventricular ectopy Rare ventricular ectopy No reported symptoms No significant arrhythmias     Patch Wear Time:  12 days and 6 hours (2022-05-12T16:26:29-0400 to 2022-05-24T22:56:57-0400)   Patient had a min HR of 42 bpm, max HR of 136 bpm, and avg HR of 64 bpm. Predominant underlying rhythm was Sinus Rhythm. Isolated SVEs were rare (<1.0%), SVE Couplets were rare (<1.0%), and SVE Triplets were rare (<1.0%). Isolated VEs were rare (<1.0%),  VE Couplets were rare (<1.0%), and no VE Triplets were present. Inverted  QRS complexes possibly due to inverted placement of device.   Physical Exam:   VS:  BP (!) 154/100   Pulse 82   Ht 5\' 9"  (1.753 m)   Wt 178 lb (80.7 kg)   SpO2 98%   BMI 26.29 kg/m    Wt Readings from Last 3 Encounters:  04/15/23 178 lb (80.7 kg)  10/16/22 183 lb (83 kg)  10/22/21 177 lb (80.3 kg)     GEN: Well nourished, well developed male appearing in no acute distress NECK: No JVD; No carotid bruits CARDIAC: RRR,  no murmurs, rubs, gallops RESPIRATORY:  Clear to auscultation without rales, wheezing or rhonchi  ABDOMEN: Appears non-distended. No obvious abdominal masses. EXTREMITIES: No clubbing or cyanosis. No pitting edema.  Distal pedal pulses are 2+ bilaterally.   Assessment and Plan:   1. CAD - He is s/p NSTEMI in 04/2020 with DES x2 to the LAD. He denies any recent anginal symptoms and does remain active at baseline despite his back pain. - Continue current medical therapy with ASA 81 mg daily, Atorvastatin 40 mg daily and Toprol-XL with dose adjustment as outlined below to 50 mg daily in the setting of elevated BP.  2. HFimpEF - His EF was previously at 35-40% in 2021 and had improved to 50-55% by repeat imaging. He denies any orthopnea, PND or pitting edema and appears euvolemic by examination today. Continue Toprol-XL with dose adjustment as outlined below.  3. HTN - His BP was initially recorded at 154/100, rechecked and at 160/92. He is currently on Amlodipine 10 mg daily and Toprol-XL 25 mg daily. He prefers to adjust his current regimen first, therefore will titrate Toprol-XL to 50 mg daily. He was provided with a BP log and will return this in several weeks. If BP remains elevated, would plan to add Losartan.   4. HLD - FLP in 2023 showed his LDL was at 49. Continue Atorvastatin 40mg  daily.    Signed, Ellsworth Lennox, PA-C

## 2023-07-13 ENCOUNTER — Other Ambulatory Visit: Payer: Self-pay | Admitting: Internal Medicine

## 2023-07-29 ENCOUNTER — Emergency Department (HOSPITAL_COMMUNITY): Payer: Medicare HMO

## 2023-07-29 ENCOUNTER — Encounter (HOSPITAL_COMMUNITY): Payer: Self-pay | Admitting: *Deleted

## 2023-07-29 ENCOUNTER — Emergency Department (HOSPITAL_COMMUNITY)
Admission: EM | Admit: 2023-07-29 | Discharge: 2023-07-29 | Disposition: A | Discharge status: Home / Self Care | Payer: Medicare HMO | Attending: Emergency Medicine | Admitting: Emergency Medicine

## 2023-07-29 ENCOUNTER — Other Ambulatory Visit: Payer: Self-pay

## 2023-07-29 DIAGNOSIS — Z79899 Other long term (current) drug therapy: Secondary | ICD-10-CM | POA: Diagnosis not present

## 2023-07-29 DIAGNOSIS — R7401 Elevation of levels of liver transaminase levels: Secondary | ICD-10-CM | POA: Diagnosis not present

## 2023-07-29 DIAGNOSIS — I10 Essential (primary) hypertension: Secondary | ICD-10-CM | POA: Diagnosis not present

## 2023-07-29 DIAGNOSIS — Z955 Presence of coronary angioplasty implant and graft: Secondary | ICD-10-CM | POA: Diagnosis not present

## 2023-07-29 DIAGNOSIS — Z20822 Contact with and (suspected) exposure to covid-19: Secondary | ICD-10-CM | POA: Insufficient documentation

## 2023-07-29 DIAGNOSIS — Z87891 Personal history of nicotine dependence: Secondary | ICD-10-CM | POA: Diagnosis not present

## 2023-07-29 DIAGNOSIS — R109 Unspecified abdominal pain: Secondary | ICD-10-CM | POA: Diagnosis present

## 2023-07-29 DIAGNOSIS — I2510 Atherosclerotic heart disease of native coronary artery without angina pectoris: Secondary | ICD-10-CM | POA: Insufficient documentation

## 2023-07-29 DIAGNOSIS — R0600 Dyspnea, unspecified: Secondary | ICD-10-CM | POA: Diagnosis not present

## 2023-07-29 DIAGNOSIS — K859 Acute pancreatitis without necrosis or infection, unspecified: Secondary | ICD-10-CM

## 2023-07-29 DIAGNOSIS — Z7982 Long term (current) use of aspirin: Secondary | ICD-10-CM | POA: Insufficient documentation

## 2023-07-29 DIAGNOSIS — R0602 Shortness of breath: Secondary | ICD-10-CM | POA: Diagnosis not present

## 2023-07-29 LAB — URINALYSIS, ROUTINE W REFLEX MICROSCOPIC
Bacteria, UA: NONE SEEN
Bilirubin Urine: NEGATIVE
Glucose, UA: NEGATIVE mg/dL
Ketones, ur: NEGATIVE mg/dL
Leukocytes,Ua: NEGATIVE
Nitrite: NEGATIVE
Protein, ur: NEGATIVE mg/dL
Specific Gravity, Urine: 1.03 (ref 1.005–1.030)
pH: 5 (ref 5.0–8.0)

## 2023-07-29 LAB — CBC WITH DIFFERENTIAL/PLATELET
Abs Immature Granulocytes: 0.04 10*3/uL (ref 0.00–0.07)
Basophils Absolute: 0.1 10*3/uL (ref 0.0–0.1)
Basophils Relative: 0 %
Eosinophils Absolute: 0 10*3/uL (ref 0.0–0.5)
Eosinophils Relative: 0 %
HCT: 41.4 % (ref 39.0–52.0)
Hemoglobin: 14 g/dL (ref 13.0–17.0)
Immature Granulocytes: 0 %
Lymphocytes Relative: 3 %
Lymphs Abs: 0.5 10*3/uL — ABNORMAL LOW (ref 0.7–4.0)
MCH: 32.6 pg (ref 26.0–34.0)
MCHC: 33.8 g/dL (ref 30.0–36.0)
MCV: 96.3 fL (ref 80.0–100.0)
Monocytes Absolute: 0.5 10*3/uL (ref 0.1–1.0)
Monocytes Relative: 3 %
Neutro Abs: 14.5 10*3/uL — ABNORMAL HIGH (ref 1.7–7.7)
Neutrophils Relative %: 94 %
Platelets: 203 10*3/uL (ref 150–400)
RBC: 4.3 MIL/uL (ref 4.22–5.81)
RDW: 13.9 % (ref 11.5–15.5)
WBC: 15.6 10*3/uL — ABNORMAL HIGH (ref 4.0–10.5)
nRBC: 0 % (ref 0.0–0.2)

## 2023-07-29 LAB — COMPREHENSIVE METABOLIC PANEL
ALT: 286 U/L — ABNORMAL HIGH (ref 0–44)
AST: 402 U/L — ABNORMAL HIGH (ref 15–41)
Albumin: 3.9 g/dL (ref 3.5–5.0)
Alkaline Phosphatase: 122 U/L (ref 38–126)
Anion gap: 7 (ref 5–15)
BUN: 16 mg/dL (ref 8–23)
CO2: 23 mmol/L (ref 22–32)
Calcium: 9.5 mg/dL (ref 8.9–10.3)
Chloride: 106 mmol/L (ref 98–111)
Creatinine, Ser: 1.16 mg/dL (ref 0.61–1.24)
GFR, Estimated: >60 mL/min (ref >60)
Glucose, Bld: 151 mg/dL — ABNORMAL HIGH (ref 70–99)
Potassium: 2.9 mmol/L — ABNORMAL LOW (ref 3.5–5.1)
Sodium: 136 mmol/L (ref 135–145)
Total Bilirubin: 2.9 mg/dL — ABNORMAL HIGH (ref 0.3–1.2)
Total Protein: 6.7 g/dL (ref 6.5–8.1)

## 2023-07-29 LAB — RESP PANEL BY RT-PCR (RSV, FLU A&B, COVID)  RVPGX2
Influenza A by PCR: NEGATIVE
Influenza B by PCR: NEGATIVE
Resp Syncytial Virus by PCR: NEGATIVE
SARS Coronavirus 2 by RT PCR: NEGATIVE

## 2023-07-29 LAB — TROPONIN I (HIGH SENSITIVITY)
Troponin I (High Sensitivity): 5 ng/L (ref <18)
Troponin I (High Sensitivity): 5 ng/L (ref <18)

## 2023-07-29 LAB — BRAIN NATRIURETIC PEPTIDE: B Natriuretic Peptide: 47 pg/mL (ref 0.0–100.0)

## 2023-07-29 LAB — LIPASE, BLOOD: Lipase: 930 U/L — ABNORMAL HIGH (ref 11–51)

## 2023-07-29 MED ORDER — ONDANSETRON HCL 4 MG PO TABS: 4.0000 mg | ORAL_TABLET | ORAL | 0 refills | Status: AC | PRN

## 2023-07-29 MED ORDER — FAMOTIDINE IN NACL 20-0.9 MG/50ML-% IV SOLN
20.0000 mg | Freq: Once | INTRAVENOUS | Status: AC
  Administered 2023-07-29: 20 mg via INTRAVENOUS
  Filled 2023-07-29: qty 50

## 2023-07-29 MED ORDER — ONDANSETRON HCL 4 MG/2ML IJ SOLN
4.0000 mg | Freq: Once | INTRAMUSCULAR | Status: AC
  Administered 2023-07-29: 4 mg via INTRAVENOUS
  Filled 2023-07-29: qty 2

## 2023-07-29 MED ORDER — IOHEXOL 300 MG/ML  SOLN
100.0000 mL | Freq: Once | INTRAMUSCULAR | Status: AC | PRN
  Administered 2023-07-29: 100 mL via INTRAVENOUS

## 2023-07-29 MED ORDER — MORPHINE SULFATE (PF) 4 MG/ML IV SOLN
4.0000 mg | Freq: Once | INTRAVENOUS | Status: AC
  Administered 2023-07-29: 4 mg via INTRAVENOUS
  Filled 2023-07-29: qty 1

## 2023-07-29 MED ORDER — SODIUM CHLORIDE 0.9 % IV BOLUS
1000.0000 mL | Freq: Once | INTRAVENOUS | Status: AC
  Administered 2023-07-29: 1000 mL via INTRAVENOUS

## 2023-07-29 NOTE — Discharge Instructions (Signed)
Recommend very bland diet over the next 2 weeks.  If you develop fever, yellowing to your skin, worsening pain nausea or vomiting please return for reevaluation.  Otherwise please follow-up with general surgery Dr. Robyne Peers in the office as you likely need to have your gallbladder surgically removed.  It was a pleasure caring for you today in the emergency department.  Please return to the emergency department for any worsening or worrisome symptoms.

## 2023-07-29 NOTE — ED Provider Notes (Signed)
[facility] EMERGENCY DEPARTMENT AT [facility] Provider Note  CSN: [id number] Arrival date & time: 07/29/23 4098  Chief Complaint(s) Chest Pain and Abdominal Pain  HPI ERIL TIMPSON is a 67 y.o. male with past medical history as below, significant for CAD, HTN, ichemic cardiomyopathy who presents to the ED with complaint of abd pain. Onset of pain after dinner last night, intermittent nausea w/ vomiting. Felt pain was so bad it was hard to catch his breath, no longer having any dyspnea- was transient. Pain is epigastric and LLQ, some radiation to mid sternum. Pain sharp/stabbing. Not a/w diarrhea, no melena or BRBPR, no dysuria or urinary changes. No fevers or chills. No ETOH use. No daily nsaid use. Denies similar pain in the past   Past Medical History Past Medical History:  Diagnosis Date   Anxiety    Basilar migraine 10/05/2013   CAD (coronary artery disease)    a. s/p NSTEMI in 04/2020 with DES x2 to LAD and residual 70% distal LAD stenosis   Chronic low back pain    Coronary artery disease    Hypertension    Ischemic cardiomyopathy    a. EF 35-40% by echo in 04/2020 with apical thrombus noted b. Apical thrombus resolved and EF improved to 50-55% by echo in 06/2020   Patient Active Problem List   Diagnosis Date Noted   Hyperlipidemia 04/17/2020   Left ventricular apical thrombus 04/17/2020   Acute combined systolic and diastolic CHF, NYHA class 1 (HCC) 04/17/2020   NSTEMI (non-ST elevated myocardial infarction) (HCC) 04/15/2020   Elevated LFTs 09/13/2016   Chest pain, rule out acute myocardial infarction 12/10/2015   Hypertension 12/10/2015   Chronic low back pain 12/10/2015   Anxiety 12/10/2015   Chest pain of uncertain etiology 12/10/2015   Hyperglycemia 12/10/2015   Basilar migraine 10/05/2013   Home Medication(s) Prior to Admission medications   Medication Sig Start Date End Date Taking? Authorizing Provider  ondansetron (ZOFRAN) 4 MG tablet Take 1 tablet  (4 mg total) by mouth every 4 (four) hours as needed for nausea or vomiting. 07/29/23  Yes Tanda Rockers A, DO  amLODipine (NORVASC) 10 MG tablet Take 10 mg by mouth at bedtime. 10/21/21   [provider]  aspirin EC 81 MG tablet Take 1 tablet (81 mg total) by mouth daily. Swallow whole. 10/04/20   Antoine Poche, MD  atorvastatin (LIPITOR) 40 MG tablet TAKE (1) TABLET BY MOUTH ONCE A DAY.. 01/22/22   Strader, Lennart Pall, PA-C  DULoxetine (CYMBALTA) 30 MG capsule Take 30 mg by mouth daily. 04/03/20   [provider]  famotidine (PEPCID) 20 MG tablet Take 1 tablet (20 mg total) by mouth 2 (two) times daily. 11/11/16   Eber Hong, MD  folic acid (FOLVITE) 1 MG tablet Take 1 mg by mouth daily. 04/03/20   [provider]  gabapentin (NEURONTIN) 300 MG capsule Take 300 mg by mouth 3 (three) times daily. 04/05/20   [provider]  methocarbamol (ROBAXIN) 750 MG tablet Take 750 mg by mouth 3 (three) times daily.    [provider]  metoprolol succinate (TOPROL-XL) 50 MG 24 hr tablet Take 1 tablet (50 mg total) by mouth daily. 04/15/23   Strader, Lennart Pall, PA-C  nitroGLYCERIN (NITROSTAT) 0.4 MG SL tablet Place 1 tablet (0.4 mg total) under the tongue every 5 (five) minutes as needed for chest pain (CP or SOB). 04/17/20   Kroeger, Ovidio Kin., PA-C  oxyCODONE (ROXICODONE) 15 MG immediate release tablet  Take 15 mg by mouth 5 (five) times daily. 02/06/15   [provider]  pantoprazole (PROTONIX) 40 MG tablet Take 40 mg by mouth daily. 04/03/20   [provider]  topiramate (TOPAMAX) 100 MG tablet TAKE ONE TABLET BY MOUTH AT BEDTIME. 12/17/22   Butch Penny, NP  zolpidem (AMBIEN) 10 MG tablet Take 10 mg by mouth at bedtime as needed for sleep.    [provider]                                                                                                                                    Past Surgical History Past Surgical History:   Procedure Laterality Date   BRAIN SURGERY     COLONOSCOPY  11/2009   Dr. Randa Evens: medium-sized internal hemorrhoids, normal    CORONARY STENT INTERVENTION N/A 04/16/2020   Procedure: CORONARY STENT INTERVENTION;  Surgeon: Lennette Bihari, MD;  Location: [facility] INVASIVE CV LAB;  Service: Cardiovascular;  Laterality: N/A;   LEFT HEART CATH AND CORONARY ANGIOGRAPHY N/A 04/16/2020   Procedure: LEFT HEART CATH AND CORONARY ANGIOGRAPHY;  Surgeon: Lennette Bihari, MD;  Location: [facility] INVASIVE CV LAB;  Service: Cardiovascular;  Laterality: N/A;   Lumbosacral spine surgery     SPINAL CORD STIMULATOR IMPLANT     Family History Family History  Problem Relation Age of Onset   Cancer Mother    Heart attack Father    Heart attack Brother    Colon cancer Neg Hx     Social History Social History   Tobacco Use   Smoking status: Former    Current packs/day: 0.00    Types: Cigarettes    Quit date: 04/16/2020    Years since quitting: 3.2    Passive exposure: Never   Smokeless tobacco: Never  Vaping Use   Vaping status: Never Used  Substance Use Topics   Alcohol use: No    Alcohol/week: 0.0 standard drinks of alcohol   Drug use: No   Allergies Patient has no known allergies.  Review of Systems Review of Systems  Constitutional:  Negative for chills and fever.  HENT:  Negative for ear pain and sore throat.   Eyes:  Negative for pain and visual disturbance.  Respiratory:  Positive for shortness of breath. Negative for cough and chest tightness.   Cardiovascular:  Positive for chest pain. Negative for palpitations.  Gastrointestinal:  Positive for abdominal pain and nausea. Negative for vomiting.  Genitourinary:  Negative for dysuria and hematuria.  Musculoskeletal:  Positive for arthralgias. Negative for back pain.  Skin:  Negative for color change and rash.  Neurological:  Negative for seizures and syncope.  All other systems reviewed and are negative.   Physical Exam Vital Signs  I have  reviewed the triage vital signs BP 138/78   Pulse 76   Temp 98.5 F (36.9 C)   Resp 10   Ht 5\' 6"  (1.676  m)   Wt 79.8 kg   SpO2 97%   BMI 28.41 kg/m  Physical Exam Vitals and nursing note reviewed.  Constitutional:      General: He is not in acute distress.    Appearance: Normal appearance. He is well-developed. He is not ill-appearing.  HENT:     Head: Normocephalic and atraumatic.     Right Ear: External ear normal.     Left Ear: External ear normal.     Mouth/Throat:     Mouth: Mucous membranes are moist.  Eyes:     General: No scleral icterus. Cardiovascular:     Rate and Rhythm: Normal rate and regular rhythm.     Pulses: Normal pulses.     Heart sounds: Normal heart sounds.  Pulmonary:     Effort: Pulmonary effort is normal. No respiratory distress.     Breath sounds: Normal breath sounds.  Abdominal:     General: Abdomen is flat.     Palpations: Abdomen is soft.     Tenderness: There is abdominal tenderness in the epigastric area and left lower quadrant.       Comments: Not peritoneal   Musculoskeletal:     Cervical back: No rigidity.     Right lower leg: No edema.     Left lower leg: No edema.  Skin:    General: Skin is warm and dry.     Capillary Refill: Capillary refill takes less than 2 seconds.     Comments: Abrasions noted to B/L LE   Neurological:     Mental Status: He is alert.  Psychiatric:        Mood and Affect: Mood normal.        Behavior: Behavior normal.     ED Results and Treatments Labs (all labs ordered are listed, but only abnormal results are displayed) Labs Reviewed  CBC WITH DIFFERENTIAL/PLATELET - Abnormal; Notable for the following components:      Result Value   WBC 15.6 (*)    Neutro Abs 14.5 (*)    Lymphs Abs 0.5 (*)    All other components within normal limits  COMPREHENSIVE METABOLIC PANEL - Abnormal; Notable for the following components:   Potassium 2.9 (*)    Glucose, Bld 151 (*)    AST 402 (*)    ALT 286 (*)     Total Bilirubin 2.9 (*)    All other components within normal limits  LIPASE, BLOOD - Abnormal; Notable for the following components:   Lipase 930 (*)    All other components within normal limits  URINALYSIS, ROUTINE W REFLEX MICROSCOPIC - Abnormal; Notable for the following components:   Color, Urine AMBER (*)    Hgb urine dipstick SMALL (*)    All other components within normal limits  RESP PANEL BY RT-PCR (RSV, FLU A&B, COVID)  RVPGX2  BRAIN NATRIURETIC PEPTIDE  TROPONIN I (HIGH SENSITIVITY)  TROPONIN I (HIGH SENSITIVITY)  Radiology US Abdomen Limited RUQ (LIVER/GB)  Result Date: 07/29/2023 CLINICAL DATA:  Right upper quadrant pain EXAM: ULTRASOUND ABDOMEN LIMITED RIGHT UPPER QUADRANT COMPARISON:  CT earlier 07/29/2023 FINDINGS: Gallbladder: Distended gallbladder with layering stones. No wall thickening or adjacent fluid. Common bile duct: Diameter: 3 mm Liver: No focal lesion identified. Within normal limits in parenchymal echogenicity. Portal vein is patent on color Doppler imaging with normal direction of blood flow towards the liver. Other: None. IMPRESSION: Distended gallbladder with stones. No wall thickening or adjacent fluid. No ductal dilatation. Electronically Signed   By: Karen Kays M.D.   On: 07/29/2023 13:49   CT ABDOMEN PELVIS W CONTRAST  Result Date: 07/29/2023 CLINICAL DATA:  Left lower quadrant pain. EXAM: CT ABDOMEN AND PELVIS WITH CONTRAST TECHNIQUE: Multidetector CT imaging of the abdomen and pelvis was performed using the standard protocol following bolus administration of intravenous contrast. RADIATION DOSE REDUCTION: This exam was performed according to the departmental dose-optimization program which includes automated exposure control, adjustment of the mA and/or kV according to patient size and/or use of iterative reconstruction technique.  CONTRAST:  OMNIPAQUE IOHEXOL 300 MG/ML  SOLN COMPARISON:  CT 09/12/2016 FINDINGS: Lower chest: Breathing motion. There is some linear opacity at the bases likely scar or atelectasis. No pleural effusion. Coronary artery calcifications are seen. Hepatobiliary: Mild intrahepatic biliary duct ectasia. Unchanged from previous. The gallbladder is distended. Patent portal vein. Pancreas: Severe fatty infiltration of the pancreas. There is peripancreatic fat stranding identified with fluid tracking along the anterior perinephric spaces, left-greater-than-right. Please correlate for any history of pancreatitis. Spleen: Normal in size without focal abnormality. Splenic vein is patent. Adrenals/Urinary Tract: The adrenal glands are unremarkable. No enhancing renal mass. Bilateral parapelvic renal cysts are identified the ureters have normal course and caliber down to the bladder. The bladder has a preserved contour. Punctate nonobstructing lower pole right-sided renal stones. Once again there is a cystic lesion identified in the retroperitoneum just medial to the left adrenal gland, unchanged from previous measuring in the axial plane 3.3 by 2.1 cm on series 2, image 33. Again this could be a benign cystic lesion such as lymphangioma and has a demonstrated long-term stability. Stomach/Bowel: On this non oral contrast exam, large bowel is of normal course and caliber. Scattered diffuse colonic stool. Normal appendix in the right hemipelvis. The small bowel is nondilated. There is distal small bowel stool appearance, nonspecific. Moderate debris in the stomach. Vascular/Lymphatic: Aortic atherosclerosis. No enlarged abdominal or pelvic lymph nodes. Reproductive: Heterogeneous prostate. Slight mass effect along the base of the bladder. Other: No free intra-abdominal air. Small fat containing umbilical hernia. Musculoskeletal: Curvature and degenerative changes along the spine. Neurostimulator in place. Battery pack along  the right flank. Previous laminectomies identified as well at L4. IMPRESSION: Peripancreatic fat stranding with fluid tracking along the perinephric spaces, left-greater-than-right. Please correlate for clinical evidence of pancreatitis. Pancreas itself has diffuse fatty infiltration. No adjacent aggressive fluid collections. Patent portal vein and splenic vein Distended gallbladder. Normal appendix.  Scattered stool. Nonobstructing lower pole right-sided renal stones. Electronically Signed   By: Karen Kays M.D.   On: 07/29/2023 11:09   DG Chest 2 View  Result Date: 07/29/2023 CLINICAL DATA:  cp dib EXAM: CHEST - 2 VIEW COMPARISON:  CXR 11/10/16 FINDINGS: No pleural effusion. No pneumothorax. No focal airspace opacity. Normal cardiac and mediastinal contours. No radiographically apparent displaced rib fractures. Visualized upper abdomen is unremarkable. Vertebral body heights are maintained. Spinal nerve stimulator leads project over the midthoracic  spine. IMPRESSION: No focal airspace opacity. Electronically Signed   By: Lorenza Cambridge M.D.   On: 07/29/2023 09:01    Pertinent labs & imaging results that were available during my care of the patient were reviewed by me and considered in my medical decision making (see MDM for details).  Medications Ordered in ED Medications  famotidine (PEPCID) IVPB 20 mg premix (0 mg Intravenous Stopped 07/29/23 0944)  sodium chloride 0.9 % bolus 1,000 mL (0 mLs Intravenous Stopped 07/29/23 1055)  morphine (PF) 4 MG/ML injection 4 mg (4 mg Intravenous Given 07/29/23 0858)  ondansetron (ZOFRAN) injection 4 mg (4 mg Intravenous Given 07/29/23 0858)  iohexol (OMNIPAQUE) 300 MG/ML solution 100 mL (100 mLs Intravenous Contrast Given 07/29/23 0941)                                                                                                                                     Procedures Procedures  (including critical care time)  Medical Decision Making / ED  Course    Medical Decision Making:    ELDIE DANTONA is a 67 y.o. male with past medical history as below, significant for CAD, HTN, ichemic cardiomyopathy who presents to the ED with complaint of abd pain. . The complaint involves an extensive differential diagnosis and also carries with it a high risk of complications and morbidity.  Serious etiology was considered. Ddx includes but is not limited to: Differential includes all life-threatening causes for chest pain. This includes but is not exclusive to acute coronary syndrome, aortic dissection, pulmonary embolism, cardiac tamponade, community-acquired pneumonia, pericarditis, musculoskeletal chest wall pain, etc. Differential diagnosis includes but is not exclusive to acute cholecystitis, intrathoracic causes for epigastric abdominal pain, gastritis, duodenitis, pancreatitis, small bowel or large bowel obstruction, abdominal aortic aneurysm, hernia, gastritis, etc.   Complete initial physical exam performed, notably the patient  was NAD, abd soft / not peritoneal, HDS.    Reviewed and confirmed nursing documentation for past medical history, family history, social history.  Vital signs reviewed.    Clinical Course as of 07/29/23 1559  Wed Jul 29, 2023  1112 Lipase(!): 930 CT c/w pancreatitis  [SG]  1146 D/w pt in regards to findings, prefers to trial o/p management, he is tolerating PO, no etoh use or tobacco use [SG]  1242 Distended GB on imaging, pancreatitis, will get RUQ [place]  [SG]  1243 AST(!): 402 [SG]  1243 ALT(!): 286 [SG]  1243 Total Bilirubin(!): 2.9 Pancreatitis, distended GB, no jaundice; get [place] [SG]  1411 Paged gen surg [SG]  1416 Spoke with gen surg, reasonable to f/u in office [SG]    Clinical Course User Index [SG] Sloan Leiter, DO    Narrative: 67 yo male w/ hx as above here with abd pain/chest pain -Pain has improved since the onset -No longer having any dyspnea or chest pain; pain now primarily epigastrium and  LLQ -Lipase 930, WBC 15.6 -  Trop neg, EKG w/o STEMI, CXR stable > acs less likely -Workup consistent with acute pancreatitis, unclear etiology, no alcohol use, no gallstones on imaging although gallbladder is distended -Tolerant p.o. intake with difficulty, no ongoing nausea or vomiting, pain is well-controlled.  He follows with pain clinic -Spoke with gen surg, will see in office -Admission was recommended to the patient, he prefers to go home and follow-up with his PCP, reports that he has upcoming beach trip and does not want to miss it. -Continue taking his home analgesics, will add antiemetic -Strict return precautions encouraged -Tolerating po, no jaundice or fever, pain well controlled -Pt does not want to be admitted, would rather go home due to upcoming vacation -Close follow-up was encouraged advised to return for any worsening worrisome symptoms.  The patient improved significantly and was discharged in stable condition. Detailed discussions were had with the patient regarding current findings, and need for close f/u with PCP or on call doctor. The patient has been instructed to return immediately if the symptoms worsen in any way for re-evaluation. Patient verbalized understanding and is in agreement with current care plan. All questions answered prior to discharge.                Additional history obtained: -Additional history obtained from friend -External records from outside source obtained and reviewed including: Chart review including previous notes, labs, imaging, consultation notes including home meds, primary care documentation  ECHO 7/21 LVEF 50-55%, G1DD  Lab Tests: -I ordered, reviewed, and interpreted labs.   The pertinent results include:   Labs Reviewed  CBC WITH DIFFERENTIAL/PLATELET - Abnormal; Notable for the following components:      Result Value   WBC 15.6 (*)    Neutro Abs 14.5 (*)    Lymphs Abs 0.5 (*)    All other components within normal  limits  COMPREHENSIVE METABOLIC PANEL - Abnormal; Notable for the following components:   Potassium 2.9 (*)    Glucose, Bld 151 (*)    AST 402 (*)    ALT 286 (*)    Total Bilirubin 2.9 (*)    All other components within normal limits  LIPASE, BLOOD - Abnormal; Notable for the following components:   Lipase 930 (*)    All other components within normal limits  URINALYSIS, ROUTINE W REFLEX MICROSCOPIC - Abnormal; Notable for the following components:   Color, Urine AMBER (*)    Hgb urine dipstick SMALL (*)    All other components within normal limits  RESP PANEL BY RT-PCR (RSV, FLU A&B, COVID)  RVPGX2  BRAIN NATRIURETIC PEPTIDE  TROPONIN I (HIGH SENSITIVITY)  TROPONIN I (HIGH SENSITIVITY)    Notable for as above  EKG   EKG Interpretation Date/Time:  Wednesday July 29 2023 08:19:05 EDT Ventricular Rate:  84 PR Interval:  142 QRS Duration:  92 QT Interval:  347 QTC Calculation: 411 R Axis:   64  Text Interpretation: Sinus rhythm ?delta wave , similar to prior no stemi Reconfirmed by Tanda Rockers ([id number]) on 07/29/2023 8:52:51 AM         Imaging Studies ordered: I ordered imaging studies including CTAP, RUQ [place] I independently visualized the following imaging with scope of interpretation limited to determining acute life threatening conditions related to emergency care; findings noted above, significant for pancreatitis, cholelithiasis  I independently visualized and interpreted imaging. I agree with the radiologist interpretation   Medicines ordered and prescription drug management: Meds ordered this encounter  Medications   famotidine (PEPCID)  IVPB 20 mg premix   sodium chloride 0.9 % bolus 1,000 mL   morphine (PF) 4 MG/ML injection 4 mg   ondansetron (ZOFRAN) injection 4 mg   iohexol (OMNIPAQUE) 300 MG/ML solution 100 mL   ondansetron (ZOFRAN) 4 MG tablet    Sig: Take 1 tablet (4 mg total) by mouth every 4 (four) hours as needed for nausea or vomiting.    Dispense:   8 tablet    Refill:  0    -I have reviewed the patients home medicines and have made adjustments as needed   Consultations Obtained: I requested consultation with the Gen surg,  and discussed lab and imaging findings as well as pertinent plan - they recommend: f/u office   Cardiac Monitoring: The patient was maintained on a cardiac monitor.  I personally viewed and interpreted the cardiac monitored which showed an underlying rhythm of: NSR  Social Determinants of Health:  Diagnosis or treatment significantly limited by social determinants of health: former smoker   Reevaluation: After the interventions noted above, I reevaluated the patient and found that they have improved  Co morbidities that complicate the patient evaluation  Past Medical History:  Diagnosis Date   Anxiety    Basilar migraine 10/05/2013   CAD (coronary artery disease)    a. s/p NSTEMI in 04/2020 with DES x2 to LAD and residual 70% distal LAD stenosis   Chronic low back pain    Coronary artery disease    Hypertension    Ischemic cardiomyopathy    a. EF 35-40% by echo in 04/2020 with apical thrombus noted b. Apical thrombus resolved and EF improved to 50-55% by echo in 06/2020      Dispostion: Disposition decision including need for hospitalization was considered, and patient discharged from emergency department.    Final Clinical Impression(s) / ED Diagnoses Final diagnoses:  Acute pancreatitis, unspecified complication status, unspecified pancreatitis type  Hyperbilirubinemia  Transaminitis        Sloan Leiter, DO 07/29/23 1559

## 2023-07-29 NOTE — ED Triage Notes (Signed)
Pt c/o generalized chest and abdominal pain that started last night along with diaphoresis, lightheadedness, and SOB with exertion.

## 2023-08-27 ENCOUNTER — Ambulatory Visit: Payer: Medicare HMO | Admitting: Surgery

## 2023-08-27 ENCOUNTER — Encounter: Payer: Self-pay | Admitting: Surgery

## 2023-08-27 ENCOUNTER — Encounter: Payer: Self-pay | Admitting: *Deleted

## 2023-08-27 ENCOUNTER — Telehealth: Payer: Self-pay | Admitting: *Deleted

## 2023-08-27 VITALS — BP 129/80 | HR 67 | Temp 97.8°F | Resp 14 | Ht 66.0 in | Wt 180.0 lb

## 2023-08-27 DIAGNOSIS — I214 Non-ST elevation (NSTEMI) myocardial infarction: Secondary | ICD-10-CM | POA: Diagnosis not present

## 2023-08-27 DIAGNOSIS — K851 Biliary acute pancreatitis without necrosis or infection: Secondary | ICD-10-CM

## 2023-08-27 DIAGNOSIS — K8020 Calculus of gallbladder without cholecystitis without obstruction: Secondary | ICD-10-CM | POA: Diagnosis not present

## 2023-08-27 NOTE — Telephone Encounter (Signed)
   Pre-operative Risk Assessment    Patient Name: Jim Martin  DOB: [birth year] MRN: [id number]   LAST APPT:  04/15/23 NEXT APPT:  10/21/23   Request for Surgical Clearance    Procedure:   XI ROBOTIC ASSISTED LAP CHOLECYSTECTOMY  Date of Surgery:  Clearance TBD                                 Surgeon:  Theophilus Kinds, DO Surgeon's Group or Practice Name:  [facility] Phone number:  [phone] Fax number:  [phone]   Type of Clearance Requested:   - Medical  - Pharmacy:  Hold Aspirin NOT INDICATED HOW LONG   Type of Anesthesia:  General    Additional requests/questions:    Wilhemina Cash   08/27/2023, 11:32 AM

## 2023-08-28 NOTE — Progress Notes (Signed)
[facility] History and Physical  Reason for Referral: Gallstone pancreatitis Referring Physician: ED referral  Chief Complaint   New Patient (Initial Visit)     Jim Martin is a 67 y.o. male.  HPI: Patient is presenting for recent history of gallstone pancreatitis.  He started having epigastric and upper abdominal pain after going out to eat with his family.  He was having the pain for multiple days, so he presented to the emergency department for evaluation.  At that time, he was noted to have likely gallstone pancreatitis with CT imaging findings concerning for pancreatitis with distention of the gallbladder.  He did have a leukocytosis and mild elevation in his LFTs in addition to elevated lipase.  The patient stated that he did not want to be admitted to the hospital, and was given strict return precautions and discharged from the emergency department.  Since being discharged from the ED, he states that he has been doing okay in terms of pain control.  He currently denies any abdominal pain, nausea, or vomiting.  He believes he might of had a gallbladder attack prior to his heart attack in 2021.  His past medical history is significant for an NSTEMI in 2021 at which time he underwent 2 stents being placed and was on blood thinners for period of time thereafter.  He is currently only taking an 81 mg aspirin daily.  He also has chronic pain issues secondary to multiple back surgeries, for which he sees a pain specialist and has a pain contract.  His surgical history is significant for multiple back surgeries and a nerve stimulator.  He quit smoking in 2021, and previously was smoking 2 packs/day.  He denies use of alcohol and illicit drugs.  Past Medical History:  Diagnosis Date   Anxiety    Basilar migraine 10/05/2013   CAD (coronary artery disease)    a. s/p NSTEMI in 04/2020 with DES x2 to LAD and residual 70% distal LAD stenosis   Chronic low back pain    Coronary  artery disease    Hypertension    Ischemic cardiomyopathy    a. EF 35-40% by echo in 04/2020 with apical thrombus noted b. Apical thrombus resolved and EF improved to 50-55% by echo in 06/2020    Past Surgical History:  Procedure Laterality Date   BRAIN SURGERY     COLONOSCOPY  11/2009   Dr. Randa Evens: medium-sized internal hemorrhoids, normal    CORONARY STENT INTERVENTION N/A 04/16/2020   Procedure: CORONARY STENT INTERVENTION;  Surgeon: Lennette Bihari, MD;  Location: [facility] INVASIVE CV LAB;  Service: Cardiovascular;  Laterality: N/A;   LEFT HEART CATH AND CORONARY ANGIOGRAPHY N/A 04/16/2020   Procedure: LEFT HEART CATH AND CORONARY ANGIOGRAPHY;  Surgeon: Lennette Bihari, MD;  Location: [facility] INVASIVE CV LAB;  Service: Cardiovascular;  Laterality: N/A;   Lumbosacral spine surgery     SPINAL CORD STIMULATOR IMPLANT      Family History  Problem Relation Age of Onset   Cancer Mother    Heart attack Father    Heart attack Brother    Colon cancer Neg Hx     Social History   Tobacco Use   Smoking status: Former    Current packs/day: 0.00    Types: Cigarettes    Quit date: 04/16/2020    Years since quitting: 3.3    Passive exposure: Never   Smokeless tobacco: Never  Vaping Use   Vaping status: Never Used  Substance Use Topics  Alcohol use: No    Alcohol/week: 0.0 standard drinks of alcohol   Drug use: No    Medications: I have reviewed the patient's current medications. Allergies as of 08/27/2023   No Known Allergies      Medication List        Accurate as of August 27, 2023 11:59 PM. If you have any questions, ask your nurse or doctor.          amLODipine 10 MG tablet Commonly known as: NORVASC Take 10 mg by mouth at bedtime.   aspirin EC 81 MG tablet Take 1 tablet (81 mg total) by mouth daily. Swallow whole.   atorvastatin 40 MG tablet Commonly known as: LIPITOR TAKE (1) TABLET BY MOUTH ONCE A DAY..   DULoxetine 30 MG capsule Commonly known as:  CYMBALTA Take 30 mg by mouth daily.   famotidine 20 MG tablet Commonly known as: PEPCID Take 1 tablet (20 mg total) by mouth 2 (two) times daily.   folic acid 1 MG tablet Commonly known as: FOLVITE Take 1 mg by mouth daily.   gabapentin 300 MG capsule Commonly known as: NEURONTIN Take 300 mg by mouth 3 (three) times daily.   methocarbamol 750 MG tablet Commonly known as: ROBAXIN Take 750 mg by mouth 3 (three) times daily.   metoprolol succinate 50 MG 24 hr tablet Commonly known as: TOPROL-XL Take 1 tablet (50 mg total) by mouth daily.   nitroGLYCERIN 0.4 MG SL tablet Commonly known as: NITROSTAT Place 1 tablet (0.4 mg total) under the tongue every 5 (five) minutes as needed for chest pain (CP or SOB).   ondansetron 4 MG tablet Commonly known as: ZOFRAN Take 1 tablet (4 mg total) by mouth every 4 (four) hours as needed for nausea or vomiting.   oxyCODONE 15 MG immediate release tablet Commonly known as: ROXICODONE Take 15 mg by mouth 5 (five) times daily.   pantoprazole 40 MG tablet Commonly known as: PROTONIX Take 40 mg by mouth daily.   topiramate 100 MG tablet Commonly known as: TOPAMAX TAKE ONE TABLET BY MOUTH AT BEDTIME.   zolpidem 10 MG tablet Commonly known as: AMBIEN Take 10 mg by mouth at bedtime as needed for sleep.         ROS:  Constitutional: negative for chills, fatigue, and fevers Eyes: negative for visual disturbance and pain Ears, nose, mouth, throat, and face: negative for ear drainage, sore throat, and sinus problems Respiratory: negative for cough, wheezing, and shortness of breath Cardiovascular: negative for chest pain and palpitations Gastrointestinal: positive for abdominal pain, nausea, and reflux symptoms Genitourinary:negative for dysuria and frequency Integument/breast: negative for dryness and rash Hematologic/lymphatic: negative for bleeding and lymphadenopathy Musculoskeletal:positive for back pain and neck  pain Neurological: positive for dizziness, negative for tremors Endocrine: negative for temperature intolerance  Blood pressure 129/80, pulse 67, temperature 97.8 F (36.6 C), temperature source Oral, resp. rate 14, height 5\' 6"  (1.676 m), weight 180 lb (81.6 kg), SpO2 98%. Physical Exam Vitals reviewed.  Constitutional:      Appearance: Normal appearance.  HENT:     Head: Normocephalic and atraumatic.  Eyes:     Extraocular Movements: Extraocular movements intact.     Pupils: Pupils are equal, round, and reactive to light.  Cardiovascular:     Rate and Rhythm: Normal rate and regular rhythm.  Pulmonary:     Effort: Pulmonary effort is normal.     Breath sounds: Normal breath sounds.  Abdominal:     Comments: Abdomen  soft, nondistended, no percussion tenderness, nontender to palpation; no rigidity, guarding, rebound tenderness; nerve stimulator is in the right flank and should not interfere with any laparoscopic incision sites  Musculoskeletal:        General: Normal range of motion.     Cervical back: Normal range of motion.  Skin:    General: Skin is warm and dry.  Neurological:     General: No focal deficit present.     Mental Status: He is alert and oriented to person, place, and time.  Psychiatric:        Mood and Affect: Mood normal.        Behavior: Behavior normal.     Results:  CT of the abdomen and pelvis (07/29/2023): IMPRESSION: Peripancreatic fat stranding with fluid tracking along the perinephric spaces, left-greater-than-right. Please correlate for clinical evidence of pancreatitis. Pancreas itself has diffuse fatty infiltration. No adjacent aggressive fluid collections. Patent portal vein and splenic vein   Distended gallbladder.   Normal appendix.  Scattered stool.   Nonobstructing lower pole right-sided renal stones.  Abdominal ultrasound (07/29/2023): IMPRESSION: Distended gallbladder with stones. No wall thickening or adjacent fluid. No ductal  dilatation.   Assessment & Plan:  DYER HUIZAR is a 67 y.o. male who presents to discuss cholelithiasis and recent history of gallstone pancreatitis.  -I discussed the pathophysiology of gallbladder disease and we discussed the need for surgical removal of the gallbladder -I counseled the patient about the indication, risks and benefits of robotic assisted laparoscopic cholecystectomy.  He understands there is a very small chance for bleeding, infection, injury to normal structures (including common bile duct), conversion to open surgery, persistent symptoms, evolution of postcholecystectomy diarrhea, need for secondary interventions, anesthesia reaction, cardiopulmonary issues and other risks not specifically detailed here. I described the expected recovery, the plan for follow-up and the restrictions during the recovery phase.  All questions were answered. -Prior to scheduling the patient for surgery, I do want him to be cleared for surgery from a cardiac standpoint given his significant cardiovascular history -Information provided to the patient regarding cholelithiasis, cholecystitis, cholecystectomy, pancreatitis, and low-fat diet -Advised that the patient should present to the ED if they begin to have epigastric or right upper quadrant tenderness, fever, chills, nausea, and vomiting -Will also plan to get CMP with preoperative labs given mild elevation in LFTs and T. bili 1 evaluated in the emergency department in August   All questions were answered to the satisfaction of the patient.  Theophilus Kinds, DO [facility] [address] [place] [place], [place] [address] [phone] (office)

## 2023-09-02 NOTE — Telephone Encounter (Signed)
Lvm for patient to call back and make appointment

## 2023-09-02 NOTE — Telephone Encounter (Signed)
Ok to hold aspirin if needed for surgery  Dominga Ferry MD

## 2023-09-02 NOTE — Telephone Encounter (Signed)
Name: Jim Martin  DOB: 12-30-1955  MRN: [id number]  Primary Cardiologist: Dina Rich, MD  Chart reviewed as part of pre-operative protocol coverage. Because of Dennis R Mukherjee's past medical history and time since last visit, he will require a follow-up telephone visit in order to better assess preoperative cardiovascular risk.  Pre-op covering staff: - Please schedule appointment and call patient to inform them. If patient already had an upcoming appointment within acceptable timeframe, please add "pre-op clearance" to the appointment notes so provider is aware. - Please contact requesting surgeon's office via preferred method (i.e, phone, fax) to inform them of need for appointment prior to surgery.  Ok to hold aspirin 5-7 days if needed for surgery  -Dominga Ferry MD  Sharlene Dory, PA-C  09/02/2023, 4:04 PM

## 2023-09-07 NOTE — Telephone Encounter (Signed)
2nd attempt to reach pt to schedule tele visit. Lvm

## 2023-09-10 NOTE — Telephone Encounter (Signed)
Patient has upcoming appointment on 10/21/2023.

## 2023-09-10 NOTE — Telephone Encounter (Signed)
3rd attempt to reach patient Vm full will be removed from pool and Surgeon notified

## 2023-10-20 NOTE — Progress Notes (Deleted)
Cardiology Office Note    Date:  10/20/2023  ID:  Jim Martin, DOB Mar 16, 1956, MRN [id number] Cardiologist: Dina Rich, MD    History of Present Illness:    Jim Martin is a 67 y.o. male with past medical history of CAD (s/p NSTEMI in 04/2020 with DES x2 to LAD and residual 70% distal LAD stenosis), HFimpEF (EF 35-40% by echo in 04/2020, improved to 50-55% by echo in 06/2020), apical thrombus (by echo in 04/2020 and resolved on follow-up imaging), HTN, HLD, migraine headaches and prior tobacco use who presents to the office today for 87-month follow-up and preoperative cardiac clearance.  He was examined by myself in 04/2023 and reported activity was limited due to back pain but denied any specific anginal symptoms at that time. He was continued on ASA 81 mg daily, Atorvastatin 40 mg daily and Amlodipine 10 mg daily. Toprol-XL was increased from 25 mg daily to 50 mg daily given elevated BP. It was recommended if BP remained elevated, would add Losartan.   The office received a cardiac clearance request in 08/2023 for robotic assisted laparoscopic cholecystectomy. He was cleared by Dr. Wyline Mood to hold ASA if needed for his upcoming surgery.  Attempts were made to schedule a telephone visit in the interim for clearance but the office could not reach the patient for this.  ROS: ***  Studies Reviewed:   EKG: EKG is*** ordered today and demonstrates ***   EKG Interpretation Date/Time:    Ventricular Rate:    PR Interval:    QRS Duration:    QT Interval:    QTC Calculation:   R Axis:      Text Interpretation:         Cardiac Catheterization: 04/2020 Mid LAD-1 lesion is 75% stenosed. Mid LAD-2 lesion is 99% stenosed. Mid LAD to Dist LAD lesion is 95% stenosed. Dist LAD lesion is 70% stenosed. Post intervention, there is a 0% residual stenosis. Post intervention, there is a 0% residual stenosis. A stent was successfully placed. Post intervention, there is a 0% residual  stenosis.   Acute coronary syndrome/non-STEMI secondary to 3 tandem stenoses in a large LAD system with 70% proximal at site of ulcerated plaque, 99% in the proximal to mid and 95% eccentric mid stenosis, and 70% smooth distal LAD stenosis; relatively normal left circumflex system; RCA with 70% proximal and 20% mid stenoses.   LVEDP 21 mmHg.   Successful percutaneous coronary intervention of the tandem LAD lesions with ultimate insertion of a 2.75 x 38 mm stent from distal to proximal and 3.0 x 12 mm Resolute Onyx DES stent with stent overlap to cover the most proximal stenosis.  All stenoses were reduced to 0%.  There was brisk TIMI-3 flow.  There was no change in the distal 70% LAD stenosis.   RECOMMENDATION: DAPT for minimum of 1 year.  Smoking cessation is essential.  Aggressive lipid intervention with target LDL less than 70.  Optimal blood pressure control.  An echo Doppler study will be done to assess LV function.    Echocardiogram: 06/2020 IMPRESSIONS     1. Left ventricular ejection fraction, by estimation, is 50 to 55%. The  left ventricle has low normal function. The left ventricle demonstrates  mild global hypokinesis. Left ventricular diastolic parameters are  consistent with Grade I diastolic  dysfunction (impaired relaxation).   2. No definite LV mural thrombus with or without Definity contrast.   3. Right ventricular systolic function is normal. The right ventricular  size is normal. Tricuspid regurgitation signal is inadequate for assessing  PA pressure.   4. Left atrial size was upper normal.   5. The mitral valve is grossly normal. Mild mitral valve regurgitation.   6. The aortic valve is tricuspid. Aortic valve regurgitation is not  visualized.   7. The inferior vena cava is normal in size with greater than 50%  respiratory variability, suggesting right atrial pressure of 3 mmHg.    Event Monitor: 05/2021 Rare supraventricular ectopy Rare ventricular ectopy No  reported symptoms No significant arrhythmias     Patch Wear Time:  12 days and 6 hours (2022-05-12T16:26:29-0400 to 2022-05-24T22:56:57-0400)   Patient had a min HR of 42 bpm, max HR of 136 bpm, and avg HR of 64 bpm. Predominant underlying rhythm was Sinus Rhythm. Isolated SVEs were rare (<1.0%), SVE Couplets were rare (<1.0%), and SVE Triplets were rare (<1.0%). Isolated VEs were rare (<1.0%),  VE Couplets were rare (<1.0%), and no VE Triplets were present. Inverted QRS complexes possibly due to inverted placement of device.  Risk Assessment/Calculations:   {Does this patient have ATRIAL FIBRILLATION?:[phone]} No BP recorded.  {Refresh Note OR Click here to enter BP  :1}***         Physical Exam:   VS:  There were no vitals taken for this visit.   Wt Readings from Last 3 Encounters:  08/27/23 180 lb (81.6 kg)  07/29/23 176 lb (79.8 kg)  04/15/23 178 lb (80.7 kg)     GEN: Well nourished, well developed in no acute distress NECK: No JVD; No carotid bruits CARDIAC: ***RRR, no murmurs, rubs, gallops RESPIRATORY:  Clear to auscultation without rales, wheezing or rhonchi  ABDOMEN: Appears non-distended. No obvious abdominal masses. EXTREMITIES: No clubbing or cyanosis. No edema.  Distal pedal pulses are 2+ bilaterally.   Assessment and Plan:   1. CAD - He did have an NSTEMI in 04/2020 with DES x2 to LAD and residual 70% distal LAD stenosis which was similar to prior caths.   2. History of Cardiomyopathy - His EF was previously at 35 to 40% in 2021 and had normalized by repeat imaging. ***  3. HTN - ***  4. HLD - His LDL was at 42 when checked in 03/2023.  5. Preoperative Cardiac Clearance for Cholecystectomy - ***  Signed, Ellsworth Lennox, PA-C

## 2023-10-21 ENCOUNTER — Ambulatory Visit: Payer: Medicare HMO | Attending: Student | Admitting: Student

## 2023-10-22 ENCOUNTER — Encounter: Payer: Self-pay | Admitting: Student

## 2023-10-23 ENCOUNTER — Encounter: Payer: Self-pay | Admitting: Student

## 2024-06-29 NOTE — Progress Notes (Unsigned)
 Cardiology Office Note    Date:  06/30/2024  ID:  Jim Martin, DOB 1956-12-05, MRN [id number] Cardiologist: Alvan Carrier, MD    History of Present Illness:    Jim Martin is a 68 y.o. male  with past medical history of CAD (s/p NSTEMI in 04/2020 with DES x2 to LAD and residual 70% distal LAD stenosis), chronic HFimpEF (EF 35-40% by echo in 04/2020, improved to 50-55% by echo in 06/2020), apical thrombus (by echo in 04/2020 and resolved on follow-up imaging), HTN, HLD, migraine headaches and prior tobacco use who presents to the office today for overdue 55-month follow-up.  He was examined by myself in 04/2023 and reported activity was limited due to back pain but denied any specific anginal symptoms at that time. He was continued on ASA 81 mg daily, Atorvastatin  40 mg daily and Amlodipine  10 mg daily. Toprol -XL was increased from 25 mg daily to 50 mg daily given elevated BP. It was recommended if BP remained elevated, would add Losartan.    The office received a cardiac clearance request in 08/2023 for robotic assisted laparoscopic cholecystectomy. He was cleared by Dr. Alvan to hold ASA if needed for his upcoming surgery. Attempts were made to schedule a telephone visit in the interim for clearance but the office could not reach the patient for this. He has since no-showed for multiple visits.   In talking with the patient today, his biggest concern is worsening back pain. He reports having a spinal stimulator in place and wants to have this removed but has been told by his pain management clinic that this is typically not recommended. Reports having migraine headaches as well but is unable to have an MRI due to his spinal stimulator.  Says that he did not end up requiring cholecystectomy and denies any recurrent abdominal pain. From a cardiac perspective, he reports things have been stable. He did have an episode of chest pain over a month ago which lasted the entire day and he received  an injection for this by his PCP and pain resolved by the next day. Reports symptoms felt different from his prior MI. Denies any recurrent chest pain since. Breathing has been stable with no specific dyspnea on exertion, orthopnea, PND or pitting edema. He does walk around the grocery store and denies any anginal symptoms with this but his biggest limiting factor is his back pain.   Studies Reviewed:   EKG: EKG is ordered today and demonstrates:   EKG Interpretation Date/Time:  Thursday June 30 2024 14:07:43 EDT Ventricular Rate:  65 PR Interval:  164 QRS Duration:  94 QT Interval:  402 QTC Calculation: 418 R Axis:   28  Text Interpretation: Normal sinus rhythm Normal ECG When compared with ECG of 29-Jul-2023 08:19, PREVIOUS ECG IS PRESENT Confirmed by Johnson Grate ([id number]) on 06/30/2024 2:19:09 PM       Cardiac Catheterization: 04/2020 Mid LAD-1 lesion is 75% stenosed. Mid LAD-2 lesion is 99% stenosed. Mid LAD to Dist LAD lesion is 95% stenosed. Dist LAD lesion is 70% stenosed. Post intervention, there is a 0% residual stenosis. Post intervention, there is a 0% residual stenosis. A stent was successfully placed. Post intervention, there is a 0% residual stenosis.   Acute coronary syndrome/non-STEMI secondary to 3 tandem stenoses in a large LAD system with 70% proximal at site of ulcerated plaque, 99% in the proximal to mid and 95% eccentric mid stenosis, and 70% smooth distal LAD stenosis; relatively normal left circumflex system; RCA  with 70% proximal and 20% mid stenoses.   LVEDP 21 mmHg.   Successful percutaneous coronary intervention of the tandem LAD lesions with ultimate insertion of a 2.75 x 38 mm stent from distal to proximal and 3.0 x 12 mm Resolute Onyx DES stent with stent overlap to cover the most proximal stenosis.  All stenoses were reduced to 0%.  There was brisk TIMI-3 flow.  There was no change in the distal 70% LAD stenosis.   RECOMMENDATION: DAPT for  minimum of 1 year.  Smoking cessation is essential.  Aggressive lipid intervention with target LDL less than 70.  Optimal blood pressure control.  An echo Doppler study will be done to assess LV function.  Echocardiogram: 06/2020 IMPRESSIONS     1. Left ventricular ejection fraction, by estimation, is 50 to 55%. The  left ventricle has low normal function. The left ventricle demonstrates  mild global hypokinesis. Left ventricular diastolic parameters are  consistent with Grade I diastolic  dysfunction (impaired relaxation).   2. No definite LV mural thrombus with or without Definity  contrast.   3. Right ventricular systolic function is normal. The right ventricular  size is normal. Tricuspid regurgitation signal is inadequate for assessing  PA pressure.   4. Left atrial size was upper normal.   5. The mitral valve is grossly normal. Mild mitral valve regurgitation.   6. The aortic valve is tricuspid. Aortic valve regurgitation is not  visualized.   7. The inferior vena cava is normal in size with greater than 50%  respiratory variability, suggesting right atrial pressure of 3 mmHg.   Event Monitor: 05/2021 Rare supraventricular ectopy Rare ventricular ectopy No reported symptoms No significant arrhythmias     Patch Wear Time:  12 days and 6 hours (2022-05-12T16:26:29-0400 to 2022-05-24T22:56:57-0400)   Patient had a min HR of 42 bpm, max HR of 136 bpm, and avg HR of 64 bpm. Predominant underlying rhythm was Sinus Rhythm. Isolated SVEs were rare (<1.0%), SVE Couplets were rare (<1.0%), and SVE Triplets were rare (<1.0%). Isolated VEs were rare (<1.0%),  VE Couplets were rare (<1.0%), and no VE Triplets were present. Inverted QRS complexes possibly due to inverted placement of device.     Risk Assessment/Calculations:     HYPERTENSION CONTROL Vitals:   06/30/24 1404 06/30/24 1438  BP: (!) 150/94 (!) 146/92    The patient's blood pressure is elevated above target today.  In  order to address the patient's elevated BP: Blood pressure will be monitored at home to determine if medication changes need to be made.       Physical Exam:   VS:  BP (!) 146/92   Pulse 65   Ht 5' 6 (1.676 m)   Wt 184 lb (83.5 kg)   SpO2 97%   BMI 29.70 kg/m    Wt Readings from Last 3 Encounters:  06/30/24 184 lb (83.5 kg)  08/27/23 180 lb (81.6 kg)  07/29/23 176 lb (79.8 kg)     GEN: Well nourished, well developed male appearing in no acute distress NECK: No JVD; No carotid bruits CARDIAC: RRR, no murmurs, rubs, gallops RESPIRATORY:  Clear to auscultation without rales, wheezing or rhonchi  ABDOMEN: Appears non-distended. No obvious abdominal masses. EXTREMITIES: No clubbing or cyanosis. No pitting edema.  Distal pedal pulses are 2+ bilaterally.   Assessment and Plan:   1. CAD - He did have an NSTEMI in 04/2020 with DES x2 to LAD and residual 70% distal LAD stenosis which was similar to prior  caths.  - He reports having an episode of chest pain last month which overall seems atypical for a cardiac etiology as it lasted throughout the entire day and was not associated with exertion. Symptoms also resolved with injection of a pain medication. No recurrence since.  - Will continue ASA 81 mg daily, Atorvastatin  40 mg daily and Toprol -XL 50 mg daily. If he has recurrent symptoms which are more concerning for a cardiac etiology, could arrange for a follow-up NST.   2. History of Cardiomyopathy - His EF was previously at 35 to 40% in 2021 and had normalized by repeat imaging. He appears euvolemic by examination today and denies any recent respiratory issues. Continue Toprol -XL 50 mg daily.  No longer on diuretic therapy.   3. HTN - BP was initially recorded at 150/94, rechecked and at 146/92. He reports this has overall been well-controlled when checked at other office visits and he does feel like this is acutely elevated in the setting of back pain and his current migraine  headache. He was provided with a BP log and encouraged to return this in 1 to 2 months for reassessment.  For now, will continue current medical therapy with Amlodipine  10 mg daily and Toprol -XL 50 mg daily. If BP remains above goal, would plan to add an ARB.   4. HLD - His LDL was at 42 when checked in 03/2023. Will request a copy of most recent labs from his PCP. Continue current medical therapy with Atorvastatin  40 mg daily.  Signed, Laymon CHRISTELLA Qua, PA-C

## 2024-06-30 ENCOUNTER — Encounter: Payer: Self-pay | Admitting: *Deleted

## 2024-06-30 ENCOUNTER — Ambulatory Visit: Attending: Student | Admitting: Student

## 2024-06-30 ENCOUNTER — Encounter: Payer: Self-pay | Admitting: Student

## 2024-06-30 VITALS — BP 146/92 | HR 65 | Ht 66.0 in | Wt 184.0 lb

## 2024-06-30 DIAGNOSIS — I10 Essential (primary) hypertension: Secondary | ICD-10-CM | POA: Diagnosis not present

## 2024-06-30 DIAGNOSIS — I2510 Atherosclerotic heart disease of native coronary artery without angina pectoris: Secondary | ICD-10-CM | POA: Diagnosis not present

## 2024-06-30 DIAGNOSIS — Z8679 Personal history of other diseases of the circulatory system: Secondary | ICD-10-CM

## 2024-06-30 DIAGNOSIS — E785 Hyperlipidemia, unspecified: Secondary | ICD-10-CM

## 2024-06-30 NOTE — Patient Instructions (Signed)
 Medication Instructions:   Keep Blood pressure log and return in 1-2 months.   *If you need a refill on your cardiac medications before your next appointment, please call your pharmacy*  Follow-Up: At [facility], you and your health needs are our priority.  As part of our continuing mission to provide you with exceptional heart care, our providers are all part of one team.  This team includes your primary Cardiologist (physician) and Advanced Practice Providers or APPs (Physician Assistants and Nurse Practitioners) who all work together to provide you with the care you need, when you need it.  Your next appointment:   6 month(s)  Provider:   You may see Alvan Carrier, MD or one of the following Advanced Practice Providers on your designated Care Team:   Laymon Qua, PA-C  [place], [place] Olivia Pavy, [place]     We recommend signing up for the patient portal called MyChart.  Sign up information is provided on this After Visit Summary.  MyChart is used to connect with patients for Virtual Visits (Telemedicine).  Patients are able to view lab/test results, encounter notes, upcoming appointments, etc.  Non-urgent messages can be sent to your provider as well.   To learn more about what you can do with MyChart, go to [email or web address].

## 2024-07-01 ENCOUNTER — Encounter: Payer: Self-pay | Admitting: Internal Medicine
